# Patient Record
Sex: Female | Born: 1935 | Race: White | Hispanic: No | Smoking: Never smoker
Health system: Southern US, Community
[De-identification: ages and names within clinical notes are randomized; demographics above are authoritative.]

## PROBLEM LIST (undated history)

## (undated) DIAGNOSIS — L719 Rosacea, unspecified: Secondary | ICD-10-CM

## (undated) DIAGNOSIS — U071 COVID-19: Secondary | ICD-10-CM

## (undated) DIAGNOSIS — I2699 Other pulmonary embolism without acute cor pulmonale: Secondary | ICD-10-CM

## (undated) DIAGNOSIS — H9319 Tinnitus, unspecified ear: Secondary | ICD-10-CM

## (undated) DIAGNOSIS — N183 Chronic kidney disease, stage 3 unspecified: Secondary | ICD-10-CM

## (undated) DIAGNOSIS — F329 Major depressive disorder, single episode, unspecified: Secondary | ICD-10-CM

## (undated) DIAGNOSIS — F32A Depression, unspecified: Secondary | ICD-10-CM

## (undated) DIAGNOSIS — I10 Essential (primary) hypertension: Secondary | ICD-10-CM

## (undated) DIAGNOSIS — M8580 Other specified disorders of bone density and structure, unspecified site: Secondary | ICD-10-CM

## (undated) DIAGNOSIS — J984 Other disorders of lung: Secondary | ICD-10-CM

## (undated) DIAGNOSIS — K279 Peptic ulcer, site unspecified, unspecified as acute or chronic, without hemorrhage or perforation: Secondary | ICD-10-CM

## (undated) DIAGNOSIS — B029 Zoster without complications: Secondary | ICD-10-CM

## (undated) HISTORY — DX: Major depressive disorder, single episode, unspecified: F32.9

## (undated) HISTORY — DX: Peptic ulcer, site unspecified, unspecified as acute or chronic, without hemorrhage or perforation: K27.9

## (undated) HISTORY — DX: Tinnitus, unspecified ear: H93.19

## (undated) HISTORY — DX: Depression, unspecified: F32.A

## (undated) HISTORY — PX: OTHER SURGICAL HISTORY: SHX169

## (undated) HISTORY — DX: Chronic kidney disease, stage 3 unspecified: N18.30

## (undated) HISTORY — DX: Other specified disorders of bone density and structure, unspecified site: M85.80

## (undated) HISTORY — DX: Rosacea, unspecified: L71.9

## (undated) HISTORY — DX: Chronic kidney disease, stage 3 (moderate): N18.3

## (undated) HISTORY — DX: Other disorders of lung: J98.4

## (undated) HISTORY — DX: Zoster without complications: B02.9

---
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
## 1997-10-20 ENCOUNTER — Other Ambulatory Visit: Admission: RE | Admit: 1997-10-20 | Discharge: 1997-10-20 | Payer: Self-pay | Admitting: Internal Medicine

## 1997-10-21 ENCOUNTER — Other Ambulatory Visit: Admission: RE | Admit: 1997-10-21 | Discharge: 1997-10-21 | Payer: Self-pay | Admitting: Internal Medicine

## 1999-10-15 ENCOUNTER — Other Ambulatory Visit: Admission: RE | Admit: 1999-10-15 | Discharge: 1999-10-15 | Payer: Self-pay | Admitting: Internal Medicine

## 2001-01-27 ENCOUNTER — Ambulatory Visit (HOSPITAL_COMMUNITY): Admission: RE | Admit: 2001-01-27 | Discharge: 2001-01-27 | Payer: Self-pay | Admitting: Gastroenterology

## 2001-11-25 ENCOUNTER — Other Ambulatory Visit: Admission: RE | Admit: 2001-11-25 | Discharge: 2001-11-25 | Payer: Self-pay | Admitting: Internal Medicine

## 2003-10-17 ENCOUNTER — Emergency Department (HOSPITAL_COMMUNITY): Admission: EM | Admit: 2003-10-17 | Discharge: 2003-10-17 | Payer: Self-pay | Admitting: Emergency Medicine

## 2005-03-15 ENCOUNTER — Emergency Department (HOSPITAL_COMMUNITY): Admission: EM | Admit: 2005-03-15 | Discharge: 2005-03-16 | Payer: Self-pay | Admitting: Emergency Medicine

## 2011-07-30 DIAGNOSIS — R269 Unspecified abnormalities of gait and mobility: Secondary | ICD-10-CM | POA: Diagnosis not present

## 2011-07-30 DIAGNOSIS — I10 Essential (primary) hypertension: Secondary | ICD-10-CM | POA: Diagnosis not present

## 2011-07-30 DIAGNOSIS — M810 Age-related osteoporosis without current pathological fracture: Secondary | ICD-10-CM | POA: Diagnosis not present

## 2011-07-30 DIAGNOSIS — J84112 Idiopathic pulmonary fibrosis: Secondary | ICD-10-CM | POA: Diagnosis not present

## 2011-08-22 ENCOUNTER — Ambulatory Visit: Payer: Medicare Other | Attending: Internal Medicine | Admitting: Physical Therapy

## 2011-08-22 DIAGNOSIS — R279 Unspecified lack of coordination: Secondary | ICD-10-CM | POA: Diagnosis not present

## 2011-08-22 DIAGNOSIS — IMO0001 Reserved for inherently not codable concepts without codable children: Secondary | ICD-10-CM | POA: Diagnosis not present

## 2011-08-22 DIAGNOSIS — R262 Difficulty in walking, not elsewhere classified: Secondary | ICD-10-CM | POA: Insufficient documentation

## 2011-08-22 DIAGNOSIS — R5381 Other malaise: Secondary | ICD-10-CM | POA: Insufficient documentation

## 2011-08-26 ENCOUNTER — Ambulatory Visit: Payer: Medicare Other | Admitting: Physical Therapy

## 2011-08-28 ENCOUNTER — Ambulatory Visit: Payer: Medicare Other

## 2011-09-02 ENCOUNTER — Ambulatory Visit: Payer: Medicare Other | Attending: Internal Medicine

## 2011-09-02 DIAGNOSIS — R262 Difficulty in walking, not elsewhere classified: Secondary | ICD-10-CM | POA: Insufficient documentation

## 2011-09-02 DIAGNOSIS — IMO0001 Reserved for inherently not codable concepts without codable children: Secondary | ICD-10-CM | POA: Insufficient documentation

## 2011-09-02 DIAGNOSIS — R5381 Other malaise: Secondary | ICD-10-CM | POA: Insufficient documentation

## 2011-09-04 ENCOUNTER — Ambulatory Visit: Payer: Medicare Other

## 2011-09-04 DIAGNOSIS — IMO0001 Reserved for inherently not codable concepts without codable children: Secondary | ICD-10-CM | POA: Diagnosis not present

## 2011-09-04 DIAGNOSIS — R5381 Other malaise: Secondary | ICD-10-CM | POA: Diagnosis not present

## 2011-09-04 DIAGNOSIS — R262 Difficulty in walking, not elsewhere classified: Secondary | ICD-10-CM | POA: Diagnosis not present

## 2011-09-09 ENCOUNTER — Ambulatory Visit: Payer: Medicare Other

## 2011-09-09 DIAGNOSIS — IMO0001 Reserved for inherently not codable concepts without codable children: Secondary | ICD-10-CM | POA: Diagnosis not present

## 2011-09-09 DIAGNOSIS — R5381 Other malaise: Secondary | ICD-10-CM | POA: Diagnosis not present

## 2011-09-09 DIAGNOSIS — R262 Difficulty in walking, not elsewhere classified: Secondary | ICD-10-CM | POA: Diagnosis not present

## 2011-09-11 ENCOUNTER — Ambulatory Visit: Payer: Medicare Other

## 2011-09-11 DIAGNOSIS — R262 Difficulty in walking, not elsewhere classified: Secondary | ICD-10-CM | POA: Diagnosis not present

## 2011-09-11 DIAGNOSIS — R5381 Other malaise: Secondary | ICD-10-CM | POA: Diagnosis not present

## 2011-09-11 DIAGNOSIS — IMO0001 Reserved for inherently not codable concepts without codable children: Secondary | ICD-10-CM | POA: Diagnosis not present

## 2011-09-30 DIAGNOSIS — R21 Rash and other nonspecific skin eruption: Secondary | ICD-10-CM | POA: Diagnosis not present

## 2011-09-30 DIAGNOSIS — I10 Essential (primary) hypertension: Secondary | ICD-10-CM | POA: Diagnosis not present

## 2011-09-30 DIAGNOSIS — J84112 Idiopathic pulmonary fibrosis: Secondary | ICD-10-CM | POA: Diagnosis not present

## 2011-09-30 DIAGNOSIS — N183 Chronic kidney disease, stage 3 unspecified: Secondary | ICD-10-CM | POA: Diagnosis not present

## 2011-11-11 DIAGNOSIS — Z961 Presence of intraocular lens: Secondary | ICD-10-CM | POA: Diagnosis not present

## 2011-11-11 DIAGNOSIS — H52209 Unspecified astigmatism, unspecified eye: Secondary | ICD-10-CM | POA: Diagnosis not present

## 2011-11-11 DIAGNOSIS — H43819 Vitreous degeneration, unspecified eye: Secondary | ICD-10-CM | POA: Diagnosis not present

## 2011-11-11 DIAGNOSIS — H04129 Dry eye syndrome of unspecified lacrimal gland: Secondary | ICD-10-CM | POA: Diagnosis not present

## 2012-01-06 DIAGNOSIS — I10 Essential (primary) hypertension: Secondary | ICD-10-CM | POA: Diagnosis not present

## 2012-01-06 DIAGNOSIS — J84112 Idiopathic pulmonary fibrosis: Secondary | ICD-10-CM | POA: Diagnosis not present

## 2012-01-06 DIAGNOSIS — L719 Rosacea, unspecified: Secondary | ICD-10-CM | POA: Diagnosis not present

## 2012-03-14 ENCOUNTER — Other Ambulatory Visit: Payer: Self-pay

## 2012-03-14 ENCOUNTER — Encounter (HOSPITAL_COMMUNITY): Payer: Self-pay | Admitting: *Deleted

## 2012-03-14 ENCOUNTER — Emergency Department (HOSPITAL_COMMUNITY)
Admission: EM | Admit: 2012-03-14 | Discharge: 2012-03-14 | Disposition: A | Discharge status: Home / Self Care | Payer: Medicare Other | Attending: Emergency Medicine | Admitting: Emergency Medicine

## 2012-03-14 DIAGNOSIS — I4949 Other premature depolarization: Secondary | ICD-10-CM | POA: Insufficient documentation

## 2012-03-14 DIAGNOSIS — R42 Dizziness and giddiness: Secondary | ICD-10-CM | POA: Diagnosis not present

## 2012-03-14 DIAGNOSIS — I440 Atrioventricular block, first degree: Secondary | ICD-10-CM

## 2012-03-14 DIAGNOSIS — R5381 Other malaise: Secondary | ICD-10-CM | POA: Diagnosis not present

## 2012-03-14 DIAGNOSIS — I493 Ventricular premature depolarization: Secondary | ICD-10-CM

## 2012-03-14 DIAGNOSIS — R5383 Other fatigue: Secondary | ICD-10-CM | POA: Diagnosis not present

## 2012-03-14 HISTORY — DX: Essential (primary) hypertension: I10

## 2012-03-14 LAB — POCT I-STAT TROPONIN I: Troponin i, poc: 0 ng/mL (ref 0.00–0.08)

## 2012-03-14 LAB — CBC WITH DIFFERENTIAL/PLATELET
Basophils Absolute: 0 10*3/uL (ref 0.0–0.1)
Basophils Relative: 1 % (ref 0–1)
Eosinophils Relative: 1 % (ref 0–5)
Hemoglobin: 15.5 g/dL — ABNORMAL HIGH (ref 12.0–15.0)
Lymphs Abs: 1.4 10*3/uL (ref 0.7–4.0)
MCH: 32.2 pg (ref 26.0–34.0)
MCHC: 34.8 g/dL (ref 30.0–36.0)
MCV: 92.7 fL (ref 78.0–100.0)
Monocytes Absolute: 0.5 10*3/uL (ref 0.1–1.0)
Monocytes Relative: 8 % (ref 3–12)
RBC: 4.81 MIL/uL (ref 3.87–5.11)

## 2012-03-14 LAB — COMPREHENSIVE METABOLIC PANEL
ALT: 15 U/L (ref 0–35)
AST: 23 U/L (ref 0–37)
CO2: 27 mEq/L (ref 19–32)
Calcium: 10.3 mg/dL (ref 8.4–10.5)
Creatinine, Ser: 0.88 mg/dL (ref 0.50–1.10)
GFR calc Af Amer: 66 mL/min — ABNORMAL LOW (ref >90)
GFR calc non Af Amer: 57 mL/min — ABNORMAL LOW (ref >90)
Glucose, Bld: 114 mg/dL — ABNORMAL HIGH (ref 70–99)
Sodium: 142 mEq/L (ref 135–145)
Total Protein: 7.7 g/dL (ref 6.0–8.3)

## 2012-03-14 NOTE — ED Notes (Signed)
Patient sent to ED from [facility] for further evaluation irregular heart rate.  Patient states that she was sitting and when she got up she became dizzy and lightheaded.  Patient denies any SOB, CP, or diaphoresis.  Alert and oriented x 3.  Patient states that she just doesn't feel like herself.

## 2012-03-14 NOTE — ED Notes (Signed)
Pt denies slurred speech & weakness during dizzy episode, pt denies cardiac hx & does not take blood thinners, pt A&O x4, follows commands, speaks in complete sentences

## 2012-03-14 NOTE — ED Provider Notes (Signed)
History     CSN: [id number]  Arrival date & time 03/14/12  1724   First MD Initiated Contact with Patient 03/14/12 2017      Chief Complaint  Patient presents with  . Dizziness   Patient is 78 year old female with past medical history of hypertension who presents to emergency department with complaints of lightheadedness. Patient states around 9:30 this morning she got up from the chair, and after doing so she began having a sensation of lightheadedness. She denies any chest pain, shortness of breath, headache, blurry vision, or any other symptoms.  She reports sitting down and her lightheadedness resolved after approximately one hour. Brother came over to patient's house, and after hearing about event they decided to go to outpatient clinic. There EKG showed sinus Rhythm, with first degree AV block, occasional PVCs. She was sent to the emergency department for further evaluation.  Upon arrival in the emergency department patient continued to be without complaints.    (Consider location/radiation/quality/duration/timing/severity/associated sxs/prior treatment) Patient is a 78 y.o. female presenting with neurologic complaint. The history is provided by the patient and a relative. No language interpreter was used.  Neurologic Problem The primary symptoms include dizziness. The symptoms began 6 to 12 hours ago. The symptoms are resolved.  She describes the dizziness as lightheadedness. The dizziness began today. The dizziness has been resolved since its onset.  Medical issues also include hypertension. Medical issues do not include seizures, cancer, alcohol use or recent surgery. Workup history does not include MRI or CT scan.    Past Medical History  Diagnosis Date  . Hypertension     History reviewed. No pertinent past surgical history.  History reviewed. No pertinent family history.  History  Substance Use Topics  . Smoking status: Never Smoker   . Smokeless tobacco: Not on file    . Alcohol Use: No    OB History    Grav Para Term Preterm Abortions TAB SAB Ect Mult Living                  Review of Systems  Neurological: Positive for dizziness.  All other systems reviewed and are negative.    Allergies  Review of patient's allergies indicates no known allergies.  Home Medications   Current Outpatient Rx  Name Route Sig Dispense Refill  . AMLODIPINE BESYLATE PO Oral Take 1 tablet by mouth daily.    . ATORVASTATIN CALCIUM 40 MG PO TABS Oral Take 40 mg by mouth daily.    Marland Kitchen CALCIUM PO Oral Take 1 tablet by mouth daily.    Marland Kitchen VITAMIN D PO Oral Take 1 tablet by mouth daily.    . CENTRUM SILVER ADULT 50+ PO TABS Oral Take 1 tablet by mouth daily.      BP 170/80  Pulse 60  Temp 97.6 F (36.4 C) (Oral)  Resp 16  SpO2 97%  Physical Exam  Nursing note and vitals reviewed. Constitutional: She is oriented to person, place, and time. She appears well-developed and well-nourished. No distress.  HENT:  Head: Normocephalic and atraumatic.  Right Ear: External ear normal.  Left Ear: External ear normal.  Nose: Nose normal.  Mouth/Throat: Oropharynx is clear and moist.  Eyes: Conjunctivae normal and EOM are normal. Pupils are equal, round, and reactive to light.  Neck: Normal range of motion. Neck supple. No JVD present. No tracheal deviation present. No thyromegaly present.  Cardiovascular: Normal rate, regular rhythm, normal heart sounds and intact distal pulses.   Pulmonary/Chest: Effort normal  and breath sounds normal. No stridor. No respiratory distress. She has no wheezes. She has no rales. She exhibits no tenderness.  Abdominal: Soft.  Musculoskeletal: Normal range of motion. She exhibits no edema and no tenderness.  Lymphadenopathy:    She has no cervical adenopathy.  Neurological: She is alert and oriented to person, place, and time. She has normal reflexes.  Skin: Skin is warm and dry.  Psychiatric: She has a normal mood and affect.    ED  Course  Procedures (including critical care time)  Labs Reviewed  CBC WITH DIFFERENTIAL - Abnormal; Notable for the following:    Hemoglobin 15.5 (*)     All other components within normal limits  COMPREHENSIVE METABOLIC PANEL - Abnormal; Notable for the following:    Glucose, Bld 114 (*)     GFR calc non Af Amer 57 (*)     GFR calc Af Amer 66 (*)     All other components within normal limits  POCT I-STAT TROPONIN I   No results found.   Date: 03/14/2012  Rate: 64  Rhythm: normal sinus rhythm  QRS Axis: normal  Intervals: PR prolonged  ST/T Wave abnormalities: normal  Conduction Disutrbances:first-degree A-V block   Narrative Interpretation:   Old EKG Reviewed: On 03/15/2005 EKG shows occasional PVC but none on today's   1. Lightheadedness   2. First degree AV block   3. PVC's (premature ventricular contractions)       MDM    Patient is 78 year old female with past medical history of hypertension who presents to emergency department with one episode of lightheadedness. Of note, patient was seen in clinic and the outside EKG showed PVCs and thus she was sent to the emergency department.  Upon arrival in the emergency department patient was without symptoms.  Afebrile and vital signs remarkable only for mildly elevated blood pressure of 173/92.  On exam patient overall appeared well, well hydrated, and no focal neurological deficits.  Able to ambulate without difficulty.  Review of labs obtained in triage including troponin within normal limits. EKG here shows normal sinus rhythm with first degree AV block. No PVCs were noted. With patient being currently asymptomatic, unremarkable vital signs, and reassuring physiccal exam it was felt she was safe for discharge with PCP followup. Patient sees Dr. Sharlyn Bologna, and she will see him as soon as able for follow up.        Johnney Ou, MD 03/15/12 [phone]

## 2012-03-14 NOTE — ED Provider Notes (Signed)
Patient complains of general malaise from 9 AM to 10 AM today. Denied shortness of breath nausea sweatiness or headache she is presently asymptomatic on exam no distress alert lungs clear auscultation heart regular rate and rhythm abdomen nondistended neurologic Glasgow Coma Score 15 moves all extremities gait normal cranial nerves II through XII intact  Doug Sou, MD 03/14/12 2056

## 2012-03-15 NOTE — ED Provider Notes (Signed)
I have personally seen and examined the patient.  I have discussed the plan of care with the resident.  I have reviewed the documentation on PMH/FH/Soc. History.  I have reviewed the documentation of the resident and agree.  Jamee Keach, MD 03/15/12 1457 

## 2012-03-18 DIAGNOSIS — R42 Dizziness and giddiness: Secondary | ICD-10-CM | POA: Diagnosis not present

## 2012-04-14 DIAGNOSIS — Z23 Encounter for immunization: Secondary | ICD-10-CM | POA: Diagnosis not present

## 2012-05-05 DIAGNOSIS — R269 Unspecified abnormalities of gait and mobility: Secondary | ICD-10-CM | POA: Diagnosis not present

## 2012-05-05 DIAGNOSIS — I10 Essential (primary) hypertension: Secondary | ICD-10-CM | POA: Diagnosis not present

## 2012-05-05 DIAGNOSIS — Z1331 Encounter for screening for depression: Secondary | ICD-10-CM | POA: Diagnosis not present

## 2012-05-05 DIAGNOSIS — N183 Chronic kidney disease, stage 3 unspecified: Secondary | ICD-10-CM | POA: Diagnosis not present

## 2012-05-05 DIAGNOSIS — Z0000 Encounter for general adult medical examination without abnormal findings: Secondary | ICD-10-CM | POA: Diagnosis not present

## 2012-09-07 DIAGNOSIS — R269 Unspecified abnormalities of gait and mobility: Secondary | ICD-10-CM | POA: Diagnosis not present

## 2012-09-07 DIAGNOSIS — I10 Essential (primary) hypertension: Secondary | ICD-10-CM | POA: Diagnosis not present

## 2012-09-14 ENCOUNTER — Encounter: Payer: Federal, State, Local not specified - PPO | Admitting: Physical Therapy

## 2012-09-15 ENCOUNTER — Ambulatory Visit: Payer: Medicare Other | Admitting: Physical Therapy

## 2012-09-23 ENCOUNTER — Ambulatory Visit: Payer: Medicare Other | Attending: Internal Medicine | Admitting: Physical Therapy

## 2012-09-23 DIAGNOSIS — IMO0001 Reserved for inherently not codable concepts without codable children: Secondary | ICD-10-CM | POA: Diagnosis not present

## 2012-09-23 DIAGNOSIS — R269 Unspecified abnormalities of gait and mobility: Secondary | ICD-10-CM | POA: Diagnosis not present

## 2012-09-28 ENCOUNTER — Ambulatory Visit: Payer: Medicare Other | Admitting: Physical Therapy

## 2012-09-28 DIAGNOSIS — R269 Unspecified abnormalities of gait and mobility: Secondary | ICD-10-CM | POA: Diagnosis not present

## 2012-09-28 DIAGNOSIS — IMO0001 Reserved for inherently not codable concepts without codable children: Secondary | ICD-10-CM | POA: Diagnosis not present

## 2012-09-30 ENCOUNTER — Ambulatory Visit: Payer: Medicare Other | Attending: Internal Medicine | Admitting: Physical Therapy

## 2012-09-30 DIAGNOSIS — R269 Unspecified abnormalities of gait and mobility: Secondary | ICD-10-CM | POA: Diagnosis not present

## 2012-09-30 DIAGNOSIS — IMO0001 Reserved for inherently not codable concepts without codable children: Secondary | ICD-10-CM | POA: Insufficient documentation

## 2012-10-05 ENCOUNTER — Ambulatory Visit: Payer: Medicare Other | Admitting: Physical Therapy

## 2012-10-05 DIAGNOSIS — R269 Unspecified abnormalities of gait and mobility: Secondary | ICD-10-CM | POA: Diagnosis not present

## 2012-10-05 DIAGNOSIS — IMO0001 Reserved for inherently not codable concepts without codable children: Secondary | ICD-10-CM | POA: Diagnosis not present

## 2012-10-09 ENCOUNTER — Ambulatory Visit: Payer: Medicare Other | Admitting: Physical Therapy

## 2012-10-09 DIAGNOSIS — IMO0001 Reserved for inherently not codable concepts without codable children: Secondary | ICD-10-CM | POA: Diagnosis not present

## 2012-10-09 DIAGNOSIS — R269 Unspecified abnormalities of gait and mobility: Secondary | ICD-10-CM | POA: Diagnosis not present

## 2012-10-12 ENCOUNTER — Ambulatory Visit: Payer: Medicare Other | Admitting: Physical Therapy

## 2012-10-12 DIAGNOSIS — R269 Unspecified abnormalities of gait and mobility: Secondary | ICD-10-CM | POA: Diagnosis not present

## 2012-10-12 DIAGNOSIS — IMO0001 Reserved for inherently not codable concepts without codable children: Secondary | ICD-10-CM | POA: Diagnosis not present

## 2012-10-14 ENCOUNTER — Ambulatory Visit: Payer: Medicare Other | Admitting: Physical Therapy

## 2012-10-14 DIAGNOSIS — R269 Unspecified abnormalities of gait and mobility: Secondary | ICD-10-CM | POA: Diagnosis not present

## 2012-10-14 DIAGNOSIS — IMO0001 Reserved for inherently not codable concepts without codable children: Secondary | ICD-10-CM | POA: Diagnosis not present

## 2012-10-19 ENCOUNTER — Ambulatory Visit: Payer: Medicare Other | Admitting: Physical Therapy

## 2012-10-19 DIAGNOSIS — R269 Unspecified abnormalities of gait and mobility: Secondary | ICD-10-CM | POA: Diagnosis not present

## 2012-10-19 DIAGNOSIS — IMO0001 Reserved for inherently not codable concepts without codable children: Secondary | ICD-10-CM | POA: Diagnosis not present

## 2012-10-20 DIAGNOSIS — Z1231 Encounter for screening mammogram for malignant neoplasm of breast: Secondary | ICD-10-CM | POA: Diagnosis not present

## 2012-10-20 DIAGNOSIS — Z803 Family history of malignant neoplasm of breast: Secondary | ICD-10-CM | POA: Diagnosis not present

## 2012-10-21 ENCOUNTER — Encounter: Payer: Federal, State, Local not specified - PPO | Admitting: Physical Therapy

## 2012-11-03 DIAGNOSIS — I472 Ventricular tachycardia: Secondary | ICD-10-CM | POA: Diagnosis not present

## 2012-11-03 DIAGNOSIS — E039 Hypothyroidism, unspecified: Secondary | ICD-10-CM | POA: Diagnosis not present

## 2012-11-03 DIAGNOSIS — I471 Supraventricular tachycardia: Secondary | ICD-10-CM | POA: Diagnosis not present

## 2012-11-03 DIAGNOSIS — R002 Palpitations: Secondary | ICD-10-CM | POA: Diagnosis not present

## 2012-11-03 DIAGNOSIS — I10 Essential (primary) hypertension: Secondary | ICD-10-CM | POA: Diagnosis not present

## 2012-11-03 DIAGNOSIS — R55 Syncope and collapse: Secondary | ICD-10-CM | POA: Diagnosis not present

## 2012-11-03 DIAGNOSIS — I499 Cardiac arrhythmia, unspecified: Secondary | ICD-10-CM | POA: Diagnosis not present

## 2012-11-13 DIAGNOSIS — H52209 Unspecified astigmatism, unspecified eye: Secondary | ICD-10-CM | POA: Diagnosis not present

## 2012-11-13 DIAGNOSIS — H43819 Vitreous degeneration, unspecified eye: Secondary | ICD-10-CM | POA: Diagnosis not present

## 2012-11-13 DIAGNOSIS — Z961 Presence of intraocular lens: Secondary | ICD-10-CM | POA: Diagnosis not present

## 2012-12-07 DIAGNOSIS — I471 Supraventricular tachycardia: Secondary | ICD-10-CM | POA: Diagnosis not present

## 2012-12-07 DIAGNOSIS — N183 Chronic kidney disease, stage 3 unspecified: Secondary | ICD-10-CM | POA: Diagnosis not present

## 2012-12-07 DIAGNOSIS — I10 Essential (primary) hypertension: Secondary | ICD-10-CM | POA: Diagnosis not present

## 2012-12-07 DIAGNOSIS — J84112 Idiopathic pulmonary fibrosis: Secondary | ICD-10-CM | POA: Diagnosis not present

## 2013-03-20 DIAGNOSIS — Z23 Encounter for immunization: Secondary | ICD-10-CM | POA: Diagnosis not present

## 2013-05-20 DIAGNOSIS — E782 Mixed hyperlipidemia: Secondary | ICD-10-CM | POA: Diagnosis not present

## 2013-05-20 DIAGNOSIS — I10 Essential (primary) hypertension: Secondary | ICD-10-CM | POA: Diagnosis not present

## 2013-05-20 DIAGNOSIS — J84112 Idiopathic pulmonary fibrosis: Secondary | ICD-10-CM | POA: Diagnosis not present

## 2013-05-20 DIAGNOSIS — I471 Supraventricular tachycardia: Secondary | ICD-10-CM | POA: Diagnosis not present

## 2013-05-20 DIAGNOSIS — N183 Chronic kidney disease, stage 3 unspecified: Secondary | ICD-10-CM | POA: Diagnosis not present

## 2013-05-20 DIAGNOSIS — M810 Age-related osteoporosis without current pathological fracture: Secondary | ICD-10-CM | POA: Diagnosis not present

## 2013-05-20 DIAGNOSIS — Z0000 Encounter for general adult medical examination without abnormal findings: Secondary | ICD-10-CM | POA: Diagnosis not present

## 2013-05-20 DIAGNOSIS — Z1331 Encounter for screening for depression: Secondary | ICD-10-CM | POA: Diagnosis not present

## 2013-09-01 DIAGNOSIS — M545 Low back pain, unspecified: Secondary | ICD-10-CM | POA: Diagnosis not present

## 2013-09-10 ENCOUNTER — Other Ambulatory Visit: Payer: Self-pay | Admitting: Internal Medicine

## 2013-09-10 ENCOUNTER — Ambulatory Visit
Admission: RE | Admit: 2013-09-10 | Discharge: 2013-09-10 | Disposition: A | Discharge status: Home / Self Care | Payer: Medicare Other | Source: Ambulatory Visit | Attending: Internal Medicine | Admitting: Internal Medicine

## 2013-09-10 DIAGNOSIS — M545 Low back pain, unspecified: Secondary | ICD-10-CM

## 2013-09-10 DIAGNOSIS — M4120 Other idiopathic scoliosis, site unspecified: Secondary | ICD-10-CM | POA: Diagnosis not present

## 2013-09-10 DIAGNOSIS — M5137 Other intervertebral disc degeneration, lumbosacral region: Secondary | ICD-10-CM | POA: Diagnosis not present

## 2013-09-20 ENCOUNTER — Other Ambulatory Visit: Payer: Self-pay | Admitting: Internal Medicine

## 2013-09-20 DIAGNOSIS — J84112 Idiopathic pulmonary fibrosis: Secondary | ICD-10-CM | POA: Diagnosis not present

## 2013-09-20 DIAGNOSIS — M810 Age-related osteoporosis without current pathological fracture: Secondary | ICD-10-CM | POA: Diagnosis not present

## 2013-09-20 DIAGNOSIS — IMO0002 Reserved for concepts with insufficient information to code with codable children: Secondary | ICD-10-CM | POA: Diagnosis not present

## 2013-09-20 DIAGNOSIS — I10 Essential (primary) hypertension: Secondary | ICD-10-CM | POA: Diagnosis not present

## 2013-09-20 DIAGNOSIS — M545 Low back pain, unspecified: Secondary | ICD-10-CM

## 2013-09-20 DIAGNOSIS — N183 Chronic kidney disease, stage 3 unspecified: Secondary | ICD-10-CM | POA: Diagnosis not present

## 2013-09-20 DIAGNOSIS — S32009A Unspecified fracture of unspecified lumbar vertebra, initial encounter for closed fracture: Secondary | ICD-10-CM | POA: Diagnosis not present

## 2013-09-24 ENCOUNTER — Ambulatory Visit
Admission: RE | Admit: 2013-09-24 | Discharge: 2013-09-24 | Disposition: A | Discharge status: Home / Self Care | Payer: Medicare Other | Source: Ambulatory Visit | Attending: Internal Medicine | Admitting: Internal Medicine

## 2013-09-24 DIAGNOSIS — S322XXA Fracture of coccyx, initial encounter for closed fracture: Secondary | ICD-10-CM | POA: Diagnosis not present

## 2013-09-24 DIAGNOSIS — S3210XA Unspecified fracture of sacrum, initial encounter for closed fracture: Secondary | ICD-10-CM | POA: Diagnosis not present

## 2013-09-24 DIAGNOSIS — M545 Low back pain, unspecified: Secondary | ICD-10-CM

## 2013-11-04 DIAGNOSIS — M810 Age-related osteoporosis without current pathological fracture: Secondary | ICD-10-CM | POA: Diagnosis not present

## 2013-11-06 ENCOUNTER — Other Ambulatory Visit: Payer: Self-pay | Admitting: Interventional Cardiology

## 2013-12-06 ENCOUNTER — Encounter: Payer: Self-pay | Admitting: Cardiology

## 2013-12-06 ENCOUNTER — Ambulatory Visit (INDEPENDENT_AMBULATORY_CARE_PROVIDER_SITE_OTHER): Payer: Medicare Other | Admitting: Interventional Cardiology

## 2013-12-06 ENCOUNTER — Encounter: Payer: Self-pay | Admitting: Interventional Cardiology

## 2013-12-06 VITALS — BP 168/81 | HR 72 | Ht 60.0 in | Wt 130.8 lb

## 2013-12-06 DIAGNOSIS — I10 Essential (primary) hypertension: Secondary | ICD-10-CM | POA: Insufficient documentation

## 2013-12-06 DIAGNOSIS — R609 Edema, unspecified: Secondary | ICD-10-CM

## 2013-12-06 DIAGNOSIS — I471 Supraventricular tachycardia: Secondary | ICD-10-CM

## 2013-12-06 DIAGNOSIS — R55 Syncope and collapse: Secondary | ICD-10-CM

## 2013-12-06 MED ORDER — METOPROLOL TARTRATE 25 MG PO TABS: ORAL_TABLET | ORAL | Status: DC

## 2013-12-06 NOTE — Progress Notes (Signed)
Patient ID: Madeline Parrish, female   DOB: Nov 14, 1921, 80 y.o.   MRN: [id number]    [address] [place], [place]  [address] Phone: [phone] Fax:  [phone]  Date:  12/06/2013   ID:  Madeline Parrish, DOB [birth year], MRN [id number]  PCP:  Darnelle Bos, MD      History of Present Illness: Madeline Parrish is a 80 y.o. female who has a history of questionable SVT in the past.  In 2014, She was having palpitations and her primary care doctor sent her up for Holter monitor. When being hooked up with a Holter monitor, it was noted that her heart rate was 170. An ECG was performed which revealed a regular tachycardia with a heart rate in the 170s. The patient could feel her fast heartbeat. She denied any chest pain. She was not really short of breath. Given that she would not likely tolerate this heart rate for a prolonged period of time, we elected to have her sent to the emergency room. She got to the EMS stretcher and then while still in the office, felt markedly better. Repeat ECG showed that her SVT had broken and she was now in a sinus tachycardia. We gave her 5 mg of by systolic and had her walk around the office. Her heart rate stayed in the normal range. She had no chest pain or shortness of breath. Overall she felt well at this episode described in 2014.  Since that time, she has done well on metoprolol.  No SVT.  She has fallen due to some wewakness, but not related to the HR.  She has LE edema.  It is worse since she has had some back pain.  Left is more swollen than right.    Wt Readings from Last 3 Encounters:  12/06/13 130 lb 12.8 oz (59.33 kg)     Past Medical History  Diagnosis Date  . Hypertension   . Shingles   . Depression   . PUD (peptic ulcer disease)   . Acne rosacea   . Osteopenia   . Tinnitus     and vertigo  . CKD (chronic kidney disease), stage III   . Restrictive lung disease     Current Outpatient Prescriptions  Medication  Sig Dispense Refill  . AMLODIPINE BESYLATE PO Take 2.5 mg by mouth daily.       Marland Kitchen atorvastatin (LIPITOR) 40 MG tablet Take 40 mg by mouth daily.      Marland Kitchen CALCIUM PO Take 1 tablet by mouth daily.      . Cholecalciferol (VITAMIN D PO) Take 1,000 Units by mouth daily.       . clindamycin (CLEOCIN T) 1 % lotion Apply topically.       Marland Kitchen HYDROcodone-acetaminophen (NORCO/VICODIN) 5-325 MG per tablet Take 1 tablet by mouth every 6 (six) hours as needed.       Marland Kitchen lisinopril (PRINIVIL,ZESTRIL) 40 MG tablet Take 40 mg by mouth daily.       . metoprolol tartrate (LOPRESSOR) 25 MG tablet TAKE 1 TABLET TWICE DAILY.  60 tablet  11  . Multiple Vitamins-Minerals (CENTRUM SILVER ADULT 50+) TABS Take 1 tablet by mouth daily.      . Omega-3 Fatty Acids (FISH OIL) 1000 MG CAPS Take by mouth.       No current facility-administered medications for this visit.    Allergies:   No Known Allergies  Social History:  The patient  reports that she has never  smoked. She does not have any smokeless tobacco history on file. She reports that she does not drink alcohol.   Family History:  The patient's family history includes CVA in her mother.   ROS:  Please see the history of present illness.  No nausea, vomiting.  No fevers, chills.  No focal weakness.  No dysuria. Falls, swelling as above.   All other systems reviewed and negative.   PHYSICAL EXAM: VS:  BP 168/81  Pulse 72  Ht 5' (1.524 m)  Wt 130 lb 12.8 oz (59.33 kg)  BMI 25.54 kg/m2 Well nourished, well developed, in no acute distress HEENT: normal Neck: no JVD, no carotid bruits Cardiac:  normal S1, S2; RRR;  Lungs:  clear to auscultation bilaterally, no wheezing, rhonchi or rales Abd: soft, nontender, no hepatomegaly Ext: no edema Skin: warm and dry Neuro:   no focal abnormalities noted  EKG:  NSR with prolonged PR   ASSESSMENT AND PLAN:  SVT/PAT  Continue Metoprolol Tartrate Tablet, 25 MG, 1 tablet, Orally, Twice a day, 30 day(s), 60, Refills  11 Notes: SVT with significantly elevated heart rate. She tolerates this quite well but likely would not if it was prolonged. Started beta blocker as noted above. If she has recurrent symptoms, she will let us know and we'll likely up titrate her beta blocker.  2. Essential hypertension, benign  Notes: if we need to, will decrease lisinopril to allow for more rate slowing drugs. We'll try low-dose metoprolol and titrate as needed.  Check BP outside of doctor's office.  Target < 150/90. 3 Bilateral LE edema.  Elevate legs at night to help.  COuld consider diuretic or compression stockings in the future  Signed, Fredric MareJay S. Varanasi, MD, [facility] 12/06/2013 3:03 PM

## 2013-12-06 NOTE — Patient Instructions (Signed)
Your physician recommends that you continue on your current medications as directed. Please refer to the Current Medication list given to you today.  Your physician wants you to follow-up in: 1 year with Dr. Varanasi. You will receive a reminder letter in the mail two months in advance. If you don't receive a letter, please call our office to schedule the follow-up appointment.  

## 2013-12-23 DIAGNOSIS — M810 Age-related osteoporosis without current pathological fracture: Secondary | ICD-10-CM | POA: Diagnosis not present

## 2013-12-23 DIAGNOSIS — R609 Edema, unspecified: Secondary | ICD-10-CM | POA: Diagnosis not present

## 2013-12-23 DIAGNOSIS — J84112 Idiopathic pulmonary fibrosis: Secondary | ICD-10-CM | POA: Diagnosis not present

## 2013-12-23 DIAGNOSIS — I10 Essential (primary) hypertension: Secondary | ICD-10-CM | POA: Diagnosis not present

## 2014-04-16 DIAGNOSIS — Z23 Encounter for immunization: Secondary | ICD-10-CM | POA: Diagnosis not present

## 2014-05-23 DIAGNOSIS — Z0000 Encounter for general adult medical examination without abnormal findings: Secondary | ICD-10-CM | POA: Diagnosis not present

## 2014-05-23 DIAGNOSIS — Z136 Encounter for screening for cardiovascular disorders: Secondary | ICD-10-CM | POA: Diagnosis not present

## 2014-05-23 DIAGNOSIS — Z1389 Encounter for screening for other disorder: Secondary | ICD-10-CM | POA: Diagnosis not present

## 2014-07-04 DIAGNOSIS — H43813 Vitreous degeneration, bilateral: Secondary | ICD-10-CM | POA: Diagnosis not present

## 2014-07-04 DIAGNOSIS — Z961 Presence of intraocular lens: Secondary | ICD-10-CM | POA: Diagnosis not present

## 2014-07-04 DIAGNOSIS — H52203 Unspecified astigmatism, bilateral: Secondary | ICD-10-CM | POA: Diagnosis not present

## 2014-07-04 DIAGNOSIS — H04123 Dry eye syndrome of bilateral lacrimal glands: Secondary | ICD-10-CM | POA: Diagnosis not present

## 2014-09-20 DIAGNOSIS — J84112 Idiopathic pulmonary fibrosis: Secondary | ICD-10-CM | POA: Diagnosis not present

## 2014-09-20 DIAGNOSIS — N183 Chronic kidney disease, stage 3 (moderate): Secondary | ICD-10-CM | POA: Diagnosis not present

## 2014-09-20 DIAGNOSIS — I10 Essential (primary) hypertension: Secondary | ICD-10-CM | POA: Diagnosis not present

## 2015-01-13 ENCOUNTER — Other Ambulatory Visit: Payer: Self-pay | Admitting: Interventional Cardiology

## 2015-02-20 ENCOUNTER — Other Ambulatory Visit: Payer: Self-pay | Admitting: Interventional Cardiology

## 2015-03-09 ENCOUNTER — Ambulatory Visit: Payer: Federal, State, Local not specified - PPO | Admitting: Interventional Cardiology

## 2015-03-15 ENCOUNTER — Encounter: Payer: Self-pay | Admitting: Interventional Cardiology

## 2015-03-21 DIAGNOSIS — Z23 Encounter for immunization: Secondary | ICD-10-CM | POA: Diagnosis not present

## 2015-03-28 ENCOUNTER — Other Ambulatory Visit: Payer: Self-pay | Admitting: Interventional Cardiology

## 2015-05-04 ENCOUNTER — Other Ambulatory Visit: Payer: Self-pay | Admitting: Interventional Cardiology

## 2015-05-29 ENCOUNTER — Other Ambulatory Visit: Payer: Self-pay

## 2015-05-29 DIAGNOSIS — Z23 Encounter for immunization: Secondary | ICD-10-CM | POA: Diagnosis not present

## 2015-05-29 DIAGNOSIS — E039 Hypothyroidism, unspecified: Secondary | ICD-10-CM | POA: Diagnosis not present

## 2015-05-29 DIAGNOSIS — I471 Supraventricular tachycardia: Secondary | ICD-10-CM | POA: Diagnosis not present

## 2015-05-29 DIAGNOSIS — I10 Essential (primary) hypertension: Secondary | ICD-10-CM | POA: Diagnosis not present

## 2015-05-29 DIAGNOSIS — M810 Age-related osteoporosis without current pathological fracture: Secondary | ICD-10-CM | POA: Diagnosis not present

## 2015-05-29 DIAGNOSIS — Z1389 Encounter for screening for other disorder: Secondary | ICD-10-CM | POA: Diagnosis not present

## 2015-05-29 DIAGNOSIS — N183 Chronic kidney disease, stage 3 (moderate): Secondary | ICD-10-CM | POA: Diagnosis not present

## 2015-05-29 DIAGNOSIS — Z0000 Encounter for general adult medical examination without abnormal findings: Secondary | ICD-10-CM | POA: Diagnosis not present

## 2015-05-29 MED ORDER — METOPROLOL TARTRATE 25 MG PO TABS: 25.0000 mg | ORAL_TABLET | Freq: Two times a day (BID) | ORAL | Status: DC

## 2015-07-17 DIAGNOSIS — H52203 Unspecified astigmatism, bilateral: Secondary | ICD-10-CM | POA: Diagnosis not present

## 2015-07-17 DIAGNOSIS — H04123 Dry eye syndrome of bilateral lacrimal glands: Secondary | ICD-10-CM | POA: Diagnosis not present

## 2015-07-17 DIAGNOSIS — S0502XA Injury of conjunctiva and corneal abrasion without foreign body, left eye, initial encounter: Secondary | ICD-10-CM | POA: Diagnosis not present

## 2015-07-17 DIAGNOSIS — H43813 Vitreous degeneration, bilateral: Secondary | ICD-10-CM | POA: Diagnosis not present

## 2015-08-15 ENCOUNTER — Encounter: Payer: Self-pay | Admitting: Interventional Cardiology

## 2015-08-15 ENCOUNTER — Ambulatory Visit (INDEPENDENT_AMBULATORY_CARE_PROVIDER_SITE_OTHER): Payer: Medicare Other | Admitting: Interventional Cardiology

## 2015-08-15 VITALS — BP 100/60 | HR 83 | Ht 63.0 in | Wt 138.4 lb

## 2015-08-15 DIAGNOSIS — I10 Essential (primary) hypertension: Secondary | ICD-10-CM

## 2015-08-15 DIAGNOSIS — Z13220 Encounter for screening for lipoid disorders: Secondary | ICD-10-CM | POA: Diagnosis not present

## 2015-08-15 DIAGNOSIS — I471 Supraventricular tachycardia: Secondary | ICD-10-CM

## 2015-08-15 DIAGNOSIS — R600 Localized edema: Secondary | ICD-10-CM

## 2015-08-15 DIAGNOSIS — E785 Hyperlipidemia, unspecified: Secondary | ICD-10-CM | POA: Insufficient documentation

## 2015-08-15 LAB — COMPREHENSIVE METABOLIC PANEL
ALBUMIN: 3.4 g/dL — AB (ref 3.6–5.1)
ALT: 12 U/L (ref 6–29)
AST: 20 U/L (ref 10–35)
Alkaline Phosphatase: 47 U/L (ref 33–130)
BUN: 21 mg/dL (ref 7–25)
CHLORIDE: 106 mmol/L (ref 98–110)
CO2: 27 mmol/L (ref 20–31)
CREATININE: 1.07 mg/dL — AB (ref 0.60–0.88)
Calcium: 9.3 mg/dL (ref 8.6–10.4)
Glucose, Bld: 106 mg/dL — ABNORMAL HIGH (ref 65–99)
Potassium: 3.7 mmol/L (ref 3.5–5.3)
SODIUM: 140 mmol/L (ref 135–146)
TOTAL PROTEIN: 6.5 g/dL (ref 6.1–8.1)
Total Bilirubin: 0.5 mg/dL (ref 0.2–1.2)

## 2015-08-15 LAB — LIPID PANEL
Cholesterol: 167 mg/dL (ref 125–200)
HDL: 65 mg/dL (ref >=46)
LDL CALC: 87 mg/dL (ref <130)
Total CHOL/HDL Ratio: 2.6 Ratio (ref <=5.0)
Triglycerides: 76 mg/dL (ref <150)
VLDL: 15 mg/dL (ref <30)

## 2015-08-15 NOTE — Patient Instructions (Signed)
**Note De-identified  Obfuscation** Medication Instructions:  Same-no changes  Labwork: Lipids and CMET today  Testing/Procedures: None  Follow-Up: Your physician wants you to follow-up in: 1 year. You will receive a reminder letter in the mail two months in advance. If you don't receive a letter, please call our office to schedule the follow-up appointment.     If you need a refill on your cardiac medications before your next appointment, please call your pharmacy.   

## 2015-08-15 NOTE — Progress Notes (Signed)
Patient ID: DONDREA CLENDENIN, female   DOB: Dec 07, 1921, 82 y.o.   MRN: [id number]     Cardiology Office Note   Date:  08/15/2015   ID:  Madeline Parrish, DOB [birth year], MRN [id number]  PCP:  Darnelle Bos, MD    Chief Complaint  Patient presents with  . Follow-up    no sx     Wt Readings from Last 3 Encounters:  08/15/15 138 lb 6.4 oz (62.778 kg)  12/06/13 130 lb 12.8 oz (59.33 kg)       History of Present Illness: Madeline Parrish is a 82 y.o. female  who has a history of questionable SVT in the past. In 2014, She was having palpitations and her primary care doctor sent her up for Holter monitor. When being hooked up with a Holter monitor, it was noted that her heart rate was 170. An ECG was performed which revealed a regular tachycardia with a heart rate in the 170s. The patient could feel her fast heartbeat. She denied any chest pain. She was not really short of breath. Given that she would not likely tolerate this heart rate for a prolonged period of time, we elected to have her sent to the emergency room. She got to the EMS stretcher and then while still in the office, felt markedly better. Repeat ECG showed that her SVT had broken and she was now in a sinus tachycardia. We gave her 5 mg of by systolic and had her walk around the office. Her heart rate stayed in the normal range. She had no chest pain or shortness of breath. Overall she felt well at this episode described in 2014.  Since that time, she has done well on metoprolol.  She has occasional episodes of palpitations that resolve after a few minutes.  She sits down and they go away.  Chronic ankle swelling.  She does not do much walking.  She is "housebound."  She no longer drives.  She does not get out and walk.  Her 88 y/o brother takes her to the grocery store.     Past Medical History  Diagnosis Date  . Hypertension   . Shingles   . Depression   . PUD (peptic ulcer disease)   . Acne rosacea   .  Osteopenia   . Tinnitus     and vertigo  . CKD (chronic kidney disease), stage III   . Restrictive lung disease     History reviewed. No pertinent past surgical history.   Current Outpatient Prescriptions  Medication Sig Dispense Refill  . AMLODIPINE BESYLATE PO Take 2.5 mg by mouth daily.     Marland Kitchen atorvastatin (LIPITOR) 40 MG tablet Take 40 mg by mouth daily.    Marland Kitchen CALCIUM PO Take 1 tablet by mouth daily.    . Cholecalciferol (VITAMIN D PO) Take 1,000 Units by mouth daily.     . clindamycin (CLEOCIN T) 1 % lotion Apply 1 application topically daily.     Marland Kitchen lisinopril (PRINIVIL,ZESTRIL) 40 MG tablet Take 40 mg by mouth daily.     . metoprolol tartrate (LOPRESSOR) 25 MG tablet Take 1 tablet (25 mg total) by mouth 2 (two) times daily. 60 tablet 2  . Multiple Vitamins-Minerals (CENTRUM SILVER ADULT 50+) TABS Take 1 tablet by mouth daily.    . Omega-3 Fatty Acids (FISH OIL) 1000 MG CAPS Take 1 capsule by mouth daily.      No current facility-administered medications for this visit.    Allergies:  Review of patient's allergies indicates no known allergies.    Social History:  The patient  reports that she has never smoked. She does not have any smokeless tobacco history on file. She reports that she does not drink alcohol.   Family History:  The patient's family history includes CVA in her mother.    ROS:  Please see the history of present illness.   Otherwise, review of systems are positive for chronic ankle swelling.   All other systems are reviewed and negative.    PHYSICAL EXAM: VS:  BP 100/60 mmHg  Pulse 83  Ht  (1.6 m)  Wt 138 lb 6.4 oz (62.778 kg)  BMI 24.52 kg/m2  SpO2 91% , BMI Body mass index is 24.52 kg/(m^2). GEN: Well nourished, well developed, in no acute distress HEENT: normal Neck: no JVD, carotid bruits, or masses Cardiac: RRR; no murmurs, rubs, or gallops,no edema  Respiratory:  clear to auscultation bilaterally, normal work of breathing GI: soft,  nontender, nondistended, + BS MS: no deformity or atrophy Skin: warm and dry, no rash Neuro:  Slow gait Psych: euthymic mood, full affect   EKG:   The ekg ordered today demonstrates NSR, no ST segment changes   Recent Labs: No results found for requested labs within last 365 days.   Lipid Panel No results found for: CHOL, TRIG, HDL, CHOLHDL, VLDL, LDLCALC, LDLDIRECT   Other studies Reviewed: Additional studies/ records that were reviewed today with results demonstrating: Normal LV function in 2008..   ASSESSMENT AND PLAN:  1. SVT/PAT: Sx controlled on beta blocker.  Continue current meds.  2. HTN:  Controlled.  Watch for lightheadedness or other signs of low BP.  3. LE edema: Elevate legs at night to help. 4. Hyperlipidemia: Continue atorvastatin for lipid lowering therapy.  She has not seen her PMD in sone time.  Will check some labs as well given that she is on several chronci meds   Current medicines are reviewed at length with the patient today.  The patient concerns regarding her medicines were addressed.  The following changes have been made:  No change  Labs/ tests ordered today include:  No orders of the defined types were placed in this encounter.    Recommend 150 minutes/week of aerobic exercise Low fat, low carb, high fiber diet recommended  Disposition:   FU in 1 year   Delorise Jackson., MD  08/15/2015 1:45 PM    [facility] [address] [place], [place], [place]  [address] Phone: [phone]; Fax: [phone]

## 2015-10-11 ENCOUNTER — Other Ambulatory Visit: Payer: Self-pay | Admitting: Interventional Cardiology

## 2015-11-21 IMAGING — CR DG LUMBAR SPINE COMPLETE 4+V
6 series · 6 of 6 positions shown · non-contrast
Comparison: None.

CLINICAL DATA: Low back pain radiating to left leg

EXAM:
LUMBAR SPINE - COMPLETE 4+ VIEW

[t l-spine a.p. (1 of 2)]
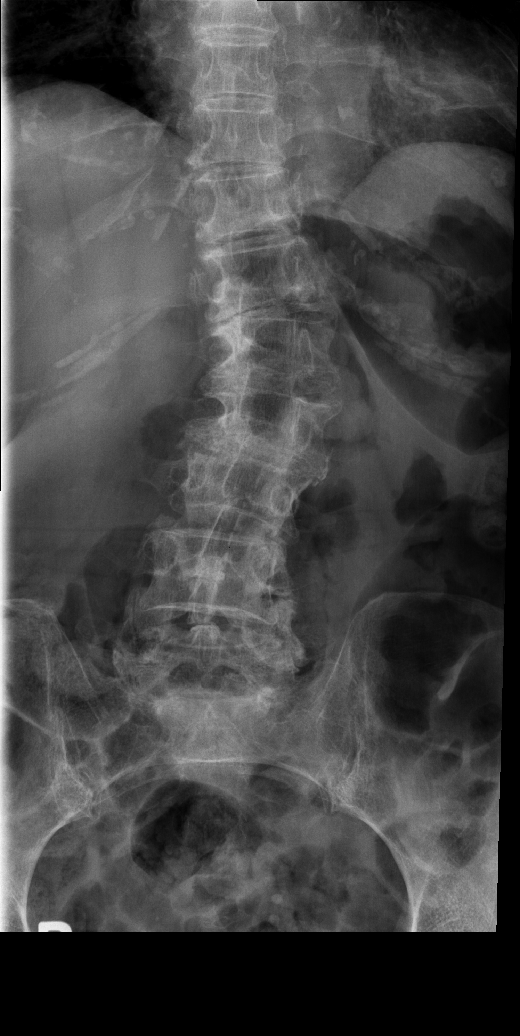

[t l-spine a.p. (2 of 2)]
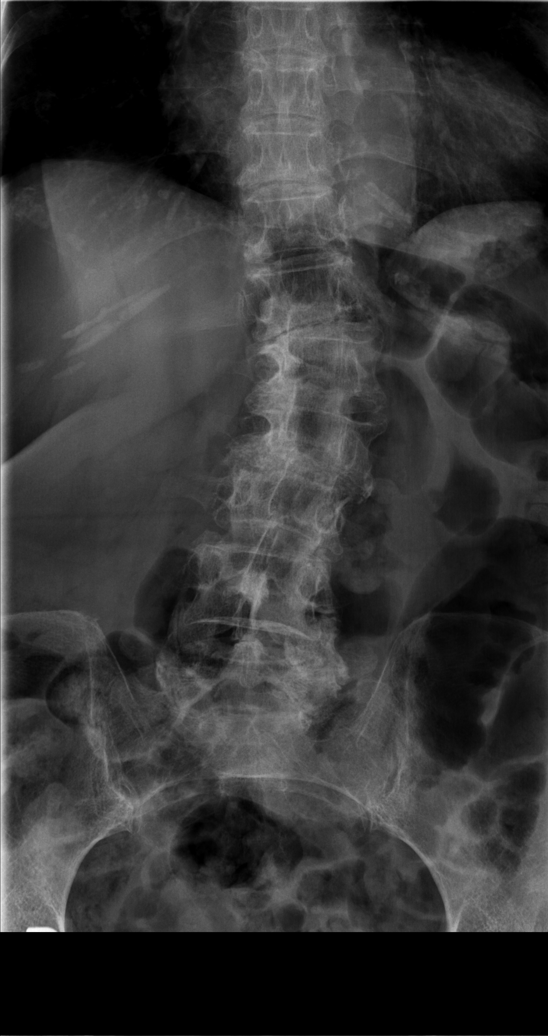

[t l-spine oblique exposure (1 of 2)]
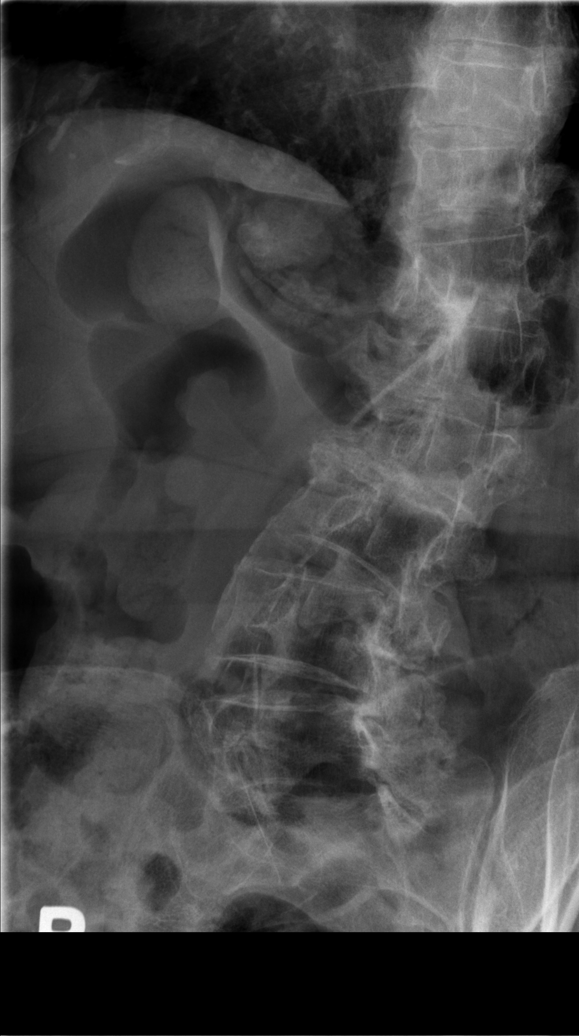

[t l-spine oblique exposure (2 of 2)]
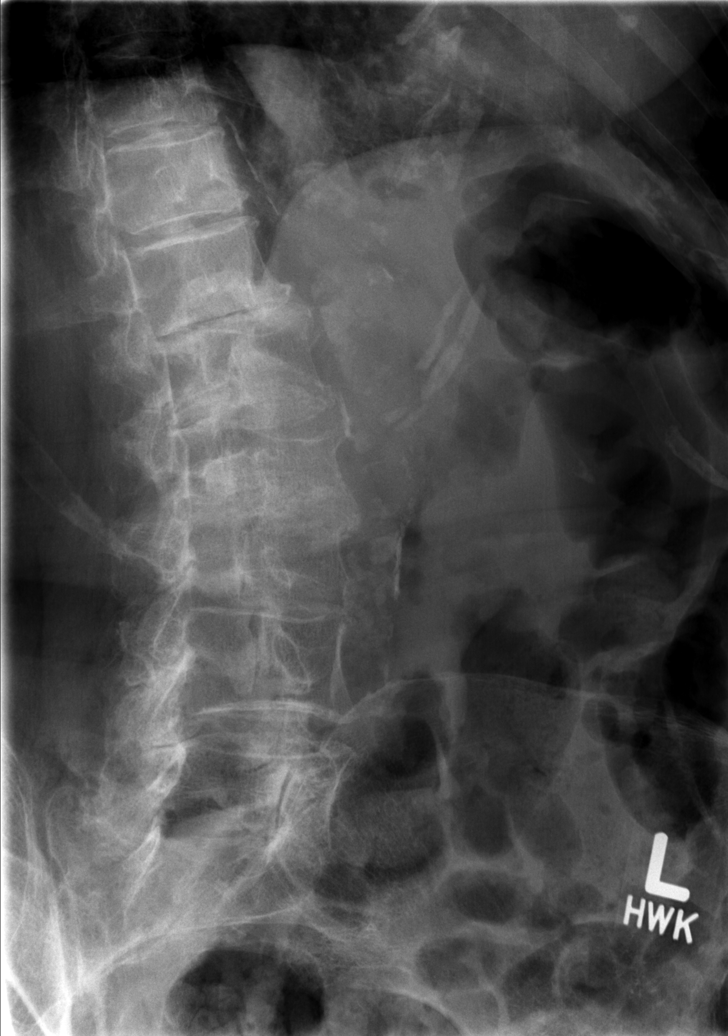

[t l-spine lat]
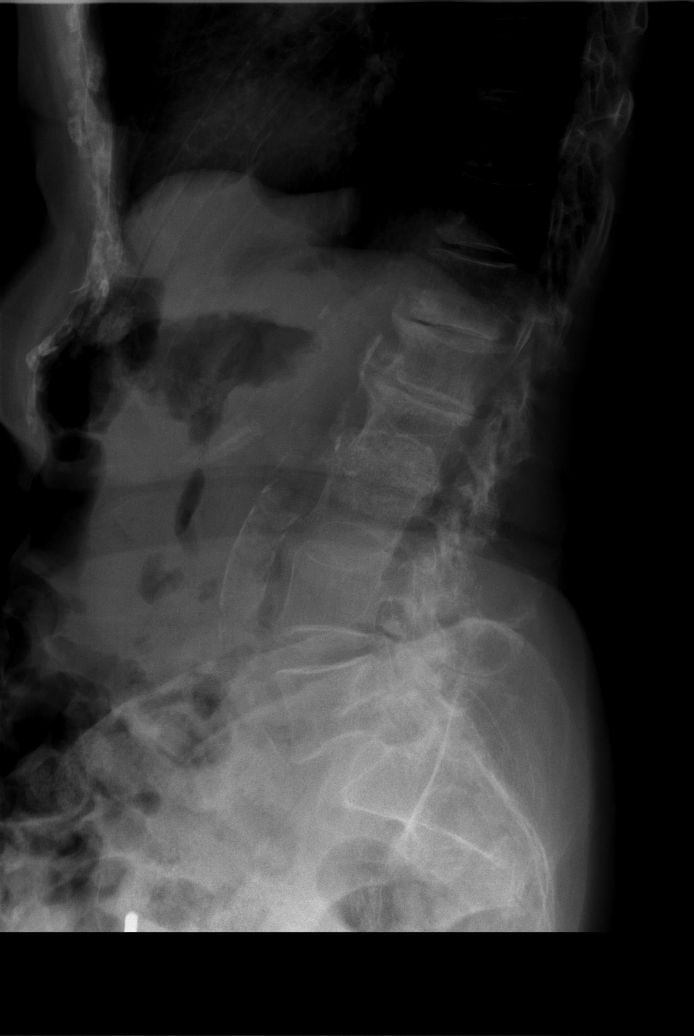

[t l-spine l5-s1 spot]
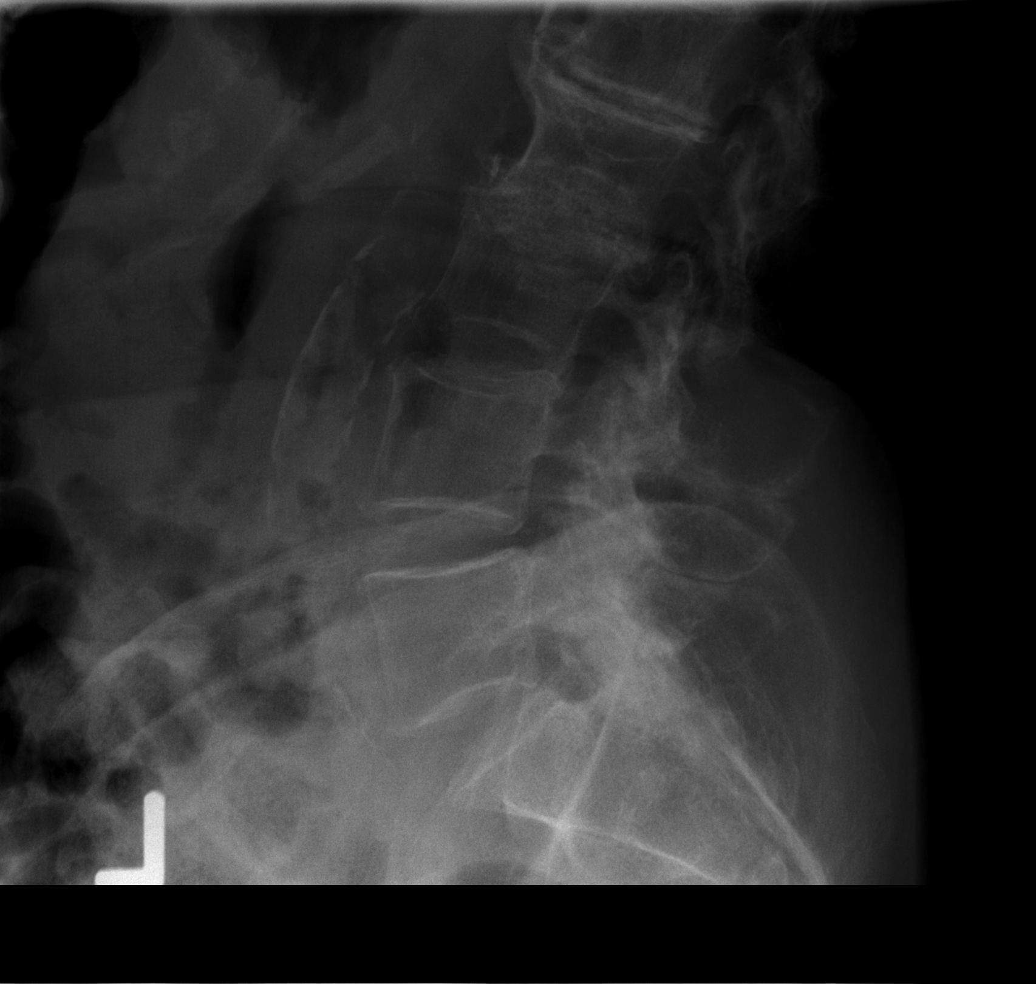

[6 of 6 positions shown; findings below may reference images not displayed]

FINDINGS: There are 5 non rib-bearing lumbar type vertebral bodies.

There is a moderate scoliotic curvature of the thoracolumbar spine
with inferior curvature convex to the left. No definite
anterolisthesis or retrolisthesis. No definite pars defects given
obliquity.

There are age-indeterminate mild (< 25%) compression deformities of
the L1 and L2 vertebral bodies. Remaining vertebral body heights
appear grossly preserved given obliquity.

There is moderate to severe multilevel DDD throughout the lumbar
spine, worse at L2-L3 and to a lesser extent, at T12-L1 and L1-L2
with disc space height loss, endplate irregularity and sclerosis.

Limited visualization of the bilateral SI joints is normal.

Nonobstructive bowel gas pattern. Atherosclerotic plaque within the
abdominal aorta. Regional soft tissues appear normal.
IMPRESSION: 1. Age-indeterminate mild (< 25%) compression deformity of the L1
and L2 vertebral bodies. Correlation for point tenderness at these
locations is recommended.
2. Moderate scoliotic curvature of the thoracolumbar spine.
3. Moderate to severe DDD throughout the lumbar spine, likely worse
at L2-L3.

## 2015-11-29 DIAGNOSIS — N183 Chronic kidney disease, stage 3 (moderate): Secondary | ICD-10-CM | POA: Diagnosis not present

## 2015-11-29 DIAGNOSIS — I10 Essential (primary) hypertension: Secondary | ICD-10-CM | POA: Diagnosis not present

## 2016-01-18 DIAGNOSIS — H1859 Other hereditary corneal dystrophies: Secondary | ICD-10-CM | POA: Diagnosis not present

## 2016-01-18 DIAGNOSIS — H1789 Other corneal scars and opacities: Secondary | ICD-10-CM | POA: Diagnosis not present

## 2016-01-18 DIAGNOSIS — H52203 Unspecified astigmatism, bilateral: Secondary | ICD-10-CM | POA: Diagnosis not present

## 2016-03-26 DIAGNOSIS — Z23 Encounter for immunization: Secondary | ICD-10-CM | POA: Diagnosis not present

## 2016-05-06 DIAGNOSIS — I471 Supraventricular tachycardia: Secondary | ICD-10-CM | POA: Diagnosis not present

## 2016-05-06 DIAGNOSIS — I10 Essential (primary) hypertension: Secondary | ICD-10-CM | POA: Diagnosis not present

## 2016-05-06 DIAGNOSIS — Z79899 Other long term (current) drug therapy: Secondary | ICD-10-CM | POA: Diagnosis not present

## 2016-05-29 DIAGNOSIS — J84112 Idiopathic pulmonary fibrosis: Secondary | ICD-10-CM | POA: Diagnosis not present

## 2016-05-29 DIAGNOSIS — I471 Supraventricular tachycardia: Secondary | ICD-10-CM | POA: Diagnosis not present

## 2016-05-29 DIAGNOSIS — M810 Age-related osteoporosis without current pathological fracture: Secondary | ICD-10-CM | POA: Diagnosis not present

## 2016-05-29 DIAGNOSIS — Z23 Encounter for immunization: Secondary | ICD-10-CM | POA: Diagnosis not present

## 2016-05-29 DIAGNOSIS — N183 Chronic kidney disease, stage 3 (moderate): Secondary | ICD-10-CM | POA: Diagnosis not present

## 2016-05-29 DIAGNOSIS — I10 Essential (primary) hypertension: Secondary | ICD-10-CM | POA: Diagnosis not present

## 2016-05-29 DIAGNOSIS — E782 Mixed hyperlipidemia: Secondary | ICD-10-CM | POA: Diagnosis not present

## 2016-05-29 DIAGNOSIS — Z0000 Encounter for general adult medical examination without abnormal findings: Secondary | ICD-10-CM | POA: Diagnosis not present

## 2016-08-23 ENCOUNTER — Encounter: Payer: Self-pay | Admitting: Interventional Cardiology

## 2016-08-29 DIAGNOSIS — H04123 Dry eye syndrome of bilateral lacrimal glands: Secondary | ICD-10-CM | POA: Diagnosis not present

## 2016-08-29 DIAGNOSIS — H1789 Other corneal scars and opacities: Secondary | ICD-10-CM | POA: Diagnosis not present

## 2016-08-29 DIAGNOSIS — H16103 Unspecified superficial keratitis, bilateral: Secondary | ICD-10-CM | POA: Diagnosis not present

## 2016-08-29 DIAGNOSIS — H52203 Unspecified astigmatism, bilateral: Secondary | ICD-10-CM | POA: Diagnosis not present

## 2016-09-01 NOTE — Progress Notes (Signed)
Patient ID: Madeline Parrish, female   DOB: [birth year], 83 y.o.   MRN: [id number]     Cardiology Office Note   Date:  09/02/2016   ID:  Madeline SchilderMargaret M Parrish, DOB [birth year], MRN [id number]  PCP:  Benjaman KindlerJim Osborne, MD    Chief Complaint  Patient presents with  . Palpitations    NO CP, SOB OR SWELLING. NO OTHER COMPLAINTS.  Marland Kitchen. Hypertension     Wt Readings from Last 3 Encounters:  09/02/16 133 lb (60.3 kg)  08/15/15 138 lb 6.4 oz (62.8 kg)  12/06/13 130 lb 12.8 oz (59.3 kg)       History of Present Illness: Madeline Parrish is a 83 y.o. female  who has a history of questionable SVT in the past. In 2014, She was having palpitations and her primary care doctor sent her up for Holter monitor. When being hooked up with a Holter monitor, it was noted that her heart rate was 170. An ECG was performed which revealed a regular tachycardia with a heart rate in the 170s. The patient could feel her fast heartbeat. She denied any chest pain. She was not really short of breath. Given that she would not likely tolerate this heart rate for a prolonged period of time, we elected to have her sent to the emergency room. She got to the EMS stretcher and then [facility], felt markedly better. Repeat ECG showed that her SVT had broken and she was now in a sinus tachycardia. We gave her 5 mg of bystolic and had her walk around the office. Her heart rate stayed in the normal range. She had no chest pain or shortness of breath. Overall she felt well at this episode described in 2014.  Since that time, she has done well on metoprolol.  No further episodes in the last year. Chronic ankle swelling.    She does not do much walking.  She is "housebound."  She no longer drives.  She does not get out and walk.  Her family takes her to the grocery store.   She needs refills on her metoprolol.      Past Medical History:  Diagnosis Date  . Acne rosacea   . CKD (chronic kidney  disease), stage III   . Depression   . Hypertension   . Osteopenia   . PUD (peptic ulcer disease)   . Restrictive lung disease   . Shingles   . Tinnitus    and vertigo    No past surgical history on file.   Current Outpatient Prescriptions  Medication Sig Dispense Refill  . AMLODIPINE BESYLATE PO Take 2.5 mg by mouth daily.     Marland Kitchen. atorvastatin (LIPITOR) 40 MG tablet Take 40 mg by mouth daily.    Marland Kitchen. CALCIUM PO Take 1 tablet by mouth daily.    . Cholecalciferol (VITAMIN D PO) Take 1,000 Units by mouth daily.     . clindamycin (CLEOCIN T) 1 % lotion Apply 1 application topically daily.     Marland Kitchen. lisinopril (PRINIVIL,ZESTRIL) 40 MG tablet Take 40 mg by mouth daily.     . metoprolol tartrate (LOPRESSOR) 25 MG tablet Take 1 tablet (25 mg total) by mouth 2 (two) times daily. 60 tablet 9  . Multiple Vitamins-Minerals (CENTRUM SILVER ADULT 50+) TABS Take 1 tablet by mouth daily.    . Omega-3 Fatty Acids (FISH OIL) 1000 MG CAPS Take 1 capsule by mouth daily.      No current  facility-administered medications for this visit.     Allergies:   Patient has no known allergies.    Social History:  The patient  reports that she has never smoked. She has never used smokeless tobacco. She reports that she does not drink alcohol or use drugs.   Family History:  The patient's family history includes CVA in her mother.    ROS:  Please see the history of present illness.   Otherwise, review of systems are positive for chronic ankle swelling.   All other systems are reviewed and negative.    PHYSICAL EXAM: VS:  BP 118/88 (BP Location: Left Arm, Patient Position: Sitting, Cuff Size: Normal)   Pulse 91   Ht 5\' 3"  (1.6 m)   Wt 133 lb (60.3 kg)   SpO2 91%   BMI 23.56 kg/m  , BMI Body mass index is 23.56 kg/m. GEN: Well nourished, well developed, in no acute distress  HEENT: normal  Neck: no JVD, carotid bruits, or masses Cardiac: RRR; no murmurs, rubs, or gallops,no edema  Respiratory:  clear to  auscultation bilaterally, normal work of breathing GI: soft, nontender, nondistended, + BS MS: no deformity or atrophy  Skin: warm and dry, no rash Neuro:  Slow gait Psych: euthymic mood, full affect   EKG:   The ekg ordered today demonstrates NSR, no ST segment changes   Recent Labs: No results found for requested labs within last 8760 hours.   Lipid Panel    Component Value Date/Time   CHOL 167 08/15/2015 1422   TRIG 76 08/15/2015 1422   HDL 65 08/15/2015 1422   CHOLHDL 2.6 08/15/2015 1422   VLDL 15 08/15/2015 1422   LDLCALC 87 08/15/2015 1422     Other studies Reviewed: Additional studies/ records that were reviewed today with results demonstrating: Normal LV function in 2008..   ASSESSMENT AND PLAN:  1. SVT/PAT: Sx controlled on beta blocker.  Continue current dose of metoprolol.  WIll refill today.  2. HTN:  Controlled.  Watch for lightheadedness or other signs of low BP.  3. LE edema: Elevate legs at night to help.  She is not bothered by the swelling and would not want a diuretic.  THis is readsonable.  4. Hyperlipidemia: Continue atorvastatin for lipid lowering therapy.     Current medicines are reviewed at length with the patient today.  The patient concerns regarding her medicines were addressed.  The following changes have been made:  No change  Labs/ tests ordered today include:  No orders of the defined types were placed in this encounter.   Recommend 150 minutes/week of aerobic exercise Low fat, low carb, high fiber diet recommended  Disposition:   FU in 1 year   Signed, Lance Muss, MD  09/02/2016 4:13 PM    [facility] [address] [place], [place], [place]  [address] Phone: [phone]; Fax: [phone]

## 2016-09-02 ENCOUNTER — Ambulatory Visit (INDEPENDENT_AMBULATORY_CARE_PROVIDER_SITE_OTHER): Payer: Medicare Other | Admitting: Interventional Cardiology

## 2016-09-02 ENCOUNTER — Encounter (INDEPENDENT_AMBULATORY_CARE_PROVIDER_SITE_OTHER): Payer: Self-pay

## 2016-09-02 ENCOUNTER — Encounter: Payer: Self-pay | Admitting: Interventional Cardiology

## 2016-09-02 VITALS — BP 118/88 | HR 91 | Ht 63.0 in | Wt 133.0 lb

## 2016-09-02 DIAGNOSIS — I10 Essential (primary) hypertension: Secondary | ICD-10-CM | POA: Diagnosis not present

## 2016-09-02 DIAGNOSIS — R600 Localized edema: Secondary | ICD-10-CM

## 2016-09-02 DIAGNOSIS — I471 Supraventricular tachycardia: Secondary | ICD-10-CM

## 2016-09-02 DIAGNOSIS — E782 Mixed hyperlipidemia: Secondary | ICD-10-CM

## 2016-09-02 MED ORDER — LISINOPRIL 40 MG PO TABS: 40.0000 mg | ORAL_TABLET | Freq: Every day | ORAL | 3 refills | Status: DC

## 2016-09-02 MED ORDER — METOPROLOL TARTRATE 25 MG PO TABS: 25.0000 mg | ORAL_TABLET | Freq: Two times a day (BID) | ORAL | 3 refills | Status: DC

## 2016-09-02 MED ORDER — AMLODIPINE BESYLATE 2.5 MG PO TABS: 2.5000 mg | ORAL_TABLET | Freq: Every day | ORAL | 3 refills | Status: DC

## 2016-09-02 NOTE — Patient Instructions (Signed)

## 2017-03-31 DIAGNOSIS — Z23 Encounter for immunization: Secondary | ICD-10-CM | POA: Diagnosis not present

## 2017-06-04 DIAGNOSIS — E782 Mixed hyperlipidemia: Secondary | ICD-10-CM | POA: Diagnosis not present

## 2017-06-04 DIAGNOSIS — M810 Age-related osteoporosis without current pathological fracture: Secondary | ICD-10-CM | POA: Diagnosis not present

## 2017-06-04 DIAGNOSIS — I10 Essential (primary) hypertension: Secondary | ICD-10-CM | POA: Diagnosis not present

## 2017-06-04 DIAGNOSIS — I471 Supraventricular tachycardia: Secondary | ICD-10-CM | POA: Diagnosis not present

## 2017-06-04 DIAGNOSIS — J84112 Idiopathic pulmonary fibrosis: Secondary | ICD-10-CM | POA: Diagnosis not present

## 2017-06-04 DIAGNOSIS — Z0000 Encounter for general adult medical examination without abnormal findings: Secondary | ICD-10-CM | POA: Diagnosis not present

## 2017-06-04 DIAGNOSIS — Z23 Encounter for immunization: Secondary | ICD-10-CM | POA: Diagnosis not present

## 2017-06-04 DIAGNOSIS — N183 Chronic kidney disease, stage 3 (moderate): Secondary | ICD-10-CM | POA: Diagnosis not present

## 2017-08-18 DIAGNOSIS — H04123 Dry eye syndrome of bilateral lacrimal glands: Secondary | ICD-10-CM | POA: Diagnosis not present

## 2017-08-18 DIAGNOSIS — H1789 Other corneal scars and opacities: Secondary | ICD-10-CM | POA: Diagnosis not present

## 2017-08-18 DIAGNOSIS — H40053 Ocular hypertension, bilateral: Secondary | ICD-10-CM | POA: Diagnosis not present

## 2017-08-18 DIAGNOSIS — H524 Presbyopia: Secondary | ICD-10-CM | POA: Diagnosis not present

## 2017-09-29 ENCOUNTER — Encounter (HOSPITAL_COMMUNITY): Payer: Self-pay | Admitting: Emergency Medicine

## 2017-09-29 ENCOUNTER — Other Ambulatory Visit: Payer: Self-pay

## 2017-09-29 ENCOUNTER — Emergency Department (HOSPITAL_COMMUNITY): Payer: Medicare Other

## 2017-09-29 ENCOUNTER — Inpatient Hospital Stay (HOSPITAL_COMMUNITY)
Admission: EM | Admit: 2017-09-29 | Discharge: 2017-10-03 | DRG: 683 | Disposition: A | Discharge status: Skilled Nursing Facility | Payer: Medicare Other | Attending: Internal Medicine | Admitting: Internal Medicine

## 2017-09-29 DIAGNOSIS — J984 Other disorders of lung: Secondary | ICD-10-CM | POA: Diagnosis present

## 2017-09-29 DIAGNOSIS — R7989 Other specified abnormal findings of blood chemistry: Secondary | ICD-10-CM | POA: Diagnosis not present

## 2017-09-29 DIAGNOSIS — E860 Dehydration: Secondary | ICD-10-CM | POA: Diagnosis not present

## 2017-09-29 DIAGNOSIS — M6282 Rhabdomyolysis: Secondary | ICD-10-CM | POA: Diagnosis present

## 2017-09-29 DIAGNOSIS — E440 Moderate protein-calorie malnutrition: Secondary | ICD-10-CM | POA: Diagnosis not present

## 2017-09-29 DIAGNOSIS — Z79899 Other long term (current) drug therapy: Secondary | ICD-10-CM

## 2017-09-29 DIAGNOSIS — N178 Other acute kidney failure: Secondary | ICD-10-CM

## 2017-09-29 DIAGNOSIS — R4182 Altered mental status, unspecified: Secondary | ICD-10-CM

## 2017-09-29 DIAGNOSIS — W19XXXA Unspecified fall, initial encounter: Secondary | ICD-10-CM | POA: Diagnosis present

## 2017-09-29 DIAGNOSIS — R748 Abnormal levels of other serum enzymes: Secondary | ICD-10-CM | POA: Diagnosis not present

## 2017-09-29 DIAGNOSIS — R6889 Other general symptoms and signs: Secondary | ICD-10-CM | POA: Diagnosis not present

## 2017-09-29 DIAGNOSIS — N183 Chronic kidney disease, stage 3 unspecified: Secondary | ICD-10-CM | POA: Diagnosis present

## 2017-09-29 DIAGNOSIS — N179 Acute kidney failure, unspecified: Secondary | ICD-10-CM | POA: Diagnosis not present

## 2017-09-29 DIAGNOSIS — Z6820 Body mass index (BMI) 20.0-20.9, adult: Secondary | ICD-10-CM

## 2017-09-29 DIAGNOSIS — I129 Hypertensive chronic kidney disease with stage 1 through stage 4 chronic kidney disease, or unspecified chronic kidney disease: Secondary | ICD-10-CM | POA: Diagnosis present

## 2017-09-29 DIAGNOSIS — T796XXA Traumatic ischemia of muscle, initial encounter: Secondary | ICD-10-CM | POA: Diagnosis not present

## 2017-09-29 DIAGNOSIS — I10 Essential (primary) hypertension: Secondary | ICD-10-CM | POA: Diagnosis present

## 2017-09-29 DIAGNOSIS — S0003XA Contusion of scalp, initial encounter: Secondary | ICD-10-CM | POA: Diagnosis present

## 2017-09-29 DIAGNOSIS — F329 Major depressive disorder, single episode, unspecified: Secondary | ICD-10-CM | POA: Diagnosis present

## 2017-09-29 DIAGNOSIS — W1830XA Fall on same level, unspecified, initial encounter: Secondary | ICD-10-CM | POA: Diagnosis present

## 2017-09-29 DIAGNOSIS — J9811 Atelectasis: Secondary | ICD-10-CM | POA: Diagnosis not present

## 2017-09-29 DIAGNOSIS — S199XXA Unspecified injury of neck, initial encounter: Secondary | ICD-10-CM | POA: Diagnosis not present

## 2017-09-29 DIAGNOSIS — H9319 Tinnitus, unspecified ear: Secondary | ICD-10-CM | POA: Diagnosis present

## 2017-09-29 DIAGNOSIS — F0390 Unspecified dementia without behavioral disturbance: Secondary | ICD-10-CM | POA: Diagnosis present

## 2017-09-29 DIAGNOSIS — M8580 Other specified disorders of bone density and structure, unspecified site: Secondary | ICD-10-CM | POA: Diagnosis present

## 2017-09-29 DIAGNOSIS — R778 Other specified abnormalities of plasma proteins: Secondary | ICD-10-CM | POA: Diagnosis present

## 2017-09-29 DIAGNOSIS — Z66 Do not resuscitate: Secondary | ICD-10-CM | POA: Diagnosis present

## 2017-09-29 DIAGNOSIS — R404 Transient alteration of awareness: Secondary | ICD-10-CM | POA: Diagnosis not present

## 2017-09-29 LAB — COMPREHENSIVE METABOLIC PANEL
ALBUMIN: 3.2 g/dL — AB (ref 3.5–5.0)
ALT: 30 U/L (ref 14–54)
AST: 91 U/L — AB (ref 15–41)
Alkaline Phosphatase: 57 U/L (ref 38–126)
Anion gap: 13 (ref 5–15)
BUN: 30 mg/dL — AB (ref 6–20)
CHLORIDE: 110 mmol/L (ref 101–111)
CO2: 22 mmol/L (ref 22–32)
CREATININE: 2.12 mg/dL — AB (ref 0.44–1.00)
Calcium: 9.1 mg/dL (ref 8.9–10.3)
GFR calc Af Amer: 22 mL/min — ABNORMAL LOW (ref >60)
GFR calc non Af Amer: 19 mL/min — ABNORMAL LOW (ref >60)
Glucose, Bld: 128 mg/dL — ABNORMAL HIGH (ref 65–99)
Potassium: 3.8 mmol/L (ref 3.5–5.1)
SODIUM: 145 mmol/L (ref 135–145)
Total Bilirubin: 1.1 mg/dL (ref 0.3–1.2)
Total Protein: 6 g/dL — ABNORMAL LOW (ref 6.5–8.1)

## 2017-09-29 LAB — URINALYSIS, ROUTINE W REFLEX MICROSCOPIC
BACTERIA UA: NONE SEEN
BILIRUBIN URINE: NEGATIVE
Glucose, UA: NEGATIVE mg/dL
Ketones, ur: 5 mg/dL — AB
Leukocytes, UA: NEGATIVE
NITRITE: NEGATIVE
Protein, ur: 30 mg/dL — AB
SPECIFIC GRAVITY, URINE: 1.015 (ref 1.005–1.030)
pH: 6 (ref 5.0–8.0)

## 2017-09-29 LAB — CBC WITH DIFFERENTIAL/PLATELET
Basophils Absolute: 0 10*3/uL (ref 0.0–0.1)
Basophils Relative: 0 %
Eosinophils Absolute: 0 10*3/uL (ref 0.0–0.7)
Eosinophils Relative: 0 %
HEMATOCRIT: 42.6 % (ref 36.0–46.0)
HEMOGLOBIN: 13.8 g/dL (ref 12.0–15.0)
LYMPHS ABS: 1.4 10*3/uL (ref 0.7–4.0)
LYMPHS PCT: 10 %
MCH: 30.8 pg (ref 26.0–34.0)
MCHC: 32.4 g/dL (ref 30.0–36.0)
MCV: 95.1 fL (ref 78.0–100.0)
MONO ABS: 0.7 10*3/uL (ref 0.1–1.0)
MONOS PCT: 5 %
NEUTROS ABS: 11.4 10*3/uL — AB (ref 1.7–7.7)
Neutrophils Relative %: 85 %
Platelets: 252 10*3/uL (ref 150–400)
RBC: 4.48 MIL/uL (ref 3.87–5.11)
RDW: 14.5 % (ref 11.5–15.5)
WBC: 13.5 10*3/uL — ABNORMAL HIGH (ref 4.0–10.5)

## 2017-09-29 LAB — BASIC METABOLIC PANEL
Anion gap: 9 (ref 5–15)
BUN: 36 mg/dL — AB (ref 6–20)
CALCIUM: 8.2 mg/dL — AB (ref 8.9–10.3)
CO2: 23 mmol/L (ref 22–32)
CREATININE: 1.99 mg/dL — AB (ref 0.44–1.00)
Chloride: 112 mmol/L — ABNORMAL HIGH (ref 101–111)
GFR calc Af Amer: 24 mL/min — ABNORMAL LOW (ref >60)
GFR, EST NON AFRICAN AMERICAN: 20 mL/min — AB (ref >60)
Glucose, Bld: 123 mg/dL — ABNORMAL HIGH (ref 65–99)
POTASSIUM: 3.5 mmol/L (ref 3.5–5.1)
SODIUM: 144 mmol/L (ref 135–145)

## 2017-09-29 LAB — HEPATIC FUNCTION PANEL
ALT: 32 U/L (ref 14–54)
AST: 113 U/L — ABNORMAL HIGH (ref 15–41)
Albumin: 2.7 g/dL — ABNORMAL LOW (ref 3.5–5.0)
Alkaline Phosphatase: 51 U/L (ref 38–126)
BILIRUBIN INDIRECT: 0.8 mg/dL (ref 0.3–0.9)
Bilirubin, Direct: 0.2 mg/dL (ref 0.1–0.5)
Total Bilirubin: 1 mg/dL (ref 0.3–1.2)
Total Protein: 5.3 g/dL — ABNORMAL LOW (ref 6.5–8.1)

## 2017-09-29 LAB — CK
CK TOTAL: 3451 U/L — AB (ref 38–234)
Total CK: 3473 U/L — ABNORMAL HIGH (ref 38–234)

## 2017-09-29 LAB — TROPONIN I: TROPONIN I: 0.92 ng/mL — AB (ref <0.03)

## 2017-09-29 LAB — I-STAT TROPONIN, ED: TROPONIN I, POC: 1.15 ng/mL — AB (ref 0.00–0.08)

## 2017-09-29 MED ORDER — ONDANSETRON HCL 4 MG PO TABS: 4.0000 mg | ORAL_TABLET | Freq: Four times a day (QID) | ORAL | Status: DC | PRN

## 2017-09-29 MED ORDER — ONDANSETRON HCL 4 MG/2ML IJ SOLN: 4.0000 mg | Freq: Four times a day (QID) | INTRAMUSCULAR | Status: DC | PRN

## 2017-09-29 MED ORDER — SODIUM CHLORIDE 0.9 % IV BOLUS: 1000.0000 mL | Freq: Once | INTRAVENOUS | Status: DC

## 2017-09-29 MED ORDER — SODIUM CHLORIDE 0.9 % IV SOLN
Freq: Once | INTRAVENOUS | Status: AC
  Administered 2017-09-29: 15:00:00 via INTRAVENOUS

## 2017-09-29 MED ORDER — METOPROLOL SUCCINATE ER 25 MG PO TB24
25.0000 mg | ORAL_TABLET | Freq: Every day | ORAL | Status: DC
  Administered 2017-09-29 – 2017-10-03 (×5): 25 mg via ORAL
  Filled 2017-09-29 (×5): qty 1

## 2017-09-29 MED ORDER — ENOXAPARIN SODIUM 30 MG/0.3ML ~~LOC~~ SOLN
30.0000 mg | SUBCUTANEOUS | Status: DC
  Administered 2017-09-29 – 2017-10-02 (×4): 30 mg via SUBCUTANEOUS
  Filled 2017-09-29 (×4): qty 0.3

## 2017-09-29 MED ORDER — DOCUSATE SODIUM 100 MG PO CAPS
100.0000 mg | ORAL_CAPSULE | Freq: Two times a day (BID) | ORAL | Status: DC
  Administered 2017-09-29 – 2017-10-03 (×6): 100 mg via ORAL
  Filled 2017-09-29 (×6): qty 1

## 2017-09-29 MED ORDER — ACETAMINOPHEN 650 MG RE SUPP: 650.0000 mg | Freq: Four times a day (QID) | RECTAL | Status: DC | PRN

## 2017-09-29 MED ORDER — SODIUM CHLORIDE 0.9% FLUSH
3.0000 mL | Freq: Two times a day (BID) | INTRAVENOUS | Status: DC
  Administered 2017-09-30 – 2017-10-02 (×3): 3 mL via INTRAVENOUS

## 2017-09-29 MED ORDER — LACTATED RINGERS IV SOLN
INTRAVENOUS | Status: DC
  Administered 2017-09-29 – 2017-10-01 (×4): via INTRAVENOUS

## 2017-09-29 MED ORDER — ACETAMINOPHEN 325 MG PO TABS
650.0000 mg | ORAL_TABLET | Freq: Four times a day (QID) | ORAL | Status: DC | PRN
  Administered 2017-10-02: 650 mg via ORAL
  Filled 2017-09-29: qty 2

## 2017-09-29 NOTE — ED Notes (Addendum)
IV placed. Lab drawn and sent. Troponin 1.15 was recieved and immediatly reported to the MD. See new orders.

## 2017-09-29 NOTE — H&P (Signed)
History and Physical    Madeline Parrish ZOX:[id number] DOB: 18-Oct-1921 DOA: 09/29/2017  PCP: Iva Boop, MD Consultants:  None Patient coming from: Home - lives alone; NOK: brother, [phone]  Chief Complaint:  Fall  HPI: Madeline Parrish is a 84 y.o. female with medical history significant of tinnitus; HTN; stage 3 CKD; restrictive lung disease; and depression presenting after a fall.   "I just fell".  Her brother is not sure when she fell since she lives alone - sometime overnight or this AM.  She doesn't remember why she fell.  She is not hurting.  She seemed to be fine yesterday - the patient and her brother went to church and ate lunch at [employer].  She was much slower at walking than usual but otherwise appeared to be normal.  She uses a cane during the day but she tries not to use one in the house.   ED Course:   Limited history - unwitnessed fall overnight.  She denies complaints.  Declining health x 6 months.  Elevated CK and troponin.  No EKG changes, chest pain.  Creatinine doubled from baseline.  Head and neck CT negative.  Urine pending.  Review of Systems: As per HPI; otherwise review of systems reviewed and negative.   Ambulatory Status:   Ambulates with a cane  Past Medical History:  Diagnosis Date  . Acne rosacea   . CKD (chronic kidney disease), stage III (HCC)   . Depression   . Hypertension   . Osteopenia   . PUD (peptic ulcer disease)   . Restrictive lung disease   . Shingles   . Tinnitus    and vertigo    History reviewed. No pertinent surgical history.  Social History   Socioeconomic History  . Marital status: Divorced    Spouse name: Not on file  . Number of children: Not on file  . Years of education: Not on file  . Highest education level: Not on file  Occupational History  . Occupation: retired  Engineer, production  . Financial resource strain: Not on file  . Food insecurity:    Worry: Not on file    Inability: Not on file  .  Transportation needs:    Medical: Not on file    Non-medical: Not on file  Tobacco Use  . Smoking status: Never Smoker  . Smokeless tobacco: Never Used  Substance and Sexual Activity  . Alcohol use: No  . Drug use: No  . Sexual activity: Not on file  Lifestyle  . Physical activity:    Days per week: Not on file    Minutes per session: Not on file  . Stress: Not on file  Relationships  . Social connections:    Talks on phone: Not on file    Gets together: Not on file    Attends religious service: Not on file    Active member of club or organization: Not on file    Attends meetings of clubs or organizations: Not on file    Relationship status: Not on file  . Intimate partner violence:    Fear of current or ex partner: Not on file    Emotionally abused: Not on file    Physically abused: Not on file    Forced sexual activity: Not on file  Other Topics Concern  . Not on file  Social History Narrative  . Not on file    No Known Allergies  Family History  Problem Relation Age of  Onset  . CVA Mother     Prior to Admission medications   Medication Sig Start Date End Date Taking? Authorizing Provider  amLODipine (NORVASC) 2.5 MG tablet Take 1 tablet (2.5 mg total) by mouth daily. 09/02/16   Corky Crafts, MD  atorvastatin (LIPITOR) 40 MG tablet Take 40 mg by mouth daily.    [provider]  CALCIUM PO Take 1 tablet by mouth daily.    [provider]  Cholecalciferol (VITAMIN D PO) Take 1,000 Units by mouth daily.     [provider]  clindamycin (CLEOCIN T) 1 % lotion Apply 1 application topically daily.  11/30/13   [provider]  lisinopril (PRINIVIL,ZESTRIL) 40 MG tablet Take 1 tablet (40 mg total) by mouth daily. 09/02/16   Corky Crafts, MD  metoprolol tartrate (LOPRESSOR) 25 MG tablet Take 1 tablet (25 mg total) by mouth 2 (two) times daily. 09/02/16   Corky Crafts, MD  Multiple Vitamins-Minerals (CENTRUM SILVER ADULT  50+) TABS Take 1 tablet by mouth daily.    [provider]  Omega-3 Fatty Acids (FISH OIL) 1000 MG CAPS Take 1 capsule by mouth daily.     [provider]    Physical Exam: Vitals:   09/29/17 1615 09/29/17 1645 09/29/17 1700 09/29/17 1715  BP: 133/68 128/72 130/63 129/66  Pulse: 74 74 75 73  Resp: (!) 22 19 (!) 23 20  Temp:      TempSrc:      SpO2: 94% 95% 94% 95%     General:  Appears calm and comfortable and is NAD Eyes:  PERRL, EOMI, normal lids, iris ENT:  grossly normal hearing, lips & tongue, mmm; appropriate dentition for age Neck:  no LAD, masses or thyromegaly Cardiovascular:  RRR, no m/r/g. No LE edema.  Respiratory:   CTA bilaterally with no wheezes/rales/rhonchi.  Normal respiratory effort. Abdomen:  soft, NT, ND, NABS Back:   normal alignment, no CVAT Skin:  no rash or induration seen on limited exam Musculoskeletal:  grossly normal tone BUE/BLE, good ROM, no bony abnormality Lower extremity:  No LE edema.  Limited foot exam with no ulcerations.  2+ distal pulses. Psychiatric:  grossly normal mood and affect, speech fluent and appropriate, AOx2 Neurologic:  CN 2-12 grossly intact, moves all extremities in coordinated fashion, sensation intact    Radiological Exams on Admission: Dg Chest 2 View  Result Date: 09/29/2017 CLINICAL DATA:  Altered mental status EXAM: CHEST - 2 VIEW COMPARISON:  None FINDINGS: Mild cardiac enlargement without heart failure. Mild bibasilar airspace disease, most likely atelectasis. Negative for effusion or mass. IMPRESSION: Mild bibasilar atelectasis. Electronically Signed   By: Marlan Palau M.D.   On: 09/29/2017 14:33   Ct Head Wo Contrast  Result Date: 09/29/2017 CLINICAL DATA:  84 year old female with recent altered mental status, progressive for the past 2 days. EXAM: CT HEAD WITHOUT CONTRAST CT CERVICAL SPINE WITHOUT CONTRAST TECHNIQUE: Multidetector CT imaging of the head and cervical spine was performed following  the standard protocol without intravenous contrast. Multiplanar CT image reconstructions of the cervical spine were also generated. COMPARISON:  None. FINDINGS: CT HEAD FINDINGS Brain: No ventriculomegaly. No midline shift, mass effect, or evidence of intracranial mass lesion. Small chronic appearing lacunar infarcts in both cerebellar hemispheres, the right thalamus, left basal ganglia. Additional bilateral cerebral white matter hypodensity. No cortical encephalomalacia. No cortically based acute infarct identified. No intracranial hemorrhage identified. Vascular: Calcified atherosclerosis at the skull base. No suspicious intracranial vascular hyperdensity. Skull:  Osteopenia.  Visualized skull base is intact. Sinuses/Orbits: Small low-density fluid levels in the left sphenoid and right maxillary sinuses. Otherwise clear. Other: Broad-based scalp hematoma traversing the vertex measuring 5 millimeters in thickness. Superimposed more focal 5-6 millimeter left posterior convexity scalp hematoma on series 5, image 66. Underlying calvarium intact. CT CERVICAL SPINE FINDINGS Alignment: Relatively preserved cervical lordosis. Subtle anterolisthesis of C7 on T1. Bilateral posterior element alignment is within normal limits. Skull base and vertebrae: No atlanto-occipital dissociation. No cervical spine fracture identified. Soft tissues and spinal canal: No prevertebral fluid or swelling. No visible canal hematoma. Negative noncontrast neck soft tissues aside from mild calcified carotid atherosclerosis. Disc levels: Widespread cervical disc space loss and endplate spurring. Intermittent facet hypertrophy. Only borderline to mild lower cervical spine degenerative spinal stenosis suspected such as at C5-C6. Upper chest: Visible upper thoracic levels appear intact. Negative lung apices aside from possible emphysema. Tortuous proximal great vessels. IMPRESSION: 1. Scalp hematoma with no underlying skull fracture identified. 2.  Chronic small vessel disease. No acute intracranial abnormality identified. 3. No acute fracture or listhesis identified in the cervical spine. 4. 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 Electronically Signed   By: Odessa Fleming M.D.   On: 09/29/2017 13:00   Ct Cervical Spine Wo Contrast  Result Date: 09/29/2017 CLINICAL DATA:  84 year old female with recent altered mental status, progressive for the past 2 days. EXAM: CT HEAD WITHOUT CONTRAST CT CERVICAL SPINE WITHOUT CONTRAST TECHNIQUE: Multidetector CT imaging of the head and cervical spine was performed following the standard protocol without intravenous contrast. Multiplanar CT image reconstructions of the cervical spine were also generated. COMPARISON:  None. FINDINGS: CT HEAD FINDINGS Brain: No ventriculomegaly. No midline shift, mass effect, or evidence of intracranial mass lesion. Small chronic appearing lacunar infarcts in both cerebellar hemispheres, the right thalamus, left basal ganglia. Additional bilateral cerebral white matter hypodensity. No cortical encephalomalacia. No cortically based acute infarct identified. No intracranial hemorrhage identified. Vascular: Calcified atherosclerosis at the skull base. No suspicious intracranial vascular hyperdensity. Skull: Osteopenia.  Visualized skull base is intact. Sinuses/Orbits: Small low-density fluid levels in the left sphenoid and right maxillary sinuses. Otherwise clear. Other: Broad-based scalp hematoma traversing the vertex measuring 5 millimeters in thickness. Superimposed more focal 5-6 millimeter left posterior convexity scalp hematoma on series 5, image 66. Underlying calvarium intact. CT CERVICAL SPINE FINDINGS Alignment: Relatively preserved cervical lordosis. Subtle anterolisthesis of C7 on T1. Bilateral posterior element alignment is within normal limits. Skull base and vertebrae: No atlanto-occipital dissociation. No cervical spine fracture identified. Soft  tissues and spinal canal: No prevertebral fluid or swelling. No visible canal hematoma. Negative noncontrast neck soft tissues aside from mild calcified carotid atherosclerosis. Disc levels: Widespread cervical disc space loss and endplate spurring. Intermittent facet hypertrophy. Only borderline to mild lower cervical spine degenerative spinal stenosis suspected such as at C5-C6. Upper chest: Visible upper thoracic levels appear intact. Negative lung apices aside from possible emphysema. Tortuous proximal great vessels. IMPRESSION: 1. Scalp hematoma with no underlying skull fracture identified. 2. Chronic small vessel disease. No acute intracranial abnormality identified. 3. No acute fracture or listhesis identified in the cervical spine. 4. 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 Electronically Signed   By: Odessa Fleming M.D.   On: 09/29/2017 13:00    EKG: Independently reviewed.  NSR with rate 82; nonspecific ST changes with no evidence of acute ischemia   Labs on Admission: I have personally reviewed the available labs and  imaging studies at the time of the admission.  Pertinent labs:   Glucose 128 BUN 30/Creatinine 2.12/GFR 19; 21/1.07 in 2/17 Albumin 3.2 CK 3473 Troponin 1.15 WBC 13.5  Assessment/Plan Principal Problem:   Fall Active Problems:   Essential hypertension, benign   Rhabdomyolysis   Acute renal failure (ARF) (HCC)   Elevated troponin   Fall -Patient with no obvious injuries from fall -Uncertain etiology from fall -Will monitor overnight on telemetry  Acute renal failure due to rhabdomyolysis -Baseline creatinine is 1.07.   -Today's creatinine is 2.12   -Likely due to prerenal failure secondary to dehydration in the setting of rhabdomyolysis -Follow up renal function by BMP -Avoid ACEI and NSAIDs -IVF at 100 cc/hour - judicious use given her age -Hepatic function panel, CK, and BMP q12h x 2 -Lovenox for DVT prophylaxis -Push PO  fluids if able -Telemetry monitoring -Strict I/Os -Vital signs q4h  Elevated troponin -Not thought to be cardiac given unremarkable EKG and lack of chest pain -More likely associated with rhabdo -Will trend -If ongoing escalation separate from CK and/or chest pain occurs, will consult cardiology  HTN -Continue Toprol XL monotherapy  DVT prophylaxis:  Lovenox  Code Status: DNR - confirmed with patient/family Family Communication: Brother present throughout evaluation Disposition Plan:  To be determined - patient may require placement or need to go live with family.  PT consult requested. Consults called: PT Admission status: It is my clinical opinion that referral for OBSERVATION is reasonable and necessary in this patient based on the above information provided. The aforementioned taken together are felt to place the patient at high risk for further clinical deterioration. However it is anticipated that the patient may be medically stable for discharge from the hospital within 24 to 48 hours.     Jonah BlueJennifer Mikaeel Petrow MD [facility]  If note is complete, please contact covering daytime or nighttime physician. [email or web address] Password [email or web address]  09/29/2017, 5:45 PM

## 2017-09-29 NOTE — ED Notes (Signed)
Pt transported back from CT.  

## 2017-09-29 NOTE — ED Notes (Addendum)
Pt informed she needs to provide a UA when possible. Pt stated she would like to be put on the Purewick.  Purewick applied at 12:15.

## 2017-09-29 NOTE — ED Notes (Signed)
Pt still on Purewick but reports she does not have to urinate at this time. Will In and Out cath
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
## 2017-09-29 NOTE — ED Notes (Signed)
Pt brother leaving for the evening. Given phone number in the event he needs to be reached.  Richard McNeely: [phone]

## 2017-09-29 NOTE — ED Provider Notes (Signed)
[facility] EMERGENCY DEPARTMENT Provider Note   CSN: [id number] Arrival date & time: 09/29/17  1056     History   Chief Complaint Chief Complaint  Patient presents with  . Fall    HPI Madeline Parrish is a 84 y.o. female.  Madeline Parrish is a 84 y.o. Female with a history of hypertension, peptic ulcer disease, CKD, and depression who presents to the ED via EMS for evaluation after an unwitnessed fall.  Patient able to provide very limited history due to altered mental status, unsure why she is here and reports she does not wish to be in the hospital, patient denies any pain or complaints.  Pt does not recall falling or what happened.  Patient's brother is present as well able to provide limited history, reports patient's health has been declining in general for the past 6 months, patient currently lives alone, but over the past 2 days he is noticed worsening mobility, reports patient walks with a cane at baseline, was having more difficulty getting around yesterday afternoon at church, he took the patient home, and at some point during the evening and night she fell, her brother went to check on her this morning and she was found on the ground partially under the bed and the house was in somewhat disarray, patient was alert and talking, but unable to get up, no obvious injuries noted when EMS arrived on the scene aside from small abrasion to the bottom of the right foot.  Patient and brother unsure if patient has been taking her medications regularly recently EMS did note some prolonged P waves, PACs and dropped beats on 3-lead during transit.  Vitals stable during transport.  Patient denies any chest pain, shortness of breath, abdominal pain, headache, dizziness, pain in her extremities or hips.  Level 5 caveat: Altered mental status.     Past Medical History:  Diagnosis Date  . Acne rosacea   . CKD (chronic kidney disease), stage III (HCC)   . Depression   .  Hypertension   . Osteopenia   . PUD (peptic ulcer disease)   . Restrictive lung disease   . Shingles   . Tinnitus    and vertigo    Patient Active Problem List   Diagnosis Date Noted  . Bilateral lower extremity edema 08/15/2015  . Hyperlipidemia 08/15/2015  . Paroxysmal supraventricular tachycardia (HCC) 12/06/2013  . Essential hypertension, benign 12/06/2013  . Syncope and collapse 12/06/2013  . Edema 12/06/2013    History reviewed. No pertinent surgical history.   OB History   None      Home Medications    Prior to Admission medications   Medication Sig Start Date End Date Taking? Authorizing Provider  amLODipine (NORVASC) 2.5 MG tablet Take 1 tablet (2.5 mg total) by mouth daily. 09/02/16   Corky Crafts, MD  atorvastatin (LIPITOR) 40 MG tablet Take 40 mg by mouth daily.    [provider]  CALCIUM PO Take 1 tablet by mouth daily.    [provider]  Cholecalciferol (VITAMIN D PO) Take 1,000 Units by mouth daily.     [provider]  clindamycin (CLEOCIN T) 1 % lotion Apply 1 application topically daily.  11/30/13   [provider]  lisinopril (PRINIVIL,ZESTRIL) 40 MG tablet Take 1 tablet (40 mg total) by mouth daily. 09/02/16   Corky Crafts, MD  metoprolol tartrate (LOPRESSOR) 25 MG tablet Take 1 tablet (25 mg total) by mouth 2 (two) times  daily. 09/02/16   Corky Crafts, MD  Multiple Vitamins-Minerals (CENTRUM SILVER ADULT 50+) TABS Take 1 tablet by mouth daily.    [provider]  Omega-3 Fatty Acids (FISH OIL) 1000 MG CAPS Take 1 capsule by mouth daily.     [provider]    Family History Family History  Problem Relation Age of Onset  . CVA Mother     Social History Social History   Tobacco Use  . Smoking status: Never Smoker  . Smokeless tobacco: Never Used  Substance Use Topics  . Alcohol use: No  . Drug use: No     Allergies   Patient has no known allergies.   Review of  Systems Review of Systems  Unable to perform ROS: Mental status change     Physical Exam Updated Vital Signs BP 115/87   Pulse 92   Temp 98.2 F (36.8 C) (Oral)   SpO2 92%   Physical Exam  Constitutional: She appears well-developed and well-nourished. No distress.  HENT:  Head: Normocephalic and atraumatic.  Mucous membranes slightly dry, oropharynx clear  Eyes: Pupils are equal, round, and reactive to light. EOM are normal. Right eye exhibits no discharge. Left eye exhibits no discharge.  Neck: Neck supple.  C-spine nontender to palpation  Cardiovascular: Normal rate, normal heart sounds and intact distal pulses.  Pulmonary/Chest: Effort normal and breath sounds normal. No stridor. No respiratory distress. She has no wheezes. She has no rales.  Respirations equal and unlabored, patient able to speak in full sentences, lungs clear to auscultation bilaterally  Abdominal: Soft. Bowel sounds are normal. She exhibits no distension and no mass. There is no tenderness. There is no guarding.  Musculoskeletal: She exhibits no edema.  Small dime sized abrasion to the ball of the left foot, no active bleeding, no palpable deformity or tenderness to the foot.  All joints supple and easily movable, no tenderness at the hips, all compartments soft  Neurological: She is alert. Coordination normal.  Patient oriented to self and place, but not oriented to time thinks it is 1950. Speech is clear, able to follow commands CN III-XII intact Normal strength in upper and lower extremities bilaterally including dorsiflexion and plantar flexion, strong and equal grip strength Sensation normal to light and sharp touch Moves extremities without ataxia, coordination intact  Skin: Skin is warm and dry. Capillary refill takes less than 2 seconds. She is not diaphoretic.  Psychiatric: She has a normal mood and affect. Her behavior is normal.  Nursing note and vitals reviewed.    ED Treatments / Results    Labs (all labs ordered are listed, but only abnormal results are displayed) Labs Reviewed  CBC WITH DIFFERENTIAL/PLATELET - Abnormal; Notable for the following components:      Result Value   WBC 13.5 (*)    Neutro Abs 11.4 (*)    All other components within normal limits  COMPREHENSIVE METABOLIC PANEL - Abnormal; Notable for the following components:   Glucose, Bld 128 (*)    BUN 30 (*)    Creatinine, Ser 2.12 (*)    Total Protein 6.0 (*)    Albumin 3.2 (*)    AST 91 (*)    GFR calc non Af Amer 19 (*)    GFR calc Af Amer 22 (*)    All other components within normal limits  CK - Abnormal; Notable for the following components:   Total CK 3,473 (*)    All other components within normal limits  I-STAT TROPONIN, ED - Abnormal; Notable for the following components:   Troponin i, poc 1.15 (*)    All other components within normal limits  URINALYSIS, ROUTINE W REFLEX MICROSCOPIC    EKG EKG Interpretation  Date/Time:  Monday September 29 2017 11:03:03 EDT Ventricular Rate:  82 PR Interval:    QRS Duration: 81 QT Interval:  387 QTC Calculation: 452 R Axis:   67 Text Interpretation:  Sinus rhythm Atrial premature complex Prolonged PR interval Abnormal ekg Confirmed by Gerhard MunchLockwood, Robert [phone]([id number]) on 09/29/2017 11:23:56 AM   Radiology Dg Chest 2 View  Result Date: 09/29/2017 CLINICAL DATA:  Altered mental status EXAM: CHEST - 2 VIEW COMPARISON:  None FINDINGS: Mild cardiac enlargement without heart failure. Mild bibasilar airspace disease, most likely atelectasis. Negative for effusion or mass. IMPRESSION: Mild bibasilar atelectasis. Electronically Signed   By: Marlan Palauharles  Clark M.D.   On: 09/29/2017 14:33   Ct Head Wo Contrast  Result Date: 09/29/2017 CLINICAL DATA:  84 year old female with recent altered mental status, progressive for the past 2 days. EXAM: CT HEAD WITHOUT CONTRAST CT CERVICAL SPINE WITHOUT CONTRAST TECHNIQUE: Multidetector CT imaging of the head and cervical spine was  performed following the standard protocol without intravenous contrast. Multiplanar CT image reconstructions of the cervical spine were also generated. COMPARISON:  None. FINDINGS: CT HEAD FINDINGS Brain: No ventriculomegaly. No midline shift, mass effect, or evidence of intracranial mass lesion. Small chronic appearing lacunar infarcts in both cerebellar hemispheres, the right thalamus, left basal ganglia. Additional bilateral cerebral white matter hypodensity. No cortical encephalomalacia. No cortically based acute infarct identified. No intracranial hemorrhage identified. Vascular: Calcified atherosclerosis at the skull base. No suspicious intracranial vascular hyperdensity. Skull: Osteopenia.  Visualized skull base is intact. Sinuses/Orbits: Small low-density fluid levels in the left sphenoid and right maxillary sinuses. Otherwise clear. Other: Broad-based scalp hematoma traversing the vertex measuring 5 millimeters in thickness. Superimposed more focal 5-6 millimeter left posterior convexity scalp hematoma on series 5, image 66. Underlying calvarium intact. CT CERVICAL SPINE FINDINGS Alignment: Relatively preserved cervical lordosis. Subtle anterolisthesis of C7 on T1. Bilateral posterior element alignment is within normal limits. Skull base and vertebrae: No atlanto-occipital dissociation. No cervical spine fracture identified. Soft tissues and spinal canal: No prevertebral fluid or swelling. No visible canal hematoma. Negative noncontrast neck soft tissues aside from mild calcified carotid atherosclerosis. Disc levels: Widespread cervical disc space loss and endplate spurring. Intermittent facet hypertrophy. Only borderline to mild lower cervical spine degenerative spinal stenosis suspected such as at C5-C6. Upper chest: Visible upper thoracic levels appear intact. Negative lung apices aside from possible emphysema. Tortuous proximal great vessels. IMPRESSION: 1. Scalp hematoma with no underlying skull  fracture identified. 2. Chronic small vessel disease. No acute intracranial abnormality identified. 3. No acute fracture or listhesis identified in the cervical spine. 4. 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 Electronically Signed   By: Odessa FlemingH  Hall M.D.   On: 09/29/2017 13:00   Ct Cervical Spine Wo Contrast  Result Date: 09/29/2017 CLINICAL DATA:  84 year old female with recent altered mental status, progressive for the past 2 days. EXAM: CT HEAD WITHOUT CONTRAST CT CERVICAL SPINE WITHOUT CONTRAST TECHNIQUE: Multidetector CT imaging of the head and cervical spine was performed following the standard protocol without intravenous contrast. Multiplanar CT image reconstructions of the cervical spine were also generated. COMPARISON:  None. FINDINGS: CT HEAD FINDINGS Brain: No ventriculomegaly. No midline shift, mass effect, or evidence of intracranial mass lesion. Small chronic appearing lacunar infarcts in  both cerebellar hemispheres, the right thalamus, left basal ganglia. Additional bilateral cerebral white matter hypodensity. No cortical encephalomalacia. No cortically based acute infarct identified. No intracranial hemorrhage identified. Vascular: Calcified atherosclerosis at the skull base. No suspicious intracranial vascular hyperdensity. Skull: Osteopenia.  Visualized skull base is intact. Sinuses/Orbits: Small low-density fluid levels in the left sphenoid and right maxillary sinuses. Otherwise clear. Other: Broad-based scalp hematoma traversing the vertex measuring 5 millimeters in thickness. Superimposed more focal 5-6 millimeter left posterior convexity scalp hematoma on series 5, image 66. Underlying calvarium intact. CT CERVICAL SPINE FINDINGS Alignment: Relatively preserved cervical lordosis. Subtle anterolisthesis of C7 on T1. Bilateral posterior element alignment is within normal limits. Skull base and vertebrae: No atlanto-occipital dissociation. No cervical spine  fracture identified. Soft tissues and spinal canal: No prevertebral fluid or swelling. No visible canal hematoma. Negative noncontrast neck soft tissues aside from mild calcified carotid atherosclerosis. Disc levels: Widespread cervical disc space loss and endplate spurring. Intermittent facet hypertrophy. Only borderline to mild lower cervical spine degenerative spinal stenosis suspected such as at C5-C6. Upper chest: Visible upper thoracic levels appear intact. Negative lung apices aside from possible emphysema. Tortuous proximal great vessels. IMPRESSION: 1. Scalp hematoma with no underlying skull fracture identified. 2. Chronic small vessel disease. No acute intracranial abnormality identified. 3. No acute fracture or listhesis identified in the cervical spine. 4. 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 Electronically Signed   By: Odessa Fleming M.D.   On: 09/29/2017 13:00    Procedures Procedures (including critical care time)  Medications Ordered in ED Medications  0.9 %  sodium chloride infusion ( Intravenous New Bag/Given 09/29/17 1517)     Initial Impression / Assessment and Plan / ED Course  I have reviewed the triage vital signs and the nursing notes.  Pertinent labs & imaging results that were available during my care of the patient were reviewed by me and considered in my medical decision making (see chart for details).  Pt presents via EMS for eval after unwitnessed fall during the night, found on floor by brother this morning. Pt altered, disroiented to time thinks it is 1950, oriented to person and place. Pt denies any pain or injury, small abrasion to left foot no underlying bony tenderness, no tenderness of the hips.  On arrival vitals are normal and patient is in no acute distress.  Aside from altered mental status no focal neurologic deficits noted, lungs clear, abdomen benign, heart slightly irregular, multiple PACs noted on EMS rhythm strip. Pt denies  CP or SOB.  We will plan for broad workup given unwitnessed fall, unknown how long patient was on the floor, labs, including troponin and CK, as well as urinalysis, EKG, chest x-ray, CT of the head and C-spine.  11:52 AM Notified by nursing of positive troponin 1.17, EKG shows prolonged PR interval, but no obvious ST changes. Pt continues to deny chest pain or SOB.   Patient's lab work evaluation has returned, she has a leukocytosis of 13.5 with a left shift, hemoglobin is normal, patient's creatinine has doubled, at 2.12 today, glucose is 128, AST is mildly elevated at 91 all other LFTs unremarkable, CK is very elevated at 3473, I feel patient's elevated troponin is likely in line with the CK and more so related to the patient being on the floor for an unknown amount of time rather than due to an acute coronary syndrome, especially in the absence of any chest pain or ischemic EKG changes.  Urinalysis  pending.  Will start patient on normal saline infusion.  CT of the head and C-spine shows small scalp hematoma with no underlying fracture, no acute intracranial abnormality, chronic small vessel ischemic disease, no acute fracture or listhesis of the cervical spine, there is mild cervical degenerative changes.  Chest x-ray shows mild bibasilar atelectasis no other acute findings.  Patient's altered mental status in the setting of AKI with elevated CK and troponin will require admission for continued rehydration and further evaluation.  Will consult [facility] hospitalist for medicine admission.  2:39 PM Spoke with Dr. Ophelia Charter with [facility] who will see and admit the patient.  Patient discussed with Dr. Jeraldine Loots who saw and evaluated the patient as well and is in agreement with plan.  Final Clinical Impressions(s) / ED Diagnoses   Final diagnoses:  Fall, initial encounter  Altered mental status, unspecified altered mental status type  Elevated troponin  AKI (acute kidney injury) (HCC)  Hematoma  of scalp, initial encounter    ED Discharge Orders    None       Legrand Rams 09/29/17 1625    Gerhard Munch, MD 09/29/17 1711

## 2017-09-29 NOTE — ED Notes (Signed)
CRITICAL TROP. RESULTS REPORTED TO MARIA, RN AND KASEY, PA

## 2017-09-29 NOTE — ED Notes (Signed)
Pt resting at this time, lights dimmed for comfort

## 2017-09-29 NOTE — ED Notes (Signed)
Pt dinner at bedside, pt is sitting up eatting

## 2017-09-29 NOTE — ED Triage Notes (Signed)
Pt arrives via EMS from home alone with reports of a fall. Pt's brother reports pt has been disoriented for 6 months but mobility has declined over the past 2 days. Pt and brother are poor historians and unable to tell hx or reason of medications. Abrasion on left heel from fall.

## 2017-09-29 NOTE — ED Notes (Signed)
Transferred to CT/Xray

## 2017-09-29 NOTE — ED Notes (Signed)
Dinner tray ordered.

## 2017-09-30 DIAGNOSIS — N179 Acute kidney failure, unspecified: Secondary | ICD-10-CM | POA: Diagnosis not present

## 2017-09-30 DIAGNOSIS — M6281 Muscle weakness (generalized): Secondary | ICD-10-CM | POA: Diagnosis not present

## 2017-09-30 DIAGNOSIS — R4182 Altered mental status, unspecified: Secondary | ICD-10-CM | POA: Diagnosis not present

## 2017-09-30 DIAGNOSIS — J984 Other disorders of lung: Secondary | ICD-10-CM | POA: Diagnosis present

## 2017-09-30 DIAGNOSIS — N183 Chronic kidney disease, stage 3 (moderate): Secondary | ICD-10-CM | POA: Diagnosis present

## 2017-09-30 DIAGNOSIS — I340 Nonrheumatic mitral (valve) insufficiency: Secondary | ICD-10-CM | POA: Diagnosis not present

## 2017-09-30 DIAGNOSIS — F0390 Unspecified dementia without behavioral disturbance: Secondary | ICD-10-CM | POA: Diagnosis present

## 2017-09-30 DIAGNOSIS — R278 Other lack of coordination: Secondary | ICD-10-CM | POA: Diagnosis not present

## 2017-09-30 DIAGNOSIS — W19XXXA Unspecified fall, initial encounter: Secondary | ICD-10-CM | POA: Diagnosis not present

## 2017-09-30 DIAGNOSIS — W1830XA Fall on same level, unspecified, initial encounter: Secondary | ICD-10-CM | POA: Diagnosis present

## 2017-09-30 DIAGNOSIS — Z6820 Body mass index (BMI) 20.0-20.9, adult: Secondary | ICD-10-CM | POA: Diagnosis not present

## 2017-09-30 DIAGNOSIS — M8580 Other specified disorders of bone density and structure, unspecified site: Secondary | ICD-10-CM | POA: Diagnosis present

## 2017-09-30 DIAGNOSIS — E860 Dehydration: Secondary | ICD-10-CM | POA: Diagnosis present

## 2017-09-30 DIAGNOSIS — R748 Abnormal levels of other serum enzymes: Secondary | ICD-10-CM | POA: Diagnosis not present

## 2017-09-30 DIAGNOSIS — Z79899 Other long term (current) drug therapy: Secondary | ICD-10-CM | POA: Diagnosis not present

## 2017-09-30 DIAGNOSIS — R488 Other symbolic dysfunctions: Secondary | ICD-10-CM | POA: Diagnosis not present

## 2017-09-30 DIAGNOSIS — I129 Hypertensive chronic kidney disease with stage 1 through stage 4 chronic kidney disease, or unspecified chronic kidney disease: Secondary | ICD-10-CM | POA: Diagnosis present

## 2017-09-30 DIAGNOSIS — I10 Essential (primary) hypertension: Secondary | ICD-10-CM | POA: Diagnosis not present

## 2017-09-30 DIAGNOSIS — E440 Moderate protein-calorie malnutrition: Secondary | ICD-10-CM | POA: Diagnosis not present

## 2017-09-30 DIAGNOSIS — H9319 Tinnitus, unspecified ear: Secondary | ICD-10-CM | POA: Diagnosis present

## 2017-09-30 DIAGNOSIS — N178 Other acute kidney failure: Secondary | ICD-10-CM | POA: Diagnosis not present

## 2017-09-30 DIAGNOSIS — F329 Major depressive disorder, single episode, unspecified: Secondary | ICD-10-CM | POA: Diagnosis present

## 2017-09-30 DIAGNOSIS — R7989 Other specified abnormal findings of blood chemistry: Secondary | ICD-10-CM | POA: Diagnosis present

## 2017-09-30 DIAGNOSIS — Z9181 History of falling: Secondary | ICD-10-CM | POA: Diagnosis not present

## 2017-09-30 DIAGNOSIS — T796XXA Traumatic ischemia of muscle, initial encounter: Secondary | ICD-10-CM | POA: Diagnosis not present

## 2017-09-30 DIAGNOSIS — M6282 Rhabdomyolysis: Secondary | ICD-10-CM | POA: Diagnosis present

## 2017-09-30 DIAGNOSIS — S0003XA Contusion of scalp, initial encounter: Secondary | ICD-10-CM | POA: Diagnosis present

## 2017-09-30 DIAGNOSIS — Z66 Do not resuscitate: Secondary | ICD-10-CM | POA: Diagnosis present

## 2017-09-30 LAB — HEPATIC FUNCTION PANEL
ALBUMIN: 2.5 g/dL — AB (ref 3.5–5.0)
ALT: 36 U/L (ref 14–54)
AST: 108 U/L — ABNORMAL HIGH (ref 15–41)
Alkaline Phosphatase: 43 U/L (ref 38–126)
BILIRUBIN INDIRECT: 0.9 mg/dL (ref 0.3–0.9)
Bilirubin, Direct: 0.2 mg/dL (ref 0.1–0.5)
TOTAL PROTEIN: 5 g/dL — AB (ref 6.5–8.1)
Total Bilirubin: 1.1 mg/dL (ref 0.3–1.2)

## 2017-09-30 LAB — BASIC METABOLIC PANEL
ANION GAP: 8 (ref 5–15)
BUN: 37 mg/dL — ABNORMAL HIGH (ref 6–20)
CALCIUM: 8.2 mg/dL — AB (ref 8.9–10.3)
CO2: 23 mmol/L (ref 22–32)
CREATININE: 1.46 mg/dL — AB (ref 0.44–1.00)
Chloride: 113 mmol/L — ABNORMAL HIGH (ref 101–111)
GFR, EST AFRICAN AMERICAN: 35 mL/min — AB (ref >60)
GFR, EST NON AFRICAN AMERICAN: 30 mL/min — AB (ref >60)
Glucose, Bld: 86 mg/dL (ref 65–99)
Potassium: 3.5 mmol/L (ref 3.5–5.1)
SODIUM: 144 mmol/L (ref 135–145)

## 2017-09-30 LAB — CBC
HCT: 37.8 % (ref 36.0–46.0)
HCT: 37.6 % (ref 36.0–46.0)
Hemoglobin: 12.4 g/dL (ref 12.0–15.0)
Hemoglobin: 12 g/dL (ref 12.0–15.0)
MCH: 31.1 pg (ref 26.0–34.0)
MCH: 30.5 pg (ref 26.0–34.0)
MCHC: 32.8 g/dL (ref 30.0–36.0)
MCHC: 31.9 g/dL (ref 30.0–36.0)
MCV: 94.7 fL (ref 78.0–100.0)
MCV: 95.4 fL (ref 78.0–100.0)
PLATELETS: 232 10*3/uL (ref 150–400)
PLATELETS: 232 10*3/uL (ref 150–400)
RBC: 3.99 MIL/uL (ref 3.87–5.11)
RBC: 3.94 MIL/uL (ref 3.87–5.11)
RDW: 15.1 % (ref 11.5–15.5)
RDW: 14.9 % (ref 11.5–15.5)
WBC: 10.9 10*3/uL — ABNORMAL HIGH (ref 4.0–10.5)
WBC: 10.1 10*3/uL (ref 4.0–10.5)

## 2017-09-30 LAB — CK: CK TOTAL: 3002 U/L — AB (ref 38–234)

## 2017-09-30 LAB — TROPONIN I
TROPONIN I: 0.88 ng/mL — AB (ref <0.03)
TROPONIN I: 0.73 ng/mL — AB (ref <0.03)

## 2017-09-30 MED ORDER — ENSURE ENLIVE PO LIQD
237.0000 mL | Freq: Two times a day (BID) | ORAL | Status: DC
  Administered 2017-09-30 – 2017-10-03 (×6): 237 mL via ORAL

## 2017-09-30 NOTE — Progress Notes (Signed)
09/29/17 23:30 Ordered low bed for patient on admission but was told that they don't have any at the moment. Mats placed on the floor on each side of bed.Bed alarm on. 09/30/17 07:49 Unit Secretary called again and requested for a low bed. Madeline Parrish, Madeline Parrish, RCharity Parrish

## 2017-09-30 NOTE — Care Management Obs Status (Signed)
MEDICARE OBSERVATION STATUS NOTIFICATION   Patient Details  Name: Madeline Parrish MRN: [id number] Date of Birth: 01-03-1922   Medicare Observation Status Notification Given:  Yes  Madeline FlemingsKimberly R Nikolina Simerson, RN 09/30/2017, 1:47 PM

## 2017-09-30 NOTE — Progress Notes (Signed)
Transferred patient into low bed.

## 2017-09-30 NOTE — Progress Notes (Signed)
[facility]                                                                              Patient Demographics  Madeline Parrish, is a 84 y.o. female, DOB - 01/11/1922, ZOX:[id number]  Admit date - 09/29/2017   Admitting Physician Jonah BlueJennifer Yates, MD  Outpatient Primary MD for the patient is Via, Caryn BeeKevin, MD  Outpatient specialists:   LOS - 0  days   Medical records reviewed and are as summarized below:    Chief Complaint  Patient presents with  . Fall       Brief summary   Madeline Parrish is a 84 y.o. female with tinnitus; HTN; stage 3 CKD; restrictive lung disease; and depression presenting after a fall. "I just fell". Her brother at the bedside was not sure when she fell, as the patient lives alone.  Patient reported that she tripped on the side of the bed. ED Course:   Limited history - unwitnessed fall overnight.  She denies complaints.  Declining health x 6 months.  Elevated CK and troponin.  No EKG changes, chest pain.  Creatinine doubled from baseline.  Head and neck CT negative.   Assessment & Plan    Principal Problem: Mechanical fall -PT OT evaluation, may need higher level of care, lives alone  Active Problems: Acute kidney injury -Baseline creatinine 1.0, presenting with a creatinine function of 2.  Due to acute rhabdomyolysis -Continue IV fluid hydration, creatinine improving, 1.4 today     Rhabdomyolysis -Likely due to fall, CK 3473 at the time of admission -Continue IV fluid hydration,    Elevated troponin -Likely due to rhabdomyolysis and acute kidney injury -Patient is not complaining of any chest pain or shortness of breath -EKG showed rate 82, no acute ST-T wave changes suggestive of ischemia  -Troponins trending down, follow 2D echo  Benign essential hypertension -Continue Toprol-XL  Dementia UA negative for UTI, appears to have some underlying dementia, unclear baseline CT head negative for acute stroke, showed chronic small  vessel disease, no acute intracranial abnormality PT OT evaluation  Code Status: DNR DVT Prophylaxis:  Lovenox  Family Communication: Discussed in detail with the patient, all imaging results, lab results explained to the patient    Disposition Plan: Possibly next 24 hours  Time Spent in minutes35 minutes  Procedures:  None  Consultants:   None  Antimicrobials:      Medications  Scheduled Meds: . docusate sodium  100 mg Oral BID  . enoxaparin (LOVENOX) injection  30 mg Subcutaneous Q24H  . metoprolol succinate  25 mg Oral Daily  . sodium chloride flush  3 mL Intravenous Q12H   Continuous Infusions: . lactated ringers 100 mL/hr at 09/30/17 0523   PRN Meds:.acetaminophen **OR** acetaminophen, ondansetron **OR** ondansetron (ZOFRAN) IV   Antibiotics   Anti-infectives (From admission, onward)   None        Subjective:   Madeline Parrish was seen and examined today.  Alert and awake, oriented to self, denies any chest pain or shortness of breath. Patient denies dizziness, abdominal pain, N/V/D/C, new weakness, numbess, tingling. No acute events overnight.  Objective:   Vitals:   09/29/17 2200 09/29/17 2239 09/29/17 2312 09/30/17 0520  BP: (!) 102/52 120/62  (!) 125/54  Pulse: 74 75  70  Resp: 18 17  18   Temp:  98.6 F (37 C)  98.3 F (36.8 C)  TempSrc:  Oral  Oral  SpO2: 92% 94%  98%  Weight:  58.2 kg (128 lb 4.9 oz)    Height:   5\' 4"  (1.626 m)     Intake/Output Summary (Last 24 hours) at 09/30/2017 0857 Last data filed at 09/30/2017 0600 Gross per 24 hour  Intake 1178.33 ml  Output 501 ml  Net 677.33 ml     Wt Readings from Last 3 Encounters:  09/29/17 58.2 kg (128 lb 4.9 oz)  09/02/16 60.3 kg (133 lb)  08/15/15 62.8 kg (138 lb 6.4 oz)     Exam  General: Alert and oriented x 1, NAD  Eyes:  HEENT:  Atraumatic, normocephalic, dry mucosal membranes  Cardiovascular: S1 S2 auscultated, Regular rate and rhythm.  Respiratory: Clear to  auscultation bilaterally, no wheezing, rales or rhonchi  Gastrointestinal: Soft, nontender, nondistended, + bowel sounds  Ext: no pedal edema bilaterally  Neuro: no new deficits  Musculoskeletal: No digital cyanosis, clubbing  Skin: No rashes  Psych: Normal affect and demeanor, alert and oriented x1   Data Reviewed:  I have personally reviewed following labs and imaging studies  Micro Results No results found for this or any previous visit (from the past 240 hour(s)).  Radiology Reports Dg Chest 2 View  Result Date: 09/29/2017 CLINICAL DATA:  Altered mental status EXAM: CHEST - 2 VIEW COMPARISON:  None FINDINGS: Mild cardiac enlargement without heart failure. Mild bibasilar airspace disease, most likely atelectasis. Negative for effusion or mass. IMPRESSION: Mild bibasilar atelectasis. Electronically Signed   By: Marlan Palau M.D.   On: 09/29/2017 14:33   Ct Head Wo Contrast  Result Date: 09/29/2017 CLINICAL DATA:  83 year old female with recent altered mental status, progressive for the past 2 days. EXAM: CT HEAD WITHOUT CONTRAST CT CERVICAL SPINE WITHOUT CONTRAST TECHNIQUE: Multidetector CT imaging of the head and cervical spine was performed following the standard protocol without intravenous contrast. Multiplanar CT image reconstructions of the cervical spine were also generated. COMPARISON:  None. FINDINGS: CT HEAD FINDINGS Brain: No ventriculomegaly. No midline shift, mass effect, or evidence of intracranial mass lesion. Small chronic appearing lacunar infarcts in both cerebellar hemispheres, the right thalamus, left basal ganglia. Additional bilateral cerebral white matter hypodensity. No cortical encephalomalacia. No cortically based acute infarct identified. No intracranial hemorrhage identified. Vascular: Calcified atherosclerosis at the skull base. No suspicious intracranial vascular hyperdensity. Skull: Osteopenia.  Visualized skull base is intact. Sinuses/Orbits: Small  low-density fluid levels in the left sphenoid and right maxillary sinuses. Otherwise clear. Other: Broad-based scalp hematoma traversing the vertex measuring 5 millimeters in thickness. Superimposed more focal 5-6 millimeter left posterior convexity scalp hematoma on series 5, image 66. Underlying calvarium intact. CT CERVICAL SPINE FINDINGS Alignment: Relatively preserved cervical lordosis. Subtle anterolisthesis of C7 on T1. Bilateral posterior element alignment is within normal limits. Skull base and vertebrae: No atlanto-occipital dissociation. No cervical spine fracture identified. Soft tissues and spinal canal: No prevertebral fluid or swelling. No visible canal hematoma. Negative noncontrast neck soft tissues aside from mild calcified carotid atherosclerosis. Disc levels: Widespread cervical disc space loss and endplate spurring. Intermittent facet hypertrophy. Only borderline to mild lower cervical spine degenerative spinal stenosis suspected such as at C5-C6. Upper chest: Visible upper thoracic levels  appear intact. Negative lung apices aside from possible emphysema. Tortuous proximal great vessels. IMPRESSION: 1. Scalp hematoma with no underlying skull fracture identified. 2. Chronic small vessel disease. No acute intracranial abnormality identified. 3. No acute fracture or listhesis identified in the cervical spine. 4. 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 Electronically Signed   By: Odessa Fleming M.D.   On: 09/29/2017 13:00   Ct Cervical Spine Wo Contrast  Result Date: 09/29/2017 CLINICAL DATA:  84 year old female with recent altered mental status, progressive for the past 2 days. EXAM: CT HEAD WITHOUT CONTRAST CT CERVICAL SPINE WITHOUT CONTRAST TECHNIQUE: Multidetector CT imaging of the head and cervical spine was performed following the standard protocol without intravenous contrast. Multiplanar CT image reconstructions of the cervical spine were also generated.  COMPARISON:  None. FINDINGS: CT HEAD FINDINGS Brain: No ventriculomegaly. No midline shift, mass effect, or evidence of intracranial mass lesion. Small chronic appearing lacunar infarcts in both cerebellar hemispheres, the right thalamus, left basal ganglia. Additional bilateral cerebral white matter hypodensity. No cortical encephalomalacia. No cortically based acute infarct identified. No intracranial hemorrhage identified. Vascular: Calcified atherosclerosis at the skull base. No suspicious intracranial vascular hyperdensity. Skull: Osteopenia.  Visualized skull base is intact. Sinuses/Orbits: Small low-density fluid levels in the left sphenoid and right maxillary sinuses. Otherwise clear. Other: Broad-based scalp hematoma traversing the vertex measuring 5 millimeters in thickness. Superimposed more focal 5-6 millimeter left posterior convexity scalp hematoma on series 5, image 66. Underlying calvarium intact. CT CERVICAL SPINE FINDINGS Alignment: Relatively preserved cervical lordosis. Subtle anterolisthesis of C7 on T1. Bilateral posterior element alignment is within normal limits. Skull base and vertebrae: No atlanto-occipital dissociation. No cervical spine fracture identified. Soft tissues and spinal canal: No prevertebral fluid or swelling. No visible canal hematoma. Negative noncontrast neck soft tissues aside from mild calcified carotid atherosclerosis. Disc levels: Widespread cervical disc space loss and endplate spurring. Intermittent facet hypertrophy. Only borderline to mild lower cervical spine degenerative spinal stenosis suspected such as at C5-C6. Upper chest: Visible upper thoracic levels appear intact. Negative lung apices aside from possible emphysema. Tortuous proximal great vessels. IMPRESSION: 1. Scalp hematoma with no underlying skull fracture identified. 2. Chronic small vessel disease. No acute intracranial abnormality identified. 3. No acute fracture or listhesis identified in the  cervical spine. 4. 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 Electronically Signed   By: Odessa Fleming M.D.   On: 09/29/2017 13:00    Lab Data:  CBC: Recent Labs  Lab 09/29/17 1127 09/30/17 0229 09/30/17 0728  WBC 13.5* 10.9* 10.1  NEUTROABS 11.4*  --   --   HGB 13.8 12.4 12.0  HCT 42.6 37.8 37.6  MCV 95.1 94.7 95.4  PLT 252 232 232   Basic Metabolic Panel: Recent Labs  Lab 09/29/17 1127 09/29/17 2124  NA 145 144  K 3.8 3.5  CL 110 112*  CO2 22 23  GLUCOSE 128* 123*  BUN 30* 36*  CREATININE 2.12* 1.99*  CALCIUM 9.1 8.2*   GFR: Estimated Creatinine Clearance: 15.3 mL/min (A) (by C-G formula based on SCr of 1.99 mg/dL (H)). Liver Function Tests: Recent Labs  Lab 09/29/17 1127 09/29/17 2124  AST 91* 113*  ALT 30 32  ALKPHOS 57 51  BILITOT 1.1 1.0  PROT 6.0* 5.3*  ALBUMIN 3.2* 2.7*   No results for input(s): LIPASE, AMYLASE in the last 168 hours. No results for input(s): AMMONIA in the last 168 hours. Coagulation Profile: No results for input(s):  INR, PROTIME in the last 168 hours. Cardiac Enzymes: Recent Labs  Lab 09/29/17 1127 09/29/17 1901 09/29/17 2124 09/30/17 0229  CKTOTAL 3,473*  --  3,451*  --   TROPONINI  --  0.92*  --  0.88*   BNP (last 3 results) No results for input(s): PROBNP in the last 8760 hours. HbA1C: No results for input(s): HGBA1C in the last 72 hours. CBG: No results for input(s): GLUCAP in the last 168 hours. Lipid Profile: No results for input(s): CHOL, HDL, LDLCALC, TRIG, CHOLHDL, LDLDIRECT in the last 72 hours. Thyroid Function Tests: No results for input(s): TSH, T4TOTAL, FREET4, T3FREE, THYROIDAB in the last 72 hours. Anemia Panel: No results for input(s): VITAMINB12, FOLATE, FERRITIN, TIBC, IRON, RETICCTPCT in the last 72 hours. Urine analysis:    Component Value Date/Time   COLORURINE YELLOW 09/29/2017 1702   APPEARANCEUR CLEAR 09/29/2017 1702   LABSPEC 1.015 09/29/2017 1702   PHURINE 6.0  09/29/2017 1702   GLUCOSEU NEGATIVE 09/29/2017 1702   HGBUR MODERATE (A) 09/29/2017 1702   BILIRUBINUR NEGATIVE 09/29/2017 1702   KETONESUR 5 (A) 09/29/2017 1702   PROTEINUR 30 (A) 09/29/2017 1702   NITRITE NEGATIVE 09/29/2017 1702   LEUKOCYTESUR NEGATIVE 09/29/2017 1702     Ripudeep Rai M.D. [facility] 09/30/2017, 8:57 AM  Pager: [phone] Between 7am to 7pm - call Pager - [phone]  After 7pm go to [email or web address] - password [email or web address]  Call night coverage person covering after 7pm

## 2017-09-30 NOTE — Progress Notes (Signed)
Initial Nutrition Assessment  DOCUMENTATION CODES:   Non-severe (moderate) malnutrition in context of chronic illness  INTERVENTION:   Ensure Enlive po BID, each supplement provides 350 kcal and 20 grams of protein   NUTRITION DIAGNOSIS:   Moderate Malnutrition(restrictive lung disease, CKD III) related to chronic illness(restrictive lung disease) as evidenced by mild fat depletion, moderate muscle depletion.  GOAL:   Patient will meet greater than or equal to 90% of their needs  MONITOR:   PO intake, Supplement acceptance, Labs, Weight trends  REASON FOR ASSESSMENT:   Malnutrition Screening Tool    ASSESSMENT:    84 yo female admitted post fall, ARF due to rhambdomyolysis. Pt with hx of HTN, CKD III, restrictive lung disease, depression, tinnitus and vertigo. Pt lives alone, brother lives 5 miles away.    Pt reports good appetite, recorded po intake 50% at breakfast this AM. Pt reports she eats well at home and eats 3 meals per day. Pt also indicates that she has never been a big breakfast eater and might eat a muffin with coffee at breakfast. Pt reports she does not take supplements such as Ensure or Boost.   After interview, pt's brother followed this RD out of room and indicated to this RD that pt has not been eating well. Brother believes pt has been living on "peanut butter and bananas."  Pt is unsure if she has lost weight. Per weight encounters, weight is trending down but not significant for time frame. Current wt of 128 pounds, weighed 133 pounds 1 year ago, 138 pounds in 2017 and 130 pounds in 2015.  Labs: Creatinine 1.46 (improving) Meds: reviewed  NUTRITION - FOCUSED PHYSICAL EXAM:    Most Recent Value  Orbital Region  Moderate depletion  Upper Arm Region  No depletion  Buccal Region  Mild depletion  Temple Region  Moderate depletion  Clavicle Bone Region  Mild depletion  Clavicle and Acromion Bone Region  Mild depletion  Scapular Bone Region  No  depletion  Dorsal Hand  Mild depletion  Patellar Region  Mild depletion  Anterior Thigh Region  Moderate depletion  Posterior Calf Region  No depletion  Edema (RD Assessment)  None       Diet Order:  Diet Heart Room service appropriate? Yes; Fluid consistency: Thin  EDUCATION NEEDS:   No education needs have been identified at this time  Skin:  Skin Assessment: Reviewed RN Assessment  Last BM:  3/2  Height:   Ht Readings from Last 1 Encounters:  09/29/17 5\' 4"  (1.626 m)    Weight:   Wt Readings from Last 1 Encounters:  09/29/17 128 lb 4.9 oz (58.2 kg)    Ideal Body Weight:     BMI:  Body mass index is 22.02 kg/m.  Estimated Nutritional Needs:   Kcal:  1350-1500 kcals  Protein:  65-75 g  Fluid:  >/= 1.4 L   Romelle Starcherate Alanis Clift MS, RD, LDN, CNSC [phone] Pager  [phone] Weekend/On-Call Pager

## 2017-09-30 NOTE — Progress Notes (Signed)
Patient had no urine output since 2200. Bladder scan performed and got 200 cc.Patient denies any urge to urinate. Will continue to monitor. Makailyn Mccormick, Drinda Buttsharito Joselita, RCharity fundraiser

## 2017-09-30 NOTE — Evaluation (Signed)
Physical Therapy Evaluation Patient Details Name: Madeline Parrish MRN: [id number] DOB: 1922-06-03 Today's Date: 09/30/2017   History of Present Illness  84 y.o. female admitted for fall. Pt was found to have rhabdomyolysis and ARF. PMH: CKD, depression, HTN, osteopenia, restrictive lung disease, shingles, peptic ulcer disease, and tinnitus.   Clinical Impression  Pt presents with overall decrease in functional mobility secondary to above including impairments listed below (see PT Problem List). Bed mobility, transfers, and ambulation with RW with noted posterior bias and instability requiring min assist to prevent LOB. Pt demonstrates impaired cognition, STM, awareness, and problem solving. Prior to admission, pt lived alone and used a [facility] for household ambulation. Pt to benefit from SNF level PT to maximize safety, functional mobility, and independence. Will continue to follow acutely and progress as tolerated.     Follow Up Recommendations SNF;Supervision/Assistance - 24 hour    Equipment Recommendations  Other (comment)(defer to next venue of care)    Recommendations for Other Services       Precautions / Restrictions Precautions Precautions: Fall Restrictions Weight Bearing Restrictions: No      Mobility  Bed Mobility Overal bed mobility: Needs Assistance Bed Mobility: Supine to Sit;Sit to Supine     Supine to sit: Min guard Sit to supine: Min assist   General bed mobility comments: min guard for safety and min assist for positioning during sit to supine  Transfers Overall transfer level: Needs assistance Equipment used: Rolling walker (2 wheeled) Transfers: Sit to/from Stand Sit to Stand: Min assist         General transfer comment: min assist for balance due to noted posterior bias. pt requires momentum and blocking LEs on bed to rise with assist to steady RW and VCs for hand placement. sit<>stand x3 on commode for pericare.  Ambulation/Gait Ambulation/Gait  assistance: Min assist Ambulation Distance (Feet): 50 Feet Assistive device: Rolling walker (2 wheeled) Gait Pattern/deviations: Shuffle;Drifts right/left;Trunk flexed;Decreased stride length;Step-to pattern Gait velocity: decreased   General Gait Details: close min guard for balance and safety. pt unsteady especially with turning requiring min assist to prevent LOB and stabilize walker  Stairs            Wheelchair Mobility    Modified Rankin (Stroke Patients Only)       Balance Overall balance assessment: Needs assistance Sitting-balance support: Feet supported Sitting balance-Leahy Scale: Fair   Postural control: Posterior lean Standing balance support: Bilateral upper extremity supported;During functional activity Standing balance-Leahy Scale: Poor Standing balance comment: requires bil UE support and external support to prevent fall. able to stand statically with RW min assist for pericare 3x30 sec                             Pertinent Vitals/Pain Pain Assessment: No/denies pain    Home Living Family/patient expects to be discharged to:: Private residence Living Arrangements: Alone Available Help at Discharge: Family(brother) Type of Home: House Home Access: Stairs to enter   Secretary/administrator of Steps: 4 Home Layout: One level Home Equipment: Cane - single point Additional Comments: brother lives ~5 miles away from pt    Prior Function Level of Independence: Needs assistance   Gait / Transfers Assistance Needed: ambulates with SPC  ADL's / Homemaking Assistance Needed: brother does grocery shopping and helps with household chores  Comments: does not drive per brother (though pt says she does)     Hand Dominance   Dominant Hand: Right  Extremity/Trunk Assessment   Upper Extremity Assessment Upper Extremity Assessment: Generalized weakness    Lower Extremity Assessment Lower Extremity Assessment: Generalized weakness     Cervical / Trunk Assessment Cervical / Trunk Assessment: Kyphotic  Communication   Communication: No difficulties  Cognition Arousal/Alertness: Awake/alert Behavior During Therapy: Flat affect Overall Cognitive Status: Impaired/Different from baseline Area of Impairment: Orientation;Memory;Following commands;Safety/judgement;Awareness;Problem solving                 Orientation Level: Disoriented to;Time;Situation   Memory: Decreased recall of precautions;Decreased short-term memory Following Commands: Follows one step commands consistently;Follows one step commands with increased time Safety/Judgement: Decreased awareness of safety;Decreased awareness of deficits Awareness: Intellectual Problem Solving: Requires verbal cues;Slow processing General Comments: pt giving incorrect home information and unable to remember home questions without brother prompting, pt unable to remember what she ate for breakfast right before PT entered room, she does not know the date or why she is in the hospital. pt had BM while walking in hallway without recognizing it      General Comments General comments (skin integrity, edema, etc.): VSS. pt's brother [name] present during session. pt had 3 BMs while ambulating without any awareness of it    Exercises     Assessment/Plan    PT Assessment Patient needs continued PT services  PT Problem List Decreased strength;Decreased activity tolerance;Decreased balance;Decreased mobility;Decreased cognition;Decreased knowledge of use of DME;Decreased safety awareness;Decreased knowledge of precautions       PT Treatment Interventions DME instruction;Gait training;Stair training;Functional mobility training;Therapeutic activities;Balance training;Therapeutic exercise;Neuromuscular re-education;Cognitive remediation;Patient/family education    PT Goals (Current goals can be found in the Care Plan section)  Acute Rehab PT Goals Patient Stated Goal: to  get better PT Goal Formulation: With patient/family Time For Goal Achievement: 10/14/17 Potential to Achieve Goals: Fair    Frequency Min 3X/week   Barriers to discharge Decreased caregiver support pt lives alone    Co-evaluation               AM-PAC PT "6 Clicks" Daily Activity  Outcome Measure Difficulty turning over in bed (including adjusting bedclothes, sheets and blankets)?: A Little Difficulty moving from lying on back to sitting on the side of the bed? : Unable Difficulty sitting down on and standing up from a chair with arms (e.g., wheelchair, bedside commode, etc,.)?: Unable Help needed moving to and from a bed to chair (including a wheelchair)?: A Little Help needed walking in hospital room?: A Little Help needed climbing 3-5 steps with a railing? : A Lot 6 Click Score: 13    End of Session Equipment Utilized During Treatment: Gait belt Activity Tolerance: Patient tolerated treatment well Patient left: in bed;with call bell/phone within reach;with bed alarm set Nurse Communication: Mobility status PT Visit Diagnosis: Unsteadiness on feet (R26.81);History of falling (Z91.81);Muscle weakness (generalized) (M62.81)    Time: [id number] PT Time Calculation (min) (ACUTE ONLY): 33 min   Charges:   PT Evaluation $PT Eval Moderate Complexity: 1 Mod PT Treatments $Therapeutic Activity: 8-22 mins   PT G Codes:        Barrie DunkerEmily Fatin Bachicha, SPT   Barrie Dunkermily Kashius Dominic 09/30/2017, 10:10 AM

## 2017-09-30 NOTE — Care Management Note (Addendum)
Case Management Note  Patient Details  Name: Madeline Parrish MRN: [id number] Date of Birth: [birth year]  Subjective/Objective:   Admitted for Fall, no obvious injuries. Acute renal failure due to rhabdomyolysis          Action/Plan: Prior to admission patient lived at home alone.  Brother Richard lives 5 miles away.  Home DME: cane.  In to speak with patient/brother regarding Physical Therapy Recommendation: SNF;Supervision/Assistance - 24 hour, but brother not present at this time.  NCM spoke with brother "[name]" at [phone], regarding Physical Therapy recommendation of SNF; versus home with home health.  Per patient's brother [name] [facility] said they didn't think she could handle being at home. NCM discussed with LCSW Erie NoeVanessa and currently patient is still "Observation" so would not meet all criteria for SNF consideration. NCM will continue to follow for discharge transition needs.   Expected Discharge Date:   10/01/2017               Expected Discharge Plan:   To Be determined  In-House Referral:  Clinical Social Work  Discharge planning Services  CM Consult  Status of Service:  In process, will continue to follow  Yancey FlemingsKimberly R Becton, RN  Nurse case manager [place] 09/30/2017, 1:49 PM

## 2017-10-01 ENCOUNTER — Inpatient Hospital Stay (HOSPITAL_COMMUNITY): Payer: Medicare Other

## 2017-10-01 DIAGNOSIS — R748 Abnormal levels of other serum enzymes: Secondary | ICD-10-CM

## 2017-10-01 DIAGNOSIS — T796XXA Traumatic ischemia of muscle, initial encounter: Secondary | ICD-10-CM

## 2017-10-01 DIAGNOSIS — I340 Nonrheumatic mitral (valve) insufficiency: Secondary | ICD-10-CM

## 2017-10-01 DIAGNOSIS — W19XXXA Unspecified fall, initial encounter: Secondary | ICD-10-CM

## 2017-10-01 DIAGNOSIS — N179 Acute kidney failure, unspecified: Principal | ICD-10-CM

## 2017-10-01 DIAGNOSIS — I10 Essential (primary) hypertension: Secondary | ICD-10-CM

## 2017-10-01 DIAGNOSIS — E440 Moderate protein-calorie malnutrition: Secondary | ICD-10-CM

## 2017-10-01 LAB — ECHOCARDIOGRAM COMPLETE
Height: 64 in
Weight: 2052.92 oz

## 2017-10-01 LAB — BASIC METABOLIC PANEL
Anion gap: 13 (ref 5–15)
BUN: 20 mg/dL (ref 6–20)
CO2: 23 mmol/L (ref 22–32)
Calcium: 8.4 mg/dL — ABNORMAL LOW (ref 8.9–10.3)
Chloride: 105 mmol/L (ref 101–111)
Creatinine, Ser: 1.04 mg/dL — ABNORMAL HIGH (ref 0.44–1.00)
GFR calc Af Amer: 52 mL/min — ABNORMAL LOW (ref >60)
GFR, EST NON AFRICAN AMERICAN: 45 mL/min — AB (ref >60)
Glucose, Bld: 118 mg/dL — ABNORMAL HIGH (ref 65–99)
Potassium: 3 mmol/L — ABNORMAL LOW (ref 3.5–5.1)
SODIUM: 141 mmol/L (ref 135–145)

## 2017-10-01 LAB — CBC
HEMATOCRIT: 44 % (ref 36.0–46.0)
Hemoglobin: 14.6 g/dL (ref 12.0–15.0)
MCH: 31.1 pg (ref 26.0–34.0)
MCHC: 33.2 g/dL (ref 30.0–36.0)
MCV: 93.6 fL (ref 78.0–100.0)
PLATELETS: 250 10*3/uL (ref 150–400)
RBC: 4.7 MIL/uL (ref 3.87–5.11)
RDW: 14.4 % (ref 11.5–15.5)
WBC: 11.4 10*3/uL — AB (ref 4.0–10.5)

## 2017-10-01 MED ORDER — POTASSIUM CHLORIDE CRYS ER 20 MEQ PO TBCR
40.0000 meq | EXTENDED_RELEASE_TABLET | Freq: Once | ORAL | Status: AC
  Administered 2017-10-01: 40 meq via ORAL
  Filled 2017-10-01: qty 2

## 2017-10-01 MED ORDER — DEXTROSE-NACL 5-0.45 % IV SOLN
INTRAVENOUS | Status: DC
  Administered 2017-10-01 – 2017-10-03 (×2): via INTRAVENOUS

## 2017-10-01 NOTE — Progress Notes (Signed)
PROGRESS NOTE  Madeline SchilderMargaret M Parrish AOZ:[id number] DOB: Jan 25, 1922 DOA: 09/29/2017 PCP: Iva BoopVia, Kevin, MD  HPI/Recap of past 24 hours: Madeline Parrish is a 84 y.o. female with tinnitus; HTN; stage 3 CKD; restrictive lung disease; and depression presenting after a fall. "I just fell". Her brother at the bedside was not sure when she fell, as the patient lives alone.  Patient reported that she tripped on the side of the bed. Declining health x 6 months. In the ED, noted elevated CK and troponin. No EKG changes, chest pain. Creatinine doubled from baseline. Head and neck CT negative.  Patient admitted for further management.  Today, patient denies any new symptoms, reported feeling okay overall.  Denies any chest pain, shortness of breath, abdominal pain, nausea/vomiting, cough.  Looks comfortable   Assessment/Plan: Principal Problem:   Fall Active Problems:   Essential hypertension, benign   Rhabdomyolysis   Acute renal failure (ARF) (HCC)   Elevated troponin   Malnutrition of moderate degree  Mechanical fall CT head and neck unremarkable PT OT on board, recommend SNF  Rhabdomyolysis Likely due to fall, patient down for an unknown duration  CK 3473 at the time of admission Continue IV fluid hydration Daily CK levels  Acute kidney injury Cr Improving 1.04 Baseline creatinine 1.0, presenting with a creatinine function of 2 Likely due to acute rhabdomyolysis Continue IV fluid hydration  Elevated troponin Chest pain-free Likely due to rhabdomyolysis and acute kidney injury Troponin with flat trend EKG with no acute ST-T wave changes suggestive of ischemia  Echo showed EF of 60-65%, with grade 1 diastolic dysfunction  Benign essential hypertension Continue Toprol-XL  Dementia UA negative for UTI, appears to have some underlying dementia, unclear baseline CT head negative for acute stroke, showed chronic small vessel disease, no acute intracranial abnormality PT OT  evaluation    Code Status: DNR  Family Communication: Spoke with brother at bedside  Disposition Plan: SNF   Consultants:  None  Procedures:  None  Antimicrobials:  None  DVT prophylaxis: Lovenox   Objective: Vitals:   09/30/17 2351 10/01/17 0433 10/01/17 0914 10/01/17 1714  BP: (!) 154/72 (!) 152/61 (!) 156/62 140/79  Pulse: 80 72 74 89  Resp: 18 19 18 18   Temp: 98.5 F (36.9 C) 98.6 F (37 C) 98.3 F (36.8 C) 97.9 F (36.6 C)  TempSrc: Oral Oral Oral Oral  SpO2: 95% 95% 96% 93%  Weight:      Height:        Intake/Output Summary (Last 24 hours) at 10/01/2017 1724 Last data filed at 10/01/2017 1700 Gross per 24 hour  Intake 3628.33 ml  Output 2150 ml  Net 1478.33 ml   Filed Weights   09/29/17 2239  Weight: 58.2 kg (128 lb 4.9 oz)    Exam:   General: NAD  Cardiovascular: S1, S2 present  Respiratory: Chest clear to auscultation bilaterally  Abdomen: Soft, nontender, nondistended, bowel sounds present  Musculoskeletal: No pedal edema bilaterally  Skin: Normal  Psychiatry: Normal mood   Data Reviewed: CBC: Recent Labs  Lab 09/29/17 1127 09/30/17 0229 09/30/17 0728 10/01/17 0725  WBC 13.5* 10.9* 10.1 11.4*  NEUTROABS 11.4*  --   --   --   HGB 13.8 12.4 12.0 14.6  HCT 42.6 37.8 37.6 44.0  MCV 95.1 94.7 95.4 93.6  PLT 252 232 232 250   Basic Metabolic Panel: Recent Labs  Lab 09/29/17 1127 09/29/17 2124 09/30/17 0728 10/01/17 0725  NA 145 144 144 141  K  3.8 3.5 3.5 3.0*  CL 110 112* 113* 105  CO2 22 23 23 23   GLUCOSE 128* 123* 86 118*  BUN 30* 36* 37* 20  CREATININE 2.12* 1.99* 1.46* 1.04*  CALCIUM 9.1 8.2* 8.2* 8.4*   GFR: Estimated Creatinine Clearance: 29.2 mL/min (A) (by C-G formula based on SCr of 1.04 mg/dL (H)). Liver Function Tests: Recent Labs  Lab 09/29/17 1127 09/29/17 2124 09/30/17 0728  AST 91* 113* 108*  ALT 30 32 36  ALKPHOS 57 51 43  BILITOT 1.1 1.0 1.1  PROT 6.0* 5.3* 5.0*  ALBUMIN 3.2* 2.7*  2.5*   No results for input(s): LIPASE, AMYLASE in the last 168 hours. No results for input(s): AMMONIA in the last 168 hours. Coagulation Profile: No results for input(s): INR, PROTIME in the last 168 hours. Cardiac Enzymes: Recent Labs  Lab 09/29/17 1127 09/29/17 1901 09/29/17 2124 09/30/17 0229 09/30/17 0728  CKTOTAL 3,473*  --  3,451*  --  3,002*  TROPONINI  --  0.92*  --  0.88* 0.73*   BNP (last 3 results) No results for input(s): PROBNP in the last 8760 hours. HbA1C: No results for input(s): HGBA1C in the last 72 hours. CBG: No results for input(s): GLUCAP in the last 168 hours. Lipid Profile: No results for input(s): CHOL, HDL, LDLCALC, TRIG, CHOLHDL, LDLDIRECT in the last 72 hours. Thyroid Function Tests: No results for input(s): TSH, T4TOTAL, FREET4, T3FREE, THYROIDAB in the last 72 hours. Anemia Panel: No results for input(s): VITAMINB12, FOLATE, FERRITIN, TIBC, IRON, RETICCTPCT in the last 72 hours. Urine analysis:    Component Value Date/Time   COLORURINE YELLOW 09/29/2017 1702   APPEARANCEUR CLEAR 09/29/2017 1702   LABSPEC 1.015 09/29/2017 1702   PHURINE 6.0 09/29/2017 1702   GLUCOSEU NEGATIVE 09/29/2017 1702   HGBUR MODERATE (A) 09/29/2017 1702   BILIRUBINUR NEGATIVE 09/29/2017 1702   KETONESUR 5 (A) 09/29/2017 1702   PROTEINUR 30 (A) 09/29/2017 1702   NITRITE NEGATIVE 09/29/2017 1702   LEUKOCYTESUR NEGATIVE 09/29/2017 1702   Sepsis Labs: @LABRCNTIP (procalcitonin:4,lacticidven:4)  )No results found for this or any previous visit (from the past 240 hour(s)).    Studies: No results found.  Scheduled Meds: . docusate sodium  100 mg Oral BID  . enoxaparin (LOVENOX) injection  30 mg Subcutaneous Q24H  . feeding supplement (ENSURE ENLIVE)  237 mL Oral BID BM  . metoprolol succinate  25 mg Oral Daily  . sodium chloride flush  3 mL Intravenous Q12H    Continuous Infusions: . dextrose 5 % and 0.45% NaCl 100 mL/hr at 10/01/17 0931     LOS: 1 day      Briant Cedar, MD [facility]   If 7PM-7AM, please contact night-coverage [email or web address] Password [facility] 10/01/2017, 5:24 PM

## 2017-10-01 NOTE — Progress Notes (Signed)
  Echocardiogram 2D Echocardiogram has been performed.  Madeline SavoyCasey N Adalynd Donahoe 10/01/2017, 10:57 AM

## 2017-10-01 NOTE — Progress Notes (Addendum)
Physical Therapy Treatment Patient Details Name: Madeline Parrish MRN: [id number] DOB: 14-Sep-1921 Today's Date: 10/01/2017    History of Present Illness 84 y.o. female admitted for fall. Pt was found to have rhabdomyolysis and ARF. PMH: CKD, depression, HTN, osteopenia, restrictive lung disease, shingles, peptic ulcer disease, and tinnitus.     PT Comments    Limited by BM and hygiene/pericare this session. Pt able to roll multiple times for pericare, bridge for repositioning in bed, and perform supine>sit min assist. Transfer sit<>stand from EOB min assist with RW and noted posterior bias. Pt pleasant, alert, and following simple commands appropriately but demonstrates poor awareness and is disoriented to place, time, and situation this session. Current plan remains appropriate. Will continue to follow acutely and progress as tolerated.    Follow Up Recommendations  SNF;Supervision/Assistance - 24 hour     Equipment Recommendations  Other (comment)(defer to next venue of care)    Recommendations for Other Services       Precautions / Restrictions Precautions Precautions: Fall Restrictions Weight Bearing Restrictions: No    Mobility  Bed Mobility Overal bed mobility: Needs Assistance Bed Mobility: Rolling;Supine to Sit;Sit to Supine Rolling: Min assist   Supine to sit: Min assist Sit to supine: Min guard   General bed mobility comments: min assist for safety. pt able to roll bil multiple times for pericare with min assist and heavy use of bed rail. supine>sit min assist for balance and scooting hips to EOB. pt able to bring LEs back onto bed and lower trunk without physical assist. min assist to reposition in bed with pt able to assist with UEs on bed rail and bridging  Transfers Overall transfer level: Needs assistance Equipment used: Rolling walker (2 wheeled) Transfers: Sit to/from Stand Sit to Stand: Min assist         General transfer comment: sit<>stand 1x from  EOB  min assit for balance with significant posterior bias. rocking 3x for momentum and blocking of LEs on bed to rise. VCs for hand placement  Ambulation/Gait             General Gait Details: unable to perform this session   Stairs            Wheelchair Mobility    Modified Rankin (Stroke Patients Only)       Balance Overall balance assessment: Needs assistance Sitting-balance support: Feet supported Sitting balance-Leahy Scale: Fair   Postural control: Posterior lean Standing balance support: Bilateral upper extremity supported;During functional activity Standing balance-Leahy Scale: Poor Standing balance comment: requires bil UE support and external support to prevent fall                            Cognition Arousal/Alertness: Awake/alert Behavior During Therapy: Flat affect Overall Cognitive Status: Impaired/Different from baseline Area of Impairment: Orientation;Memory;Following commands;Safety/judgement;Awareness;Problem solving                 Orientation Level: Disoriented to;Place;Time;Situation   Memory: Decreased recall of precautions;Decreased short-term memory Following Commands: Follows one step commands consistently;Follows one step commands with increased time Safety/Judgement: Decreased awareness of safety;Decreased awareness of deficits Awareness: Intellectual Problem Solving: Slow processing;Difficulty sequencing;Requires verbal cues General Comments: pt able to state name and birthday, she is extremely disoriented unable to state year when given multiple options and saying she thinks she is at "a social gathering." pt completely unaware she had a BM and has very little reaction when told about it, she  demonstrates slow processing but is able to follow single step commands appropriately      Exercises      General Comments General comments (skin integrity, edema, etc.): HR 82-95 throughout session      Pertinent  Vitals/Pain Pain Assessment: No/denies pain    Home Living                      Prior Function             Acute Rehab PT Goals Patient Stated Goal: to get better PT Goal Formulation: With patient/family Time For Goal Achievement: 10/14/17 Potential to Achieve Goals: Fair Progress towards PT goals: Not progressing toward goals - pt limited by BM and hygiene/pericare this session    Frequency    Min 2X/week      PT Plan Frequency needs to be updated    Co-evaluation              AM-PAC PT "6 Clicks" Daily Activity  Outcome Measure  Difficulty turning over in bed (including adjusting bedclothes, sheets and blankets)?: A Little Difficulty moving from lying on back to sitting on the side of the bed? : Unable Difficulty sitting down on and standing up from a chair with arms (e.g., wheelchair, bedside commode, etc,.)?: Unable Help needed moving to and from a bed to chair (including a wheelchair)?: A Little Help needed walking in hospital room?: A Little Help needed climbing 3-5 steps with a railing? : A Lot 6 Click Score: 13    End of Session Equipment Utilized During Treatment: Gait belt Activity Tolerance: Patient tolerated treatment well Patient left: in bed;with call bell/phone within reach;with bed alarm set Nurse Communication: Mobility status PT Visit Diagnosis: Unsteadiness on feet (R26.81);History of falling (Z91.81);Muscle weakness (generalized) (M62.81)     Time: [id number] PT Time Calculation (min) (ACUTE ONLY): 24 min  Charges:  $Therapeutic Activity: 23-37 mins                    G Codes:       Barrie DunkerEmily Deandrea Rion, SPT   Barrie Dunkermily Kataleia Quaranta 10/01/2017, 4:58 PM

## 2017-10-02 LAB — BASIC METABOLIC PANEL
Anion gap: 8 (ref 5–15)
BUN: 19 mg/dL (ref 6–20)
CO2: 25 mmol/L (ref 22–32)
CREATININE: 1.14 mg/dL — AB (ref 0.44–1.00)
Calcium: 8.1 mg/dL — ABNORMAL LOW (ref 8.9–10.3)
Chloride: 108 mmol/L (ref 101–111)
GFR calc Af Amer: 47 mL/min — ABNORMAL LOW (ref >60)
GFR, EST NON AFRICAN AMERICAN: 40 mL/min — AB (ref >60)
Glucose, Bld: 96 mg/dL (ref 65–99)
Potassium: 3.5 mmol/L (ref 3.5–5.1)
SODIUM: 141 mmol/L (ref 135–145)

## 2017-10-02 LAB — CBC
HCT: 42.8 % (ref 36.0–46.0)
Hemoglobin: 13.9 g/dL (ref 12.0–15.0)
MCH: 30.6 pg (ref 26.0–34.0)
MCHC: 32.5 g/dL (ref 30.0–36.0)
MCV: 94.3 fL (ref 78.0–100.0)
PLATELETS: 227 10*3/uL (ref 150–400)
RBC: 4.54 MIL/uL (ref 3.87–5.11)
RDW: 14.7 % (ref 11.5–15.5)
WBC: 10.3 10*3/uL (ref 4.0–10.5)

## 2017-10-02 LAB — CK: Total CK: 1109 U/L — ABNORMAL HIGH (ref 38–234)

## 2017-10-02 NOTE — Consult Note (Signed)
            [facility] Primary Care Navigator  10/02/2017  Madeline Parrish Madeline Parrish [birth year] [id number]   Went to seepatientat the bedside to identify possible discharge needs.  Patient noted to be forgetful at times but she states recalling "passing out and had a fall" that resulted to this admission.  Patient confirms thatDr. Caryn BeeKevin Via [facility].  [place] difficulty. Patient verbalized that brother Madeline Parrish(Madeline Parrish) has been helping her manage her medications at home.  Patient reports that her brother ("lives not far" from her)providestransportation to her doctors' appointments.  She verbalized that she lives alone but her brother and her daughter Madeline Parrish(Madeline Parrish) are able to assist with her needs at home.  Anticipated discharge plan is skilled nursing facility (SNF- in process) per therapy recommendation for rehabilitation. Per Inpatient social worker note, Madeline Parrish (brother) expressed concern about his sister discharging home as she lives alone and cannot care for herself.  Patient expressed understanding to callprimary care provider's officewhen shereturnsback home for a post discharge follow-up appointment within a week or sooner if needed. Patient letter (with PCP's contact number) was provided- at the bedside as a reminder.  Explained to patientabout [facility] CM services available for health management at home but she indicated having nopressing needs or concerns at this point. Patient voiced understanding to seek referral from primary care provider to [facility] if deemed necessary for services in the near future- if she returns back home.  [facility] care management information provided for future needs thatshe may have.   For additional questions please contact:  Madeline Parrish, BSN, RN-BC [facility] PRIMARY CARE  Navigator Cell: [phone]

## 2017-10-02 NOTE — NC FL2 (Signed)
[place] MEDICAID FL2 LEVEL OF CARE SCREENING TOOL     IDENTIFICATION  Patient Name: Madeline Parrish Birthdate: December 05, 1921 Sex: female Admission Date (Current Location): 09/29/2017  [facility] and [place] Number:  Producer, television/film/video and Address:  [facility] [place], [address] [place], [place] [address]      Provider Number: [id number]  Attending Physician Name and Address:  Briant Cedar, MD  Relative Name and Phone Number:  Mamie Laurel (brother) [phone]    Current Level of Care: Hospital Recommended Level of Care: Skilled Nursing Facility Prior Approval Number:    Date Approved/Denied:   PASRR Number: (Submitted for [facility] 04/04. [place] MUST #[id number])  Discharge Plan: SNF    Current Diagnoses: Patient Active Problem List   Diagnosis Date Noted  . Malnutrition of moderate degree 10/01/2017  . Fall 09/29/2017  . Rhabdomyolysis 09/29/2017  . Acute renal failure (ARF) (HCC) 09/29/2017  . Elevated troponin 09/29/2017  . Bilateral lower extremity edema 08/15/2015  . Hyperlipidemia 08/15/2015  . Paroxysmal supraventricular tachycardia (HCC) 12/06/2013  . Essential hypertension, benign 12/06/2013  . Syncope and collapse 12/06/2013  . Edema 12/06/2013    Orientation RESPIRATION BLADDER Height & Weight     Self  O2(Nasal Cannula; 2L ) Incontinent, External catheter(External Catheter placed 09/29/2017 ) Weight: 128 lb 8.5 oz (58.3 kg) Height:  5\' 4"  (162.6 cm)  BEHAVIORAL SYMPTOMS/MOOD NEUROLOGICAL BOWEL NUTRITION STATUS      Incontinent Diet(Heart healthy; fluid consistency- Thin. )  AMBULATORY STATUS COMMUNICATION OF NEEDS Skin   Limited Assist Verbally Skin abrasions(Bottom of left foot.)                       Personal Care Assistance Level of Assistance  Bathing, Feeding, Dressing Bathing Assistance: Limited assistance Feeding assistance: Limited assistance Dressing Assistance: Limited assistance     Functional  Limitations Info  Sight, Hearing, Speech Sight Info: Adequate Hearing Info: Adequate Speech Info: Adequate    SPECIAL CARE FACTORS FREQUENCY  PT (By licensed PT)     PT Frequency: Evaluated 04/03 and a minimum of 2x per week recommended during acute inpatient stay.               Contractures Contractures Info: Not present    Additional Factors Info  Code Status Code Status Info: DNR             Current Medications (10/02/2017):  This is the current hospital active medication list Current Facility-Administered Medications  Medication Dose Route Frequency Provider Last Rate Last Dose  . acetaminophen (TYLENOL) tablet 650 mg  650 mg Oral Q6H PRN Jonah Blue, MD       Or  . acetaminophen (TYLENOL) suppository 650 mg  650 mg Rectal Q6H PRN Jonah Blue, MD      . dextrose 5 %-0.45 % sodium chloride infusion   Intravenous Continuous Briant Cedar, MD 100 mL/hr at 10/01/17 0931    . docusate sodium (COLACE) capsule 100 mg  100 mg Oral BID Jonah Blue, MD   100 mg at 10/02/17 0956  . enoxaparin (LOVENOX) injection 30 mg  30 mg Subcutaneous Q24H Jonah Blue, MD   30 mg at 10/01/17 2320  . feeding supplement (ENSURE ENLIVE) (ENSURE ENLIVE) liquid 237 mL  237 mL Oral BID BM Rai, Ripudeep K, MD   237 mL at 10/02/17 0956  . metoprolol succinate (TOPROL-XL) 24 hr tablet 25 mg  25 mg Oral Daily Jonah Blue, MD  25 mg at 10/02/17 0956  . ondansetron (ZOFRAN) tablet 4 mg  4 mg Oral Q6H PRN Jonah BlueYates, Jennifer, MD       Or  . ondansetron [facility](ZOFRAN) injection 4 mg  4 mg Intravenous Q6H PRN Jonah BlueYates, Jennifer, MD      . sodium chloride flush (NS) 0.9 % injection 3 mL  3 mL Intravenous Q12H Jonah BlueYates, Jennifer, MD   3 mL at 10/01/17 2321     Discharge Medications: Please see discharge summary for a list of discharge medications.  Relevant Imaging Results:  Relevant Lab Results:   Additional Information ssn [id number]  Maxwell Marionamara T Zenola Dezarn, Student-Social  Work [phone]

## 2017-10-02 NOTE — Progress Notes (Signed)
PROGRESS NOTE  Madeline SchilderMargaret M Parrish UJW:[id number] DOB: 1921/12/27 DOA: 09/29/2017 PCP: Iva BoopVia, Kevin, MD  HPI/Recap of past 24 hours: Madeline SchilderMargaret M Parrish is a 84 y.o. female with tinnitus; HTN; stage 3 CKD; restrictive lung disease; and depression presenting after a fall. "I just fell". Her brother at the bedside was not sure when she fell, as the patient lives alone.  Patient reported that she tripped on the side of the bed. Declining health x 6 months. In the ED, noted elevated CK and troponin. No EKG changes, chest pain. Creatinine doubled from baseline. Head and neck CT negative.  Patient admitted for further management.  Today, patient denies any new symptoms, denies any difficulty breathing, shortness of breath, chest pain, abdominal pain, fever/chills.  Assessment/Plan: Principal Problem:   Fall Active Problems:   Essential hypertension, benign   Rhabdomyolysis   Acute renal failure (ARF) (HCC)   Elevated troponin   Malnutrition of moderate degree  Mechanical fall CT head and neck unremarkable PT OT on board, recommend SNF  Rhabdomyolysis CK downtrending Likely due to fall, patient down for an unknown duration  CK 3473 at the time of admission Continue IV fluid hydration, reduce rate to 75 cc Daily CK levels  Acute kidney injury Resolving Baseline creatinine 1.0, presenting with a creatinine function of 2 Likely due to acute rhabdomyolysis Continue IV fluid hydration  Elevated troponin Chest pain-free Likely due to rhabdomyolysis and acute kidney injury Troponin with flat trend EKG with no acute ST-T wave changes suggestive of ischemia  Echo showed EF of 60-65%, with grade 1 diastolic dysfunction  Benign essential hypertension Continue Toprol-XL  Dementia UA negative for UTI, appears to have some underlying dementia, unclear baseline CT head negative for acute stroke, showed chronic small vessel disease, no acute intracranial abnormality PT OT  evaluation    Code Status: DNR  Family Communication: Spoke with brother at bedside  Disposition Plan: SNF   Consultants:  None  Procedures:  None  Antimicrobials:  None  DVT prophylaxis: Lovenox   Objective: Vitals:   10/01/17 1714 10/01/17 2116 10/02/17 0637 10/02/17 1639  BP: 140/79 132/64 124/63 (!) 173/94  Pulse: 89 92 82 (!) 46  Resp: 18 19 19 18   Temp: 97.9 F (36.6 C) 98.3 F (36.8 C) 97.8 F (36.6 C) 98.3 F (36.8 C)  TempSrc: Oral Oral Oral Oral  SpO2: 93% 98% 99% 94%  Weight:  58.3 kg (128 lb 8.5 oz)    Height:        Intake/Output Summary (Last 24 hours) at 10/02/2017 1653 Last data filed at 10/02/2017 1100 Gross per 24 hour  Intake 1391.33 ml  Output 400 ml  Net 991.33 ml   Filed Weights   09/29/17 2239 10/01/17 2116  Weight: 58.2 kg (128 lb 4.9 oz) 58.3 kg (128 lb 8.5 oz)    Exam:   General: NAD  Cardiovascular: S1, S2 present  Respiratory: Chest clear to auscultation bilaterally  Abdomen: Soft, nontender, nondistended, bowel sounds present  Musculoskeletal: No pedal edema bilaterally  Skin: Normal  Psychiatry: Normal mood   Data Reviewed: CBC: Recent Labs  Lab 09/29/17 1127 09/30/17 0229 09/30/17 0728 10/01/17 0725 10/02/17 0627  WBC 13.5* 10.9* 10.1 11.4* 10.3  NEUTROABS 11.4*  --   --   --   --   HGB 13.8 12.4 12.0 14.6 13.9  HCT 42.6 37.8 37.6 44.0 42.8  MCV 95.1 94.7 95.4 93.6 94.3  PLT 252 232 232 250 227   Basic Metabolic Panel: Recent  Labs  Lab 09/29/17 1127 09/29/17 2124 09/30/17 0728 10/01/17 0725 10/02/17 0627  NA 145 144 144 141 141  K 3.8 3.5 3.5 3.0* 3.5  CL 110 112* 113* 105 108  CO2 22 23 23 23 25   GLUCOSE 128* 123* 86 118* 96  BUN 30* 36* 37* 20 19  CREATININE 2.12* 1.99* 1.46* 1.04* 1.14*  CALCIUM 9.1 8.2* 8.2* 8.4* 8.1*   GFR: Estimated Creatinine Clearance: 26.6 mL/min (A) (by C-G formula based on SCr of 1.14 mg/dL (H)). Liver Function Tests: Recent Labs  Lab 09/29/17 1127  09/29/17 2124 09/30/17 0728  AST 91* 113* 108*  ALT 30 32 36  ALKPHOS 57 51 43  BILITOT 1.1 1.0 1.1  PROT 6.0* 5.3* 5.0*  ALBUMIN 3.2* 2.7* 2.5*   No results for input(s): LIPASE, AMYLASE in the last 168 hours. No results for input(s): AMMONIA in the last 168 hours. Coagulation Profile: No results for input(s): INR, PROTIME in the last 168 hours. Cardiac Enzymes: Recent Labs  Lab 09/29/17 1127 09/29/17 1901 09/29/17 2124 09/30/17 0229 09/30/17 0728 10/02/17 0627  CKTOTAL 3,473*  --  3,451*  --  3,002* 1,109*  TROPONINI  --  0.92*  --  0.88* 0.73*  --    BNP (last 3 results) No results for input(s): PROBNP in the last 8760 hours. HbA1C: No results for input(s): HGBA1C in the last 72 hours. CBG: No results for input(s): GLUCAP in the last 168 hours. Lipid Profile: No results for input(s): CHOL, HDL, LDLCALC, TRIG, CHOLHDL, LDLDIRECT in the last 72 hours. Thyroid Function Tests: No results for input(s): TSH, T4TOTAL, FREET4, T3FREE, THYROIDAB in the last 72 hours. Anemia Panel: No results for input(s): VITAMINB12, FOLATE, FERRITIN, TIBC, IRON, RETICCTPCT in the last 72 hours. Urine analysis:    Component Value Date/Time   COLORURINE YELLOW 09/29/2017 1702   APPEARANCEUR CLEAR 09/29/2017 1702   LABSPEC 1.015 09/29/2017 1702   PHURINE 6.0 09/29/2017 1702   GLUCOSEU NEGATIVE 09/29/2017 1702   HGBUR MODERATE (A) 09/29/2017 1702   BILIRUBINUR NEGATIVE 09/29/2017 1702   KETONESUR 5 (A) 09/29/2017 1702   PROTEINUR 30 (A) 09/29/2017 1702   NITRITE NEGATIVE 09/29/2017 1702   LEUKOCYTESUR NEGATIVE 09/29/2017 1702   Sepsis Labs: @LABRCNTIP (procalcitonin:4,lacticidven:4)  )No results found for this or any previous visit (from the past 240 hour(s)).    Studies: No results found.  Scheduled Meds: . docusate sodium  100 mg Oral BID  . enoxaparin (LOVENOX) injection  30 mg Subcutaneous Q24H  . feeding supplement (ENSURE ENLIVE)  237 mL Oral BID BM  . metoprolol  succinate  25 mg Oral Daily  . sodium chloride flush  3 mL Intravenous Q12H    Continuous Infusions: . dextrose 5 % and 0.45% NaCl 100 mL/hr at 10/01/17 0931     LOS: 2 days     Briant Cedar, MD [facility]   If 7PM-7AM, please contact night-coverage [email or web address] Password [email or web address] 10/02/2017, 4:53 PM

## 2017-10-02 NOTE — Clinical Social Work Note (Signed)
Clinical Social Work Assessment  Patient Details  Name: Madeline Parrish MRN: [id number] Date of Birth: 02/27/1922  Date of referral:  09/30/17               Reason for consult:  Facility Placement, Discharge Planning                Permission sought to share information with:  Family Supports Permission granted to share information::  Yes, Verbal Permission Granted  Name::     Madeline Parrish  Agency::     Relationship::  Brother  Contact Information:  [phone]  Housing/Transportation Living arrangements for the past 2 months:  Single Family Home Source of Information:  Siblings Patient Interpreter Needed:  None Criminal Activity/Legal Involvement Pertinent to Current Situation/Hospitalization:  No - Comment as needed Significant Relationships:  Siblings Lives with:  Self Do you feel safe going back to the place where you live?  No(Patient in agreement with ST rehab) Need for family participation in patient care:  Yes (Comment)  Care giving concerns:  Mr. Madeline Parrish expressed concern about his sister discharging home as she lives alone and cannot care for herself, and per brother, he cannot care for her.  Social Worker assessment / plan: on 4/2 CSW talked with patient and her brother, Mr. Madeline Parrish a the bedside regarding discharge disposition and recommendation of ST rehab. Madeline Parrish was sitting up in bed and was alert and greeted CSW, but was alert to self only on that date. When asked, Madeline Parrish responded that she lives in a one-level home. CSW also advised that patient has a daughter Madeline Parrish, who lives in [place] and they have a brother who live in the area.   Brother reported that his sister has never been to ST rehab, and process of placement explained and SNF list provided. Brother reported that their brother has been to [facility] before and he knows nothing about any of the other SNF's in [place]. Mr. Madeline Parrish advised that he will be provided with  facility responses so that he can make a decision on the SNF he wants his sister to discharge to for ST rehab.    Employment status:  Retired Health and safety inspectornsurance information:  [employer] PT Recommendations:  Skilled Nursing Facility Information / Referral to community resources:  Skilled Nursing Facility([facility]  Patient/Family's Response to care:  No concerns expressed by patient or brother regarding care during hospitalization.  Patient/Family's Understanding of and Emotional Response to Diagnosis, Current Treatment, and Prognosis: Mr. Madeline Parrish expressed understanding of his sister's need for ST rehab.   Emotional Assessment Appearance:  Appears stated age Attitude/Demeanor/Rapport:  Other(Appropriate) Affect (typically observed):  Pleasant Orientation:  Oriented to Self Alcohol / Substance use:  Tobacco Use, Alcohol Use, Illicit Drugs(Patient reported that she has never smoked. No record of alcohol or drug use) Psych involvement (Current and /or in the community):  No (Comment)  Discharge Needs  Concerns to be addressed:  Discharge Planning Concerns Readmission within the last 30 days:  No Current discharge risk:  None Barriers to Discharge:  Continued Medical Work up   [employer], Lazaro ArmsVanessa Bradley, LCSW 10/02/2017, 1:27 PM

## 2017-10-02 NOTE — Clinical Social Work Note (Signed)
CSW talked by phone with patient's Mr. Loma SenderMcNeely, patient's brother regarding his facility choice. [facility] chosen and CSW contacted facility and left message for [facility] director, [place]. CSW will follow-up with facility director on Friday regarding discharge.  Genelle BalVanessa Jovonna Nickell, MSW, LCSW Licensed Clinical Social Worker Clinical Social Work Department [facility] [phone]

## 2017-10-03 DIAGNOSIS — R609 Edema, unspecified: Secondary | ICD-10-CM | POA: Diagnosis not present

## 2017-10-03 DIAGNOSIS — J984 Other disorders of lung: Secondary | ICD-10-CM | POA: Diagnosis not present

## 2017-10-03 DIAGNOSIS — D649 Anemia, unspecified: Secondary | ICD-10-CM | POA: Diagnosis not present

## 2017-10-03 DIAGNOSIS — R488 Other symbolic dysfunctions: Secondary | ICD-10-CM | POA: Diagnosis not present

## 2017-10-03 DIAGNOSIS — N179 Acute kidney failure, unspecified: Secondary | ICD-10-CM | POA: Diagnosis not present

## 2017-10-03 DIAGNOSIS — T796XXD Traumatic ischemia of muscle, subsequent encounter: Secondary | ICD-10-CM | POA: Diagnosis not present

## 2017-10-03 DIAGNOSIS — N183 Chronic kidney disease, stage 3 (moderate): Secondary | ICD-10-CM | POA: Diagnosis not present

## 2017-10-03 DIAGNOSIS — E870 Hyperosmolality and hypernatremia: Secondary | ICD-10-CM | POA: Diagnosis not present

## 2017-10-03 DIAGNOSIS — I10 Essential (primary) hypertension: Secondary | ICD-10-CM | POA: Diagnosis not present

## 2017-10-03 DIAGNOSIS — W19XXXD Unspecified fall, subsequent encounter: Secondary | ICD-10-CM | POA: Diagnosis not present

## 2017-10-03 DIAGNOSIS — W19XXXS Unspecified fall, sequela: Secondary | ICD-10-CM | POA: Diagnosis not present

## 2017-10-03 DIAGNOSIS — R748 Abnormal levels of other serum enzymes: Secondary | ICD-10-CM | POA: Diagnosis not present

## 2017-10-03 DIAGNOSIS — I129 Hypertensive chronic kidney disease with stage 1 through stage 4 chronic kidney disease, or unspecified chronic kidney disease: Secondary | ICD-10-CM | POA: Diagnosis not present

## 2017-10-03 DIAGNOSIS — R278 Other lack of coordination: Secondary | ICD-10-CM | POA: Diagnosis not present

## 2017-10-03 DIAGNOSIS — T796XXS Traumatic ischemia of muscle, sequela: Secondary | ICD-10-CM | POA: Diagnosis not present

## 2017-10-03 DIAGNOSIS — E440 Moderate protein-calorie malnutrition: Secondary | ICD-10-CM | POA: Diagnosis not present

## 2017-10-03 DIAGNOSIS — I471 Supraventricular tachycardia: Secondary | ICD-10-CM | POA: Diagnosis not present

## 2017-10-03 DIAGNOSIS — M6282 Rhabdomyolysis: Secondary | ICD-10-CM | POA: Diagnosis not present

## 2017-10-03 DIAGNOSIS — R4182 Altered mental status, unspecified: Secondary | ICD-10-CM | POA: Diagnosis not present

## 2017-10-03 DIAGNOSIS — T796XXA Traumatic ischemia of muscle, initial encounter: Secondary | ICD-10-CM | POA: Diagnosis not present

## 2017-10-03 DIAGNOSIS — Z9181 History of falling: Secondary | ICD-10-CM | POA: Diagnosis not present

## 2017-10-03 DIAGNOSIS — R600 Localized edema: Secondary | ICD-10-CM | POA: Diagnosis not present

## 2017-10-03 DIAGNOSIS — M6281 Muscle weakness (generalized): Secondary | ICD-10-CM | POA: Diagnosis not present

## 2017-10-03 LAB — BASIC METABOLIC PANEL
Anion gap: 8 (ref 5–15)
BUN: 19 mg/dL (ref 6–20)
CO2: 23 mmol/L (ref 22–32)
Calcium: 8.3 mg/dL — ABNORMAL LOW (ref 8.9–10.3)
Chloride: 109 mmol/L (ref 101–111)
Creatinine, Ser: 1.34 mg/dL — ABNORMAL HIGH (ref 0.44–1.00)
GFR, EST AFRICAN AMERICAN: 38 mL/min — AB (ref >60)
GFR, EST NON AFRICAN AMERICAN: 33 mL/min — AB (ref >60)
Glucose, Bld: 99 mg/dL (ref 65–99)
POTASSIUM: 3.7 mmol/L (ref 3.5–5.1)
SODIUM: 140 mmol/L (ref 135–145)

## 2017-10-03 LAB — CBC
HEMATOCRIT: 44.5 % (ref 36.0–46.0)
Hemoglobin: 14.3 g/dL (ref 12.0–15.0)
MCH: 31 pg (ref 26.0–34.0)
MCHC: 32.1 g/dL (ref 30.0–36.0)
MCV: 96.5 fL (ref 78.0–100.0)
Platelets: 225 10*3/uL (ref 150–400)
RBC: 4.61 MIL/uL (ref 3.87–5.11)
RDW: 15 % (ref 11.5–15.5)
WBC: 10 10*3/uL (ref 4.0–10.5)

## 2017-10-03 LAB — CK: Total CK: 544 U/L — ABNORMAL HIGH (ref 38–234)

## 2017-10-03 MED ORDER — HYDRALAZINE HCL 20 MG/ML IJ SOLN
10.0000 mg | Freq: Once | INTRAMUSCULAR | Status: AC
  Administered 2017-10-03: 10 mg via INTRAVENOUS
  Filled 2017-10-03: qty 1

## 2017-10-03 NOTE — Progress Notes (Signed)
Attempted to call [facility] to [facility] but was informed they were in report. This RN left her contact number and was told they would call back after report.

## 2017-10-03 NOTE — Discharge Summary (Signed)
Discharge Summary  Madeline Parrish:[id number] DOB: 12/06/1921  PCP: Iva Boop, MD  Admit date: 09/29/2017 Discharge date: 10/03/2017  Time spent: 35 mins  Recommendations for Outpatient Follow-up:  1. PCP to follow up repeat labs  Discharge Diagnoses:  Active Hospital Problems   Diagnosis Date Noted  . Fall 09/29/2017  . Malnutrition of moderate degree 10/01/2017  . Rhabdomyolysis 09/29/2017  . Acute renal failure (ARF) (HCC) 09/29/2017  . Elevated troponin 09/29/2017  . Essential hypertension, benign 12/06/2013    Resolved Hospital Problems  No resolved problems to display.    Discharge Condition: Stable  Diet recommendation: Heart healthy  Vitals:   10/03/17 0733 10/03/17 0940  BP: (!) 183/105 (!) 143/71  Pulse: 80 79  Resp: 16   Temp: 97.6 F (36.4 C)   SpO2: 92%     History of present illness:  Madeline Eppes Schmidtis a 84 y.o.femalewithtinnitus; HTN; stage 3 CKD; restrictive lung disease; and depression presenting after a fall. "I just fell". Her brother at the bedside was not sure when she fell,as the patient lives alone. Patient reported that she tripped on the side of the bed. Declining health x 6 months. In the ED, noted elevated CK and troponin. No EKG changes, chest pain. Creatinine doubled from baseline. Head and neck CT negative.  Patient admitted for further management.  Today, patient denies any new symptoms, denies any difficulty breathing, shortness of breath, chest pain. Pt is stable to be discharged to SNF for further rehab  Hospital Course:  Principal Problem:   Fall Active Problems:   Essential hypertension, benign   Rhabdomyolysis   Acute renal failure (ARF) (HCC)   Elevated troponin   Malnutrition of moderate degree  Mechanical fall CT head and neck unremarkable PT OT on board, recommend SNF  Rhabdomyolysis CK downtrending from 3473--->544 Likely due to fall, patient down for an unknown duration  S/P IV fluid  hydration  Acute kidney injury Resolving Baseline creatinine 1.0, presenting with a creatinine function of 2 Likely due to acute rhabdomyolysis Encourage adequate oral intake to prevent dehydration and malnutrition  Elevated troponin Chest pain-free Likely due to rhabdomyolysis and acute kidney injury Troponin with flat trend EKG with no acute ST-T wave changes suggestive of ischemia Echo showed EF of 60-65%, with grade 1 diastolic dysfunction  Benign essential hypertension Continue Toprol-XL  Dementia UA negative for UTI, appears to have some underlying dementia, unclear baseline CT head negative for acute stroke, showed chronic small vessel disease, no acute intracranial abnormality   Procedures:  None  Consultations:  None  Discharge Exam: BP (!) 143/71   Pulse 79   Temp 97.6 F (36.4 C) (Oral)   Resp 16   Ht 5\' 4"  (1.626 m)   Wt 54.4 kg (120 lb)   SpO2 92%   BMI 20.60 kg/m   General: Alert, NAD Cardiovascular: S1, S2 present Respiratory: Chest clear to auscultation  Discharge Instructions You were cared for by a hospitalist during your hospital stay. If you have any questions about your discharge medications or the care you received while you were in the hospital after you are discharged, you can call the unit and asked to speak with the hospitalist on call if the hospitalist that took care of you is not available. Once you are discharged, your primary care physician will handle any further medical issues. Please note that NO REFILLS for any discharge medications will be authorized once you are discharged, as it is imperative that you return to your  primary care physician (or establish a relationship with a primary care physician if you do not have one) for your aftercare needs so that they can reassess your need for medications and monitor your lab values.   Allergies as of 10/03/2017   No Known Allergies     Medication List    TAKE these medications    metoprolol succinate 25 MG 24 hr tablet Commonly known as:  TOPROL-XL Take 25 mg by mouth daily.   ONE-A-DAY WOMENS 50+ ADVANTAGE Tabs Take 1 tablet by mouth daily.      No Known Allergies Follow-up Information    Via, Caryn Bee, MD. Schedule an appointment as soon as possible for a visit in 1 week(s).   Specialty:  Family Medicine Contact information: [address] [place] [address] [phone]            The results of significant diagnostics from this hospitalization (including imaging, microbiology, ancillary and laboratory) are listed below for reference.    Significant Diagnostic Studies: Dg Chest 2 View  Result Date: 09/29/2017 CLINICAL DATA:  Altered mental status EXAM: CHEST - 2 VIEW COMPARISON:  None FINDINGS: Mild cardiac enlargement without heart failure. Mild bibasilar airspace disease, most likely atelectasis. Negative for effusion or mass. IMPRESSION: Mild bibasilar atelectasis. Electronically Signed   By: Marlan Palau M.D.   On: 09/29/2017 14:33   Ct Head Wo Contrast  Result Date: 09/29/2017 CLINICAL DATA:  84 year old female with recent altered mental status, progressive for the past 2 days. EXAM: CT HEAD WITHOUT CONTRAST CT CERVICAL SPINE WITHOUT CONTRAST TECHNIQUE: Multidetector CT imaging of the head and cervical spine was performed following the standard protocol without intravenous contrast. Multiplanar CT image reconstructions of the cervical spine were also generated. COMPARISON:  None. FINDINGS: CT HEAD FINDINGS Brain: No ventriculomegaly. No midline shift, mass effect, or evidence of intracranial mass lesion. Small chronic appearing lacunar infarcts in both cerebellar hemispheres, the right thalamus, left basal ganglia. Additional bilateral cerebral white matter hypodensity. No cortical encephalomalacia. No cortically based acute infarct identified. No intracranial hemorrhage identified. Vascular: Calcified atherosclerosis at the skull base. No  suspicious intracranial vascular hyperdensity. Skull: Osteopenia.  Visualized skull base is intact. Sinuses/Orbits: Small low-density fluid levels in the left sphenoid and right maxillary sinuses. Otherwise clear. Other: Broad-based scalp hematoma traversing the vertex measuring 5 millimeters in thickness. Superimposed more focal 5-6 millimeter left posterior convexity scalp hematoma on series 5, image 66. Underlying calvarium intact. CT CERVICAL SPINE FINDINGS Alignment: Relatively preserved cervical lordosis. Subtle anterolisthesis of C7 on T1. Bilateral posterior element alignment is within normal limits. Skull base and vertebrae: No atlanto-occipital dissociation. No cervical spine fracture identified. Soft tissues and spinal canal: No prevertebral fluid or swelling. No visible canal hematoma. Negative noncontrast neck soft tissues aside from mild calcified carotid atherosclerosis. Disc levels: Widespread cervical disc space loss and endplate spurring. Intermittent facet hypertrophy. Only borderline to mild lower cervical spine degenerative spinal stenosis suspected such as at C5-C6. Upper chest: Visible upper thoracic levels appear intact. Negative lung apices aside from possible emphysema. Tortuous proximal great vessels. IMPRESSION: 1. Scalp hematoma with no underlying skull fracture identified. 2. Chronic small vessel disease. No acute intracranial abnormality identified. 3. No acute fracture or listhesis identified in the cervical spine. 4. 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 Electronically Signed   By: Odessa Fleming M.D.   On: 09/29/2017 13:00   Ct Cervical Spine Wo Contrast  Result Date: 09/29/2017 CLINICAL DATA:  84 year old female with  recent altered mental status, progressive for the past 2 days. EXAM: CT HEAD WITHOUT CONTRAST CT CERVICAL SPINE WITHOUT CONTRAST TECHNIQUE: Multidetector CT imaging of the head and cervical spine was performed following the standard  protocol without intravenous contrast. Multiplanar CT image reconstructions of the cervical spine were also generated. COMPARISON:  None. FINDINGS: CT HEAD FINDINGS Brain: No ventriculomegaly. No midline shift, mass effect, or evidence of intracranial mass lesion. Small chronic appearing lacunar infarcts in both cerebellar hemispheres, the right thalamus, left basal ganglia. Additional bilateral cerebral white matter hypodensity. No cortical encephalomalacia. No cortically based acute infarct identified. No intracranial hemorrhage identified. Vascular: Calcified atherosclerosis at the skull base. No suspicious intracranial vascular hyperdensity. Skull: Osteopenia.  Visualized skull base is intact. Sinuses/Orbits: Small low-density fluid levels in the left sphenoid and right maxillary sinuses. Otherwise clear. Other: Broad-based scalp hematoma traversing the vertex measuring 5 millimeters in thickness. Superimposed more focal 5-6 millimeter left posterior convexity scalp hematoma on series 5, image 66. Underlying calvarium intact. CT CERVICAL SPINE FINDINGS Alignment: Relatively preserved cervical lordosis. Subtle anterolisthesis of C7 on T1. Bilateral posterior element alignment is within normal limits. Skull base and vertebrae: No atlanto-occipital dissociation. No cervical spine fracture identified. Soft tissues and spinal canal: No prevertebral fluid or swelling. No visible canal hematoma. Negative noncontrast neck soft tissues aside from mild calcified carotid atherosclerosis. Disc levels: Widespread cervical disc space loss and endplate spurring. Intermittent facet hypertrophy. Only borderline to mild lower cervical spine degenerative spinal stenosis suspected such as at C5-C6. Upper chest: Visible upper thoracic levels appear intact. Negative lung apices aside from possible emphysema. Tortuous proximal great vessels. IMPRESSION: 1. Scalp hematoma with no underlying skull fracture identified. 2. Chronic small  vessel disease. No acute intracranial abnormality identified. 3. No acute fracture or listhesis identified in the cervical spine. 4. 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 Electronically Signed   By: Odessa FlemingH  Hall M.D.   On: 09/29/2017 13:00    Microbiology: No results found for this or any previous visit (from the past 240 hour(s)).   Labs: Basic Metabolic Panel: Recent Labs  Lab 09/29/17 2124 09/30/17 0728 10/01/17 0725 10/02/17 0627 10/03/17 0739  NA 144 144 141 141 140  K 3.5 3.5 3.0* 3.5 3.7  CL 112* 113* 105 108 109  CO2 23 23 23 25 23   GLUCOSE 123* 86 118* 96 99  BUN 36* 37* 20 19 19   CREATININE 1.99* 1.46* 1.04* 1.14* 1.34*  CALCIUM 8.2* 8.2* 8.4* 8.1* 8.3*   Liver Function Tests: Recent Labs  Lab 09/29/17 1127 09/29/17 2124 09/30/17 0728  AST 91* 113* 108*  ALT 30 32 36  ALKPHOS 57 51 43  BILITOT 1.1 1.0 1.1  PROT 6.0* 5.3* 5.0*  ALBUMIN 3.2* 2.7* 2.5*   No results for input(s): LIPASE, AMYLASE in the last 168 hours. No results for input(s): AMMONIA in the last 168 hours. CBC: Recent Labs  Lab 09/29/17 1127 09/30/17 0229 09/30/17 0728 10/01/17 0725 10/02/17 0627 10/03/17 0739  WBC 13.5* 10.9* 10.1 11.4* 10.3 10.0  NEUTROABS 11.4*  --   --   --   --   --   HGB 13.8 12.4 12.0 14.6 13.9 14.3  HCT 42.6 37.8 37.6 44.0 42.8 44.5  MCV 95.1 94.7 95.4 93.6 94.3 96.5  PLT 252 232 232 250 227 225   Cardiac Enzymes: Recent Labs  Lab 09/29/17 1127 09/29/17 1901 09/29/17 2124 09/30/17 0229 09/30/17 0728 10/02/17 0627 10/03/17 0739  CKTOTAL 3,473*  --  3,451*  --  3,002* 1,109* 544*  TROPONINI  --  0.92*  --  0.88* 0.73*  --   --    BNP: BNP (last 3 results) No results for input(s): BNP in the last 8760 hours.  ProBNP (last 3 results) No results for input(s): PROBNP in the last 8760 hours.  CBG: No results for input(s): GLUCAP in the last 168 hours.     Signed:  Briant Cedar, MD [facility] 10/03/2017, 12:52 PM

## 2017-10-03 NOTE — Care Management Important Message (Signed)
Important Message  Patient Details  Name: Madeline Parrish MRN: [id number] Date of Birth: April 07, 1922   Medicare Important Message Given:  Yes    [name] 10/03/2017, 1:24 PM

## 2017-10-03 NOTE — Clinical Social Work Placement (Signed)
   CLINICAL SOCIAL WORK PLACEMENT  NOTE **DISCHARGED TO [facility]**  Date:  10/03/2017  Patient Details  Name: Adele SchilderMargaret M Lannen MRN: [id number] Date of Birth: [birth year]  Clinical Social Work is seeking post-discharge placement for this patient at the Skilled  Nursing Facility level of care (*CSW will initial, date and re-position this form in  chart as items are completed):  Yes   Patient/family provided with [place] Clinical Social Work Department's list of facilities offering this level of care within the geographic area requested by the patient (or if unable, by the patient's family).  Yes   Patient/family informed of their freedom to choose among providers that offer the needed level of care, that participate in Medicare, Medicaid or managed care program needed by the patient, have an available bed and are willing to accept the patient.  Yes   Patient/family informed of [facility] ownership interest in [facility] and [facility], as well as of the fact that they are under no obligation to receive care at these facilities.  PASRR submitted to EDS on 10/02/17     PASRR number received on 10/03/17([phone])     Existing PASRR number confirmed on       FL2 transmitted to all facilities in geographic area requested by pt/family on 10/02/17     FL2 transmitted to all facilities within larger geographic area on       Patient informed that his/her managed care company has contracts with or will negotiate with certain facilities, including the following:        Yes   Patient/family informed of bed offers received.  [facility]     Physician recommends and patient chooses bed at      Patient to be [facility] on 10/03/17.  Patient to be transferred to facility by Ambulance     Patient family notified on 10/03/17 of transfer.  Name of family member notified:   Brother Mamie Laurelichard McNeely by phone and at the bedside     PHYSICIAN       Additional Comment:    _______________________________________________ Cristobal Goldmannrawford, Cyntia Staley Bradley, LCSW 10/03/2017, 3:37 PM

## 2017-10-03 NOTE — Progress Notes (Signed)
Patient left unit with PTAR. No personal belonging  Found at bedside to be sent with patient. Patient made aware.

## 2017-10-03 NOTE — Progress Notes (Signed)
[facility] back so this RN called report on pt.   Still awaiting EMS transport

## 2017-10-06 ENCOUNTER — Non-Acute Institutional Stay (SKILLED_NURSING_FACILITY): Payer: Medicare Other | Admitting: Adult Health

## 2017-10-06 ENCOUNTER — Encounter: Payer: Self-pay | Admitting: Adult Health

## 2017-10-06 DIAGNOSIS — J984 Other disorders of lung: Secondary | ICD-10-CM | POA: Diagnosis not present

## 2017-10-06 DIAGNOSIS — W19XXXD Unspecified fall, subsequent encounter: Secondary | ICD-10-CM | POA: Diagnosis not present

## 2017-10-06 DIAGNOSIS — E440 Moderate protein-calorie malnutrition: Secondary | ICD-10-CM | POA: Diagnosis not present

## 2017-10-06 DIAGNOSIS — I129 Hypertensive chronic kidney disease with stage 1 through stage 4 chronic kidney disease, or unspecified chronic kidney disease: Secondary | ICD-10-CM

## 2017-10-06 DIAGNOSIS — I471 Supraventricular tachycardia: Secondary | ICD-10-CM

## 2017-10-06 DIAGNOSIS — N183 Chronic kidney disease, stage 3 unspecified: Secondary | ICD-10-CM | POA: Insufficient documentation

## 2017-10-06 DIAGNOSIS — T796XXD Traumatic ischemia of muscle, subsequent encounter: Secondary | ICD-10-CM

## 2017-10-06 DIAGNOSIS — R600 Localized edema: Secondary | ICD-10-CM | POA: Diagnosis not present

## 2017-10-06 NOTE — Progress Notes (Signed)
[facility] Room Number: [id number] Place of Service:  SNF (31)   CODE STATUS: DNR  No Known Allergies  Chief Complaint  Patient presents with  . Hospitalization Follow-up    Hospital follow up    HPI:  She is a 84 year old who has been hospitalized from 09-29-17 through 10-03-17. She had suffered a fall at home; unsure how long she was on the floor. Her brother had reported that she has had declining health over the past 6 months.  She was treated for rhabdomyolysis; acute renal failure; and elevated troponin likely due to her rhabdomyolysis. She is here for short term rehab. More than likely this does represent a long term placement for her. She is unable to fully participate in the hpi or ros. She is wandering around the facility in a wheelchair.  There are no reports of further falls; no changes in appetite; no reports of behavioral issues. There are no nursing concerns at this time. She will continue to be followed for her chronic illnesses including: bilateral lower extremity edema; hypertension; and malnutrition.   Past Medical History:  Diagnosis Date  . Acne rosacea   . CKD (chronic kidney disease), stage III (HCC)   . Depression   . Hypertension   . Osteopenia   . PUD (peptic ulcer disease)   . Restrictive lung disease   . Shingles   . Tinnitus    and vertigo    History reviewed. No pertinent surgical history.  Social History   Socioeconomic History  . Marital status: Divorced    Spouse name: Not on file  . Number of children: Not on file  . Years of education: Not on file  . Highest education level: Not on file  Occupational History  . Occupation: retired  Engineer, productionocial Needs  . Financial resource strain: Not on file  . Food insecurity:    Worry: Not on file    Inability: Not on file  . Transportation needs:    Medical: Not on file    Non-medical: Not on file  Tobacco Use  . Smoking status: Never Smoker  . Smokeless  tobacco: Never Used  Substance and Sexual Activity  . Alcohol use: No  . Drug use: No  . Sexual activity: Not on file  Lifestyle  . Physical activity:    Days per week: Not on file    Minutes per session: Not on file  . Stress: Not on file  Relationships  . Social connections:    Talks on phone: Not on file    Gets together: Not on file    Attends religious service: Not on file    Active member of club or organization: Not on file    Attends meetings of clubs or organizations: Not on file    Relationship status: Not on file  . Intimate partner violence:    Fear of current or ex partner: Not on file    Emotionally abused: Not on file    Physically abused: Not on file    Forced sexual activity: Not on file  Other Topics Concern  . Not on file  Social History Narrative  . Not on file   Family History  Problem Relation Age of Onset  . CVA Mother       VITAL SIGNS BP (!) 147/80   Pulse 82   Temp 97.7 F (36.5 C)   Resp 18   Ht 5\' 4"  (1.626 m)  Wt 120 lb (54.4 kg)   BMI 20.60 kg/m   Outpatient Encounter Medications as of 10/06/2017  Medication Sig  . metoprolol succinate (TOPROL-XL) 25 MG 24 hr tablet Take 25 mg by mouth daily.   . Multiple Vitamins-Minerals (WOMENS 50+ ADVANCED PO) Take 1 tablet by mouth daily.  . [DISCONTINUED] Multiple Vitamins-Minerals (ONE-A-DAY WOMENS 50+ ADVANTAGE) TABS Take 1 tablet by mouth daily.   No facility-administered encounter medications on file as of 10/06/2017.      SIGNIFICANT DIAGNOSTIC EXAMS  TODAY:   09-29-17: chest x-ray: Mild bibasilar atelectasis.   09-29-17: ct of head and cervical spine: 1. Scalp hematoma with no underlying skull fracture identified. 2. Chronic small vessel disease. No acute intracranial abnormality identified. 3. No acute fracture or listhesis identified in the cervical spine. 4. 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   10-01-17: 2-d echo: - Left ventricle: The cavity  size was normal. Wall thickness was increased in a pattern of mild LVH. Systolic function was normal. The estimated ejection fraction was in the range of 60% to 65%.  Wall motion was normal; there were no regional wall motion abnormalities. Doppler parameters are consistent with abnormal left ventricular relaxation (grade 1 diastolic dysfunction). - Aortic valve: Calcified non coronary cusp. - Mitral valve: There was mild regurgitation.   LABS REVIEWED: TODAY:   09-29-17:wbc 13.5; hgb 13.8; hct 42.6; mcv 95.1; plt 252; glucose 128; bun 30; creat 2.12; k+ 3.8; na++ 145; ca 9.1; ast 113; albumin 2.7; CK 3473 10-03-17: wbc 10.0; hgb 14.3; hct 44.5; mcv 96.5; plt 225; glucose 99; bun 18; creat 1.34; k+ 3.7; na++ 140; ca 8.3; ck 544     Review of Systems  Unable to perform ROS: Other (confusion )    Physical Exam  Constitutional: She appears well-developed and well-nourished. No distress.  Thin   Neck: No thyromegaly present.  Cardiovascular: Normal rate, regular rhythm and intact distal pulses.  Murmur heard. 1/6  Pulmonary/Chest: Effort normal and breath sounds normal. No stridor. No respiratory distress.  Abdominal: Soft. Bowel sounds are normal. She exhibits no distension. There is no tenderness.  Musculoskeletal: She exhibits edema.  3+ lower extremity edema  Is able to move all extremities   Lymphadenopathy:    She has no cervical adenopathy.  Neurological: She is alert.  Skin: Skin is warm and dry. She is not diaphoretic.  Psychiatric: She has a normal mood and affect.    ASSESSMENT/ PLAN:  TODAY:   1. Benign hypertension with chronic kidney disease stageIII: stable b/p 147/80: will continue toprol xl 25 mg daily   2. Paroxysmal supraventricular tachycardia: heart rate stable will continue toprol xl 25 mg daily for rate control  3. Chronic kidney disease stage III: stable bun 19 creat 134.  4. Restrictive airway disease: is stable will continue to monitor her status.   5.  Bilateral lower extremity edema: worse: will begin lasix 20 mg daily with k+ 10 meq daily; will monitor her status.   6. Rhabdomyolysis: is improving her last CK 544; will monitor her status  7. Falls at home: will continue therapy as directed to improve upon her gait balance strength and adl training.  8. Malnutrition of moderate degree: albumin 2.7; will continue supplements per facility protocol    Will check cbc; bmp total ck on 10-13-17.     MD is aware of resident's narcotic use and is in agreement with current plan of care. We will attempt to wean resident as apropriate  Ok Edwards NP [facility]  Contact [phone] Monday through Friday 8am- 5pm  After hours call [phone]

## 2017-10-07 ENCOUNTER — Encounter: Payer: Self-pay | Admitting: Internal Medicine

## 2017-10-07 ENCOUNTER — Non-Acute Institutional Stay (SKILLED_NURSING_FACILITY): Payer: Medicare Other | Admitting: Internal Medicine

## 2017-10-07 DIAGNOSIS — N183 Chronic kidney disease, stage 3 unspecified: Secondary | ICD-10-CM

## 2017-10-07 DIAGNOSIS — T796XXS Traumatic ischemia of muscle, sequela: Secondary | ICD-10-CM

## 2017-10-07 DIAGNOSIS — W19XXXS Unspecified fall, sequela: Secondary | ICD-10-CM | POA: Diagnosis not present

## 2017-10-07 DIAGNOSIS — I471 Supraventricular tachycardia: Secondary | ICD-10-CM

## 2017-10-07 DIAGNOSIS — I10 Essential (primary) hypertension: Secondary | ICD-10-CM

## 2017-10-07 DIAGNOSIS — R600 Localized edema: Secondary | ICD-10-CM | POA: Diagnosis not present

## 2017-10-07 NOTE — Progress Notes (Signed)
Provider: Einar Crow  Location:   [facility] Room Number: [id number] Place of Service:  SNF (31)  PCP: ViaCaryn Bee, MD Patient Care Team: Via, Caryn Bee, MD as PCP - General (Family Medicine)  Extended Emergency Contact Information Primary Emergency Contact: McNeely,Richard Address: [address]          [place], [place] [address] [place] Home Phone: [phone] Relation: Brother  Code Status: DNR Goals of Care: Advanced Directive information Advanced Directives 10/07/2017  Does Patient Have a Medical Advance Directive? Yes  Type of Advance Directive Out of facility DNR (pink MOST or yellow form)  Does patient want to make changes to medical advance directive? No - Patient declined  Pre-existing out of facility DNR order (yellow form or pink MOST form) -      Chief Complaint  Patient presents with  . New Admit To SNF    New Admission Visit    HPI: Patient is a 84 y.o. female seen today for admission to SNF for therapy. Patient has h/o SVT , Hypertension, tinnitus, depression,CKD stage 3, and restrictive lung disease. Patient lives alone But has a brother who checks on her every day.  She states she is not sure but at some time at night she fell and her brother found her next morning.  Patient does not remember being in the hospital.  In the emergency room she had elevated CK and troponins and her creatinine was elevated.  CT scan of the head and neck were negative for any acute changes she was slowly hydrated.  Her echo showed EF of 60-65% with grade 1 diastolic dysfunction. She responded to hydration and was discharged to facility for therapy. She states she uses a cane to walk at home.  She is independent in her ADLs.  Stopped driving for the past few years.  Patient did not have any complains today. No Chest pain , SOB or Dizziness.  Past Medical History:  Diagnosis Date  . Acne rosacea   . CKD (chronic kidney disease), stage III  (HCC)   . Depression   . Hypertension   . Osteopenia   . PUD (peptic ulcer disease)   . Restrictive lung disease   . Shingles   . Tinnitus    and vertigo   History reviewed. No pertinent surgical history.  reports that she has never smoked. She has never used smokeless tobacco. She reports that she does not drink alcohol or use drugs. Social History   Socioeconomic History  . Marital status: Divorced    Spouse name: Not on file  . Number of children: Not on file  . Years of education: Not on file  . Highest education level: Not on file  Occupational History  . Occupation: retired  Engineer, production  . Financial resource strain: Not on file  . Food insecurity:    Worry: Not on file    Inability: Not on file  . Transportation needs:    Medical: Not on file    Non-medical: Not on file  Tobacco Use  . Smoking status: Never Smoker  . Smokeless tobacco: Never Used  Substance and Sexual Activity  . Alcohol use: No  . Drug use: No  . Sexual activity: Not on file  Lifestyle  . Physical activity:    Days per week: Not on file    Minutes per session: Not on file  . Stress: Not on file  Relationships  . Social connections:  Talks on phone: Not on file    Gets together: Not on file    Attends religious service: Not on file    Active member of club or organization: Not on file    Attends meetings of clubs or organizations: Not on file    Relationship status: Not on file  . Intimate partner violence:    Fear of current or ex partner: Not on file    Emotionally abused: Not on file    Physically abused: Not on file    Forced sexual activity: Not on file  Other Topics Concern  . Not on file  Social History Narrative  . Not on file    Functional Status Survey:    Family History  Problem Relation Age of Onset  . CVA Mother     Health Maintenance  Topic Date Due  . DEXA SCAN  11/10/2017 (Originally 12/28/1988)  . INFLUENZA VACCINE  01/29/2018  . TETANUS/TDAP   Discontinued  . PNA vac Low Risk Adult  Discontinued    No Known Allergies  Allergies as of 10/07/2017   No Known Allergies     Medication List        Accurate as of 10/07/17 12:08 PM. Always use your most recent med list.          furosemide 20 MG tablet Commonly known as:  LASIX Take 20 mg by mouth.   metoprolol succinate 25 MG 24 hr tablet Commonly known as:  TOPROL-XL Take 25 mg by mouth daily.   potassium chloride 10 MEQ tablet Commonly known as:  K-DUR,KLOR-CON Take 10 mEq by mouth daily.   WOMENS 50+ ADVANCED PO Take 1 tablet by mouth daily.   multivitamin-iron-minerals-folic acid chewable tablet Chew 1 tablet by mouth daily.       Review of Systems  Review of Systems  Constitutional: Negative for activity change, appetite change, chills, diaphoresis, fatigue and fever.  HENT: Negative for mouth sores, postnasal drip, rhinorrhea, sinus pain and sore throat.   Respiratory: Negative for apnea, cough, chest tightness, shortness of breath and wheezing.   Cardiovascular: Negative for chest pain, palpitations and leg swelling.  Gastrointestinal: Negative for abdominal distention, abdominal pain, constipation, diarrhea, nausea and vomiting.  Genitourinary: Negative for dysuria and frequency.  Musculoskeletal: Negative for arthralgias, joint swelling and myalgias.  Skin: Negative for rash.  Neurological: Negative for dizziness, syncope, weakness, light-headedness and numbness.  Psychiatric/Behavioral: Negative for behavioral problems, confusion and sleep disturbance.     Vitals:   10/07/17 1154  BP: 122/69  Pulse: 92  Resp: (!) 21  Temp: 98.1 F (36.7 C)  TempSrc: Oral  SpO2: 93%   There is no height or weight on file to calculate BMI. Physical Exam  Constitutional: She appears well-developed and well-nourished.  HENT:  Head: Normocephalic.  Mouth/Throat: Oropharynx is clear and moist.  Eyes: Pupils are equal, round, and reactive to light.  Neck: Neck  supple.  Cardiovascular: Normal rate and regular rhythm.  No murmur heard. Pulmonary/Chest: Effort normal and breath sounds normal. No stridor. No respiratory distress. She has no wheezes.  Musculoskeletal:  Moderate Edema Bilateral  Neurological: She is alert.  Patient knew the name of the city but did not know the year or month. Will follow all commands. Does not have any Focal deficits.  Skin: Skin is warm and dry.  Psychiatric: She has a normal mood and affect. Her behavior is normal. Thought content normal.    Labs reviewed: Basic Metabolic Panel: Recent Labs  10/01/17 0725 10/02/17 0627 10/03/17 0739  NA 141 141 140  K 3.0* 3.5 3.7  CL 105 108 109  CO2 23 25 23   GLUCOSE 118* 96 99  BUN 20 19 19   CREATININE 1.04* 1.14* 1.34*  CALCIUM 8.4* 8.1* 8.3*   Liver Function Tests: Recent Labs    09/29/17 1127 09/29/17 2124 09/30/17 0728  AST 91* 113* 108*  ALT 30 32 36  ALKPHOS 57 51 43  BILITOT 1.1 1.0 1.1  PROT 6.0* 5.3* 5.0*  ALBUMIN 3.2* 2.7* 2.5*   No results for input(s): LIPASE, AMYLASE in the last 8760 hours. No results for input(s): AMMONIA in the last 8760 hours. CBC: Recent Labs    09/29/17 1127  10/01/17 0725 10/02/17 0627 10/03/17 0739  WBC 13.5*   < > 11.4* 10.3 10.0  NEUTROABS 11.4*  --   --   --   --   HGB 13.8   < > 14.6 13.9 14.3  HCT 42.6   < > 44.0 42.8 44.5  MCV 95.1   < > 93.6 94.3 96.5  PLT 252   < > 250 227 225   < > = values in this interval not displayed.   Cardiac Enzymes: Recent Labs    09/29/17 1901  09/30/17 0229 09/30/17 0728 10/02/17 0627 10/03/17 0739  CKTOTAL  --    < >  --  3,002* 1,109* 544*  TROPONINI 0.92*  --  0.88* 0.73*  --   --    < > = values in this interval not displayed.   BNP: Invalid input(s): POCBNP No results found for: HGBA1C No results found for: TSH No results found for: VITAMINB12 No results found for: FOLATE No results found for: IRON, TIBC, FERRITIN  Imaging and Procedures obtained  prior to SNF admission: Dg Chest 2 View  Result Date: 09/29/2017 CLINICAL DATA:  Altered mental status EXAM: CHEST - 2 VIEW COMPARISON:  None FINDINGS: Mild cardiac enlargement without heart failure. Mild bibasilar airspace disease, most likely atelectasis. Negative for effusion or mass. IMPRESSION: Mild bibasilar atelectasis. Electronically Signed   By: Marlan Palau M.D.   On: 09/29/2017 14:33   Ct Head Wo Contrast  Result Date: 09/29/2017 CLINICAL DATA:  84 year old female with recent altered mental status, progressive for the past 2 days. EXAM: CT HEAD WITHOUT CONTRAST CT CERVICAL SPINE WITHOUT CONTRAST TECHNIQUE: Multidetector CT imaging of the head and cervical spine was performed following the standard protocol without intravenous contrast. Multiplanar CT image reconstructions of the cervical spine were also generated. COMPARISON:  None. FINDINGS: CT HEAD FINDINGS Brain: No ventriculomegaly. No midline shift, mass effect, or evidence of intracranial mass lesion. Small chronic appearing lacunar infarcts in both cerebellar hemispheres, the right thalamus, left basal ganglia. Additional bilateral cerebral white matter hypodensity. No cortical encephalomalacia. No cortically based acute infarct identified. No intracranial hemorrhage identified. Vascular: Calcified atherosclerosis at the skull base. No suspicious intracranial vascular hyperdensity. Skull: Osteopenia.  Visualized skull base is intact. Sinuses/Orbits: Small low-density fluid levels in the left sphenoid and right maxillary sinuses. Otherwise clear. Other: Broad-based scalp hematoma traversing the vertex measuring 5 millimeters in thickness. Superimposed more focal 5-6 millimeter left posterior convexity scalp hematoma on series 5, image 66. Underlying calvarium intact. CT CERVICAL SPINE FINDINGS Alignment: Relatively preserved cervical lordosis. Subtle anterolisthesis of C7 on T1. Bilateral posterior element alignment is within normal limits.  Skull base and vertebrae: No atlanto-occipital dissociation. No cervical spine fracture identified. Soft tissues and spinal canal: No prevertebral fluid or swelling.  No visible canal hematoma. Negative noncontrast neck soft tissues aside from mild calcified carotid atherosclerosis. Disc levels: Widespread cervical disc space loss and endplate spurring. Intermittent facet hypertrophy. Only borderline to mild lower cervical spine degenerative spinal stenosis suspected such as at C5-C6. Upper chest: Visible upper thoracic levels appear intact. Negative lung apices aside from possible emphysema. Tortuous proximal great vessels. IMPRESSION: 1. Scalp hematoma with no underlying skull fracture identified. 2. Chronic small vessel disease. No acute intracranial abnormality identified. 3. No acute fracture or listhesis identified in the cervical spine. 4. 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 Electronically Signed   By: Odessa Fleming M.D.   On: 09/29/2017 13:00   Ct Cervical Spine Wo Contrast  Result Date: 09/29/2017 CLINICAL DATA:  84 year old female with recent altered mental status, progressive for the past 2 days. EXAM: CT HEAD WITHOUT CONTRAST CT CERVICAL SPINE WITHOUT CONTRAST TECHNIQUE: Multidetector CT imaging of the head and cervical spine was performed following the standard protocol without intravenous contrast. Multiplanar CT image reconstructions of the cervical spine were also generated. COMPARISON:  None. FINDINGS: CT HEAD FINDINGS Brain: No ventriculomegaly. No midline shift, mass effect, or evidence of intracranial mass lesion. Small chronic appearing lacunar infarcts in both cerebellar hemispheres, the right thalamus, left basal ganglia. Additional bilateral cerebral white matter hypodensity. No cortical encephalomalacia. No cortically based acute infarct identified. No intracranial hemorrhage identified. Vascular: Calcified atherosclerosis at the skull base. No suspicious  intracranial vascular hyperdensity. Skull: Osteopenia.  Visualized skull base is intact. Sinuses/Orbits: Small low-density fluid levels in the left sphenoid and right maxillary sinuses. Otherwise clear. Other: Broad-based scalp hematoma traversing the vertex measuring 5 millimeters in thickness. Superimposed more focal 5-6 millimeter left posterior convexity scalp hematoma on series 5, image 66. Underlying calvarium intact. CT CERVICAL SPINE FINDINGS Alignment: Relatively preserved cervical lordosis. Subtle anterolisthesis of C7 on T1. Bilateral posterior element alignment is within normal limits. Skull base and vertebrae: No atlanto-occipital dissociation. No cervical spine fracture identified. Soft tissues and spinal canal: No prevertebral fluid or swelling. No visible canal hematoma. Negative noncontrast neck soft tissues aside from mild calcified carotid atherosclerosis. Disc levels: Widespread cervical disc space loss and endplate spurring. Intermittent facet hypertrophy. Only borderline to mild lower cervical spine degenerative spinal stenosis suspected such as at C5-C6. Upper chest: Visible upper thoracic levels appear intact. Negative lung apices aside from possible emphysema. Tortuous proximal great vessels. IMPRESSION: 1. Scalp hematoma with no underlying skull fracture identified. 2. Chronic small vessel disease. No acute intracranial abnormality identified. 3. No acute fracture or listhesis identified in the cervical spine. 4. 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 Electronically Signed   By: Odessa Fleming M.D.   On: 09/29/2017 13:00    Assessment/Plan Essential hypertension Controlled on Toprol XL. Also was started on Lasix .  Paroxysmal SVT Has had work up in the past. Continue on Toprol  Traumatic rhabdomyolysis, CK was trending down Repeat CK pending  Chronic kidney disease, stage III (moderate) Creat back to baseline Repeat Bmp is pending  Fall,    Patient will start therapy  Seems it was mechanical Fall  Bilateral lower extremity edema Was started on Lasix. Patient does have h/o Venous stasis  Dementia Patient has moderate dementia with recent worsening She would need assistance at home on discharge.   Repeat Labs Pending Family/ staff Communication:   Labs/tests ordered:  Total time spent in this patient care encounter was 45_ minutes; greater than 50% of the visit  spent counseling patient, reviewing records , Labs and coordinating care for problems addressed at this encounter.

## 2017-10-13 LAB — BASIC METABOLIC PANEL
BUN: 28 — AB (ref 4–21)
Creatinine: 1.2 — AB (ref 0.5–1.1)
GLUCOSE: 93
Potassium: 3.6 (ref 3.4–5.3)
Sodium: 148 — AB (ref 137–147)

## 2017-10-13 LAB — CBC AND DIFFERENTIAL
HEMATOCRIT: 38 (ref 36–46)
Hemoglobin: 12.7 (ref 12.0–16.0)
PLATELETS: 282 (ref 150–399)
WBC: 9.1

## 2017-10-14 ENCOUNTER — Encounter: Payer: Self-pay | Admitting: Internal Medicine

## 2017-10-14 ENCOUNTER — Non-Acute Institutional Stay (SKILLED_NURSING_FACILITY): Payer: Medicare Other | Admitting: Internal Medicine

## 2017-10-14 DIAGNOSIS — D649 Anemia, unspecified: Secondary | ICD-10-CM | POA: Diagnosis not present

## 2017-10-14 DIAGNOSIS — R609 Edema, unspecified: Secondary | ICD-10-CM | POA: Diagnosis not present

## 2017-10-14 DIAGNOSIS — N183 Chronic kidney disease, stage 3 unspecified: Secondary | ICD-10-CM

## 2017-10-14 DIAGNOSIS — E870 Hyperosmolality and hypernatremia: Secondary | ICD-10-CM | POA: Diagnosis not present

## 2017-10-14 NOTE — Progress Notes (Signed)
Location:   [facility] Room Number: 421/D Place of Service:  SNF [phone]) Provider:  Edmon Crape  Via, Caryn Bee, MD  Patient Care Team: ViaCaryn Bee, MD as PCP - General (Family Medicine)  Extended Emergency Contact Information Primary Emergency Contact: McNeely,Richard Address: [address]          [place], [place] [address] [place] Home Phone: [phone] Relation: Brother  Code Status:  DNR Goals of care: Advanced Directive information Advanced Directives 10/14/2017  Does Patient Have a Medical Advance Directive? Yes  Type of Advance Directive Out of facility DNR (pink MOST or yellow form)  Does patient want to make changes to medical advance directive? No - Patient declined  Pre-existing out of facility DNR order (yellow form or pink MOST form) -     Complaint this acute visit secondary to hypernatremia  HPI:  Pt is a 84 y.o. female seen today for an acute visit for follow-up of hypernatremia  Patient was admitted here recently after hospitalization after sustaining a fall at home.  Patient had been living at home with a history of SVT-hypertension-depression chronic kidney disease and restrictive lung disease as well as apparently some progressive mild dementia.  Apparently she fell out overnight at her house and her brother discovered her the next morning.  She had an elevated CK level and troponins and creatinine was elevated- CT of the head and neck were unremarkable she was slowly rehydrated- echo did show an ejection fraction of 60-65% with grade 1 diastolic dysfunction.  She responded to hydration and was discharged to skilled nursing for therapy.  Routine labs were done for follow-up which showed her potassium rising somewhat up to 148-her BUN was 28 creatinine was 1.17.  Of note she is on low-dose Lasix with a history of lower extremity edema and grade 1 diastolic CHF.  Vital signs appear to be stable she does not  really have any complaints     Past Medical History:  Diagnosis Date  . Acne rosacea   . CKD (chronic kidney disease), stage III (HCC)   . Depression   . Hypertension   . Osteopenia   . PUD (peptic ulcer disease)   . Restrictive lung disease   . Shingles   . Tinnitus    and vertigo   History reviewed. No pertinent surgical history.  No Known Allergies  Outpatient Encounter Medications as of 10/14/2017  Medication Sig  . furosemide (LASIX) 20 MG tablet Take 20 mg by mouth.  . metoprolol succinate (TOPROL-XL) 25 MG 24 hr tablet Take 25 mg by mouth daily.   . multivitamin-iron-minerals-folic acid (CENTRUM) chewable tablet Chew 1 tablet by mouth daily.  . NON FORMULARY Give 120 ml by mouth twice daily as suplement  . nystatin cream (MYCOSTATIN) Apply 1 application topically daily. For 7 days stop on 10/15/2017  . potassium chloride (K-DUR,KLOR-CON) 10 MEQ tablet Take 10 mEq by mouth daily.  . [DISCONTINUED] Multiple Vitamins-Minerals (WOMENS 50+ ADVANCED PO) Take 1 tablet by mouth daily.   No facility-administered encounter medications on file as of 10/14/2017.     Review of Systems   General she is not complaining of any fever chills.  Skin does not complain of rashes or itching.  Head ears eyes nose mouth and throat is not complaining of visual changes or sore throat.  Respiratory does not complain of shortness of breath or cough today-.  Cardiac is not complaining of chest pain as some continued right  lower extremity edema.  GI is not complaining of abdominal discomfort nausea vomiting diarrhea constipation-.  GU does not complain of dysuria.  Musculoskeletal does say she is feeling still weak but does not really complain of joint pain.  Neurologic is not complain of dizziness headache or syncope does complain of some weakness.  Psych does not complain of being anxious or depressed does have what appears to be some element of dementia and confusion at times-she is  largely conversant  Immunization History  Administered Date(s) Administered  . Influenza, High Dose Seasonal PF 03/31/2017   Pertinent  Health Maintenance Due  Topic Date Due  . DEXA SCAN  11/10/2017 (Originally 12/28/1988)  . INFLUENZA VACCINE  01/29/2018  . PNA vac Low Risk Adult  Discontinued   No flowsheet data found. Functional Status Survey:    Vitals:   10/14/17 1508  BP: 129/64  Pulse: 72  Resp: 20  Temp: (!) 97 F (36.1 C)  TempSrc: Oral  SpO2: 94%    Physical Exam  In general this is a pleasant elderly female in no distress sitting comfortably in her wheelchair.  Her skin is warm and dry.  Oropharynx clear mucous membranes moist.  Eyes visual acuity appears to be grossl intact  Oropharynx is clear mucous membranes are moist.  Chest is clear to auscultation there is no labored breathing.  Heart is regular rate and rhythm with some irregular beats-she has I would say moderate lower extremity edema bilaterally apparently this is relatively baseline.  Her abdomen is soft nontender with positive bowel sounds.  Musculoskeletal ambulatory in wheelchair strength appears to be relatively intact all 4 extremities.  Neurologic she is alert could not really appreciate lateralizing findings cranial nerves appear to be intact her speech is clear.  Psych she is oriented to self she is pleasant can carry on a short conversation and at times appears to give an accurate history however she said her brother comes to visit her when asked his age she said he was about 84 years old     Labs reviewed:   October 13, 2017.  Sodium 140 potassium 3.6 BUN 28.1 creatinine 1.17.  WBC 9.1 hemoglobin 12.7 platelets 282.   Recent Labs    10/01/17 0725 10/02/17 0627 10/03/17 0739  NA 141 141 140  K 3.0* 3.5 3.7  CL 105 108 109  CO2 23 25 23   GLUCOSE 118* 96 99  BUN 20 19 19   CREATININE 1.04* 1.14* 1.34*  CALCIUM 8.4* 8.1* 8.3*   Recent Labs    09/29/17 1127  09/29/17 2124 09/30/17 0728  AST 91* 113* 108*  ALT 30 32 36  ALKPHOS 57 51 43  BILITOT 1.1 1.0 1.1  PROT 6.0* 5.3* 5.0*  ALBUMIN 3.2* 2.7* 2.5*   Recent Labs    09/29/17 1127  10/01/17 0725 10/02/17 0627 10/03/17 0739  WBC 13.5*   < > 11.4* 10.3 10.0  NEUTROABS 11.4*  --   --   --   --   HGB 13.8   < > 14.6 13.9 14.3  HCT 42.6   < > 44.0 42.8 44.5  MCV 95.1   < > 93.6 94.3 96.5  PLT 252   < > 250 227 225   < > = values in this interval not displayed.   No results found for: TSH No results found for: HGBA1C Lab Results  Component Value Date   CHOL 167 08/15/2015   HDL 65 08/15/2015   LDLCALC 87 08/15/2015  TRIG 76 08/15/2015   CHOLHDL 2.6 08/15/2015    Significant Diagnostic Results in last 30 days:  Dg Chest 2 View  Result Date: 09/29/2017 CLINICAL DATA:  Altered mental status EXAM: CHEST - 2 VIEW COMPARISON:  None FINDINGS: Mild cardiac enlargement without heart failure. Mild bibasilar airspace disease, most likely atelectasis. Negative for effusion or mass. IMPRESSION: Mild bibasilar atelectasis. Electronically Signed   By: Marlan Palau M.D.   On: 09/29/2017 14:33   Ct Head Wo Contrast  Result Date: 09/29/2017 CLINICAL DATA:  84 year old female with recent altered mental status, progressive for the past 2 days. EXAM: CT HEAD WITHOUT CONTRAST CT CERVICAL SPINE WITHOUT CONTRAST TECHNIQUE: Multidetector CT imaging of the head and cervical spine was performed following the standard protocol without intravenous contrast. Multiplanar CT image reconstructions of the cervical spine were also generated. COMPARISON:  None. FINDINGS: CT HEAD FINDINGS Brain: No ventriculomegaly. No midline shift, mass effect, or evidence of intracranial mass lesion. Small chronic appearing lacunar infarcts in both cerebellar hemispheres, the right thalamus, left basal ganglia. Additional bilateral cerebral white matter hypodensity. No cortical encephalomalacia. No cortically based acute infarct  identified. No intracranial hemorrhage identified. Vascular: Calcified atherosclerosis at the skull base. No suspicious intracranial vascular hyperdensity. Skull: Osteopenia.  Visualized skull base is intact. Sinuses/Orbits: Small low-density fluid levels in the left sphenoid and right maxillary sinuses. Otherwise clear. Other: Broad-based scalp hematoma traversing the vertex measuring 5 millimeters in thickness. Superimposed more focal 5-6 millimeter left posterior convexity scalp hematoma on series 5, image 66. Underlying calvarium intact. CT CERVICAL SPINE FINDINGS Alignment: Relatively preserved cervical lordosis. Subtle anterolisthesis of C7 on T1. Bilateral posterior element alignment is within normal limits. Skull base and vertebrae: No atlanto-occipital dissociation. No cervical spine fracture identified. Soft tissues and spinal canal: No prevertebral fluid or swelling. No visible canal hematoma. Negative noncontrast neck soft tissues aside from mild calcified carotid atherosclerosis. Disc levels: Widespread cervical disc space loss and endplate spurring. Intermittent facet hypertrophy. Only borderline to mild lower cervical spine degenerative spinal stenosis suspected such as at C5-C6. Upper chest: Visible upper thoracic levels appear intact. Negative lung apices aside from possible emphysema. Tortuous proximal great vessels. IMPRESSION: 1. Scalp hematoma with no underlying skull fracture identified. 2. Chronic small vessel disease. No acute intracranial abnormality identified. 3. No acute fracture or listhesis identified in the cervical spine. 4. 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 Electronically Signed   By: Odessa Fleming M.D.   On: 09/29/2017 13:00   Ct Cervical Spine Wo Contrast  Result Date: 09/29/2017 CLINICAL DATA:  84 year old female with recent altered mental status, progressive for the past 2 days. EXAM: CT HEAD WITHOUT CONTRAST CT CERVICAL SPINE WITHOUT CONTRAST  TECHNIQUE: Multidetector CT imaging of the head and cervical spine was performed following the standard protocol without intravenous contrast. Multiplanar CT image reconstructions of the cervical spine were also generated. COMPARISON:  None. FINDINGS: CT HEAD FINDINGS Brain: No ventriculomegaly. No midline shift, mass effect, or evidence of intracranial mass lesion. Small chronic appearing lacunar infarcts in both cerebellar hemispheres, the right thalamus, left basal ganglia. Additional bilateral cerebral white matter hypodensity. No cortical encephalomalacia. No cortically based acute infarct identified. No intracranial hemorrhage identified. Vascular: Calcified atherosclerosis at the skull base. No suspicious intracranial vascular hyperdensity. Skull: Osteopenia.  Visualized skull base is intact. Sinuses/Orbits: Small low-density fluid levels in the left sphenoid and right maxillary sinuses. Otherwise clear. Other: Broad-based scalp hematoma traversing the vertex measuring 5 millimeters in thickness. Superimposed more  focal 5-6 millimeter left posterior convexity scalp hematoma on series 5, image 66. Underlying calvarium intact. CT CERVICAL SPINE FINDINGS Alignment: Relatively preserved cervical lordosis. Subtle anterolisthesis of C7 on T1. Bilateral posterior element alignment is within normal limits. Skull base and vertebrae: No atlanto-occipital dissociation. No cervical spine fracture identified. Soft tissues and spinal canal: No prevertebral fluid or swelling. No visible canal hematoma. Negative noncontrast neck soft tissues aside from mild calcified carotid atherosclerosis. Disc levels: Widespread cervical disc space loss and endplate spurring. Intermittent facet hypertrophy. Only borderline to mild lower cervical spine degenerative spinal stenosis suspected such as at C5-C6. Upper chest: Visible upper thoracic levels appear intact. Negative lung apices aside from possible emphysema. Tortuous proximal great  vessels. IMPRESSION: 1. Scalp hematoma with no underlying skull fracture identified. 2. Chronic small vessel disease. No acute intracranial abnormality identified. 3. No acute fracture or listhesis identified in the cervical spine. 4. 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 Electronically Signed   By: Odessa FlemingH  Hall M.D.   On: 09/29/2017 13:00    Assessment/Plan  #1-  #1 history of mild hyponatremia she may start getting a bit dry here-this was discussed with Dr. Chales AbrahamsGupta and will change her Lasix to every other day she still does have some significant edema.  This is somewhat of a balancing situation- with her history of edema with diastolic CHF grade 1.  We also will order weights daily notify provider if gain greater than 3 pounds.  Also obtain an updated BMP stat on Friday, April 19 and notify provider of results for follow-up.  2.-  Anemia it appears her hemoglobin has dropped slightly from her recent baseline at 12.7 baseline appears recent a 13-14 area will update this as well for follow-up on Friday.  3.  History of hypertension at this point appears to be controlled on Toprol-XL continue to monitor.  History of SVT this appears to be controlled as well on the Toprol currently.  5.  History of chronic kidney disease-creatinine appears to be relatively baseline at 1.17 will repeat this again the sodium is rising somewhat.  [id number]

## 2017-10-17 LAB — CBC AND DIFFERENTIAL
HCT: 40 (ref 36–46)
Hemoglobin: 13.3 (ref 12.0–16.0)
PLATELETS: 286 (ref 150–399)
WBC: 7.8

## 2017-10-17 LAB — BASIC METABOLIC PANEL
BUN: 22 — AB (ref 4–21)
CREATININE: 0.9 (ref 0.5–1.1)
GLUCOSE: 99
POTASSIUM: 3.8 (ref 3.4–5.3)
SODIUM: 147 (ref 137–147)

## 2017-11-03 ENCOUNTER — Non-Acute Institutional Stay (SKILLED_NURSING_FACILITY): Payer: Medicare Other | Admitting: Internal Medicine

## 2017-11-03 ENCOUNTER — Encounter: Payer: Self-pay | Admitting: Internal Medicine

## 2017-11-03 DIAGNOSIS — E782 Mixed hyperlipidemia: Secondary | ICD-10-CM | POA: Diagnosis not present

## 2017-11-03 DIAGNOSIS — J984 Other disorders of lung: Secondary | ICD-10-CM

## 2017-11-03 DIAGNOSIS — N183 Chronic kidney disease, stage 3 unspecified: Secondary | ICD-10-CM

## 2017-11-03 DIAGNOSIS — I129 Hypertensive chronic kidney disease with stage 1 through stage 4 chronic kidney disease, or unspecified chronic kidney disease: Secondary | ICD-10-CM | POA: Diagnosis not present

## 2017-11-03 NOTE — Progress Notes (Signed)
Location:  [facility] Room Number: 421D Place of Service:  SNF (31)  [name]. [name], MD  Via, Caryn Bee, MD  Patient Care Team: Via, Caryn Bee, MD as PCP - General [facility])  Extended Emergency Contact Information Primary Emergency Contact: McNeely,Richard Address: [address]          [place], [place] [address] [place] Home Phone: [phone] Relation: Brother    Allergies: Patient has no known allergies.  Chief Complaint  Patient presents with  . Medical Management of Chronic Issues    Routine Visit    HPI: Patient is 84 y.o. female who is being seen for routine issues of hypertension, restrictive airways disease, and hyperlipidemia.  Past Medical History:  Diagnosis Date  . Acne rosacea   . CKD (chronic kidney disease), stage III (HCC)   . Depression   . Hypertension   . Osteopenia   . PUD (peptic ulcer disease)   . Restrictive lung disease   . Shingles   . Tinnitus    and vertigo    History reviewed. No pertinent surgical history.  Allergies as of 11/03/2017   No Known Allergies     Medication List        Accurate as of 11/03/17 11:59 PM. Always use your most recent med list.          CENTRUM SILVER 50+WOMEN Tabs Take 1 tablet by mouth daily.   furosemide 20 MG tablet Commonly known as:  LASIX Take 20 mg by mouth every other day.   losartan 25 MG tablet Commonly known as:  COZAAR Take 25 mg by mouth daily.   metoprolol succinate 25 MG 24 hr tablet Commonly known as:  TOPROL-XL Take 25 mg by mouth daily.   NON FORMULARY Medpass : Give 120 ml by mouth twice daily as suplement   potassium chloride 10 MEQ tablet Commonly known as:  K-DUR,KLOR-CON Take 10 mEq by mouth daily.       No orders of the defined types were placed in this encounter.   Immunization History  Administered Date(s) Administered  . Influenza, High Dose Seasonal PF 03/31/2017    Social History   Tobacco Use    . Smoking status: Never Smoker  . Smokeless tobacco: Never Used  Substance Use Topics  . Alcohol use: No    Review of Systems  DATA OBTAINED: from patient, nurse GENERAL:  no fevers, fatigue, appetite changes SKIN: No itching, rash HEENT: No complaint RESPIRATORY: No cough, wheezing, SOB CARDIAC: No chest pain, palpitations, lower extremity edema  GI: No abdominal pain, No N/V/D or constipation, No heartburn or reflux  GU: No dysuria, frequency or urgency, or incontinence  MUSCULOSKELETAL: No unrelieved bone/joint pain NEUROLOGIC: No headache, dizziness  PSYCHIATRIC: No overt anxiety or sadness  Vitals:   11/03/17 1028  BP: 140/65  Pulse: 69  Resp: 16  Temp: 98 F (36.7 C)  SpO2: 96%   Body mass index is 21.66 kg/m. Physical Exam  GENERAL APPEARANCE: Alert, conversant, No acute distress  SKIN: No diaphoresis rash HEENT: Unremarkable RESPIRATORY: Breathing is even, unlabored. Lung sounds are clear   CARDIOVASCULAR: Heart RRR no murmurs, rubs or gallops. No peripheral edema  GASTROINTESTINAL: Abdomen is soft, non-tender, not distended w/ normal bowel sounds.  GENITOURINARY: Bladder non tender, not distended  MUSCULOSKELETAL: No abnormal joints or musculature NEUROLOGIC: Cranial nerves 2-12 grossly intact. Moves all extremities PSYCHIATRIC: Mood and affect some dementia, intermittent confusion, no behavioral issues  Patient  Active Problem List   Diagnosis Date Noted  . Benign hypertension with chronic kidney disease, stage III (HCC) 10/06/2017  . Chronic kidney disease, stage III (moderate) (HCC) 10/06/2017  . Restrictive airway disease 10/06/2017  . Malnutrition of moderate degree 10/01/2017  . Fall 09/29/2017  . Rhabdomyolysis 09/29/2017  . Acute renal failure (ARF) (HCC) 09/29/2017  . Elevated troponin 09/29/2017  . Bilateral lower extremity edema 08/15/2015  . Paroxysmal supraventricular tachycardia (HCC) 12/06/2013  . Essential hypertension, benign  12/06/2013  . Syncope and collapse 12/06/2013    CMP     Component Value Date/Time   NA 147 10/17/2017   K 3.8 10/17/2017   CL 109 10/03/2017 0739   CO2 23 10/03/2017 0739   GLUCOSE 99 10/03/2017 0739   BUN 22 (A) 10/17/2017   CREATININE 0.9 10/17/2017   CREATININE 1.34 (H) 10/03/2017 0739   CREATININE 1.07 (H) 08/15/2015 1422   CALCIUM 8.3 (L) 10/03/2017 0739   PROT 5.0 (L) 09/30/2017 0728   ALBUMIN 2.5 (L) 09/30/2017 0728   AST 108 (H) 09/30/2017 0728   ALT 36 09/30/2017 0728   ALKPHOS 43 09/30/2017 0728   BILITOT 1.1 09/30/2017 0728   GFRNONAA 33 (L) 10/03/2017 0739   GFRAA 38 (L) 10/03/2017 0739   Recent Labs    10/01/17 0725 10/02/17 0627 10/03/17 0739 10/13/17 10/17/17  NA 141 141 140 148* 147  K 3.0* 3.5 3.7 3.6 3.8  CL 105 108 109  --   --   CO2 --   --   GLUCOSE 118* 96 99  --   --   BUN 28* 22*  CREATININE 1.04* 1.14* 1.34* 1.2* 0.9  CALCIUM 8.4* 8.1* 8.3*  --   --    Recent Labs    09/29/17 1127 09/29/17 2124 09/30/17 0728  AST 91* 113* 108*  ALT 30 32 36  ALKPHOS 57 51 43  BILITOT 1.1 1.0 1.1  PROT 6.0* 5.3* 5.0*  ALBUMIN 3.2* 2.7* 2.5*   Recent Labs    09/29/17 1127  10/01/17 0725 10/02/17 0627 10/03/17 0739 10/13/17 10/17/17  WBC 13.5*   < > 11.4* 10.3 10.0 9.1 7.8  NEUTROABS 11.4*  --   --   --   --   --   --   HGB 13.8   < > 14.6 13.9 14.3 12.7 13.3  HCT 42.6   < > 44.0 42.8 44.5 38 40  MCV 95.1   < > 93.6 94.3 96.5  --   --   PLT 252   < > 250 227 225 282 286   < > = values in this interval not displayed.   No results for input(s): CHOL, LDLCALC, TRIG in the last 8760 hours.  Invalid input(s): HCL No results found for: MICROALBUR No results found for: TSH No results found for: HGBA1C Lab Results  Component Value Date   CHOL 167 08/15/2015   HDL 65 08/15/2015   LDLCALC 87 08/15/2015   TRIG 76 08/15/2015   CHOLHDL 2.6 08/15/2015    Significant Diagnostic Results in last 30 days:  No results  found.  Assessment and Plan  Benign hypertension with chronic kidney disease, stage III (HCC) Controlled; continue Cozaar 25 mg daily, Toprol-XL 25 mg daily and Lasix 20 mg every other day  Restrictive airway disease Stable at this time; patient is on no medications but can order PRN albuterol as needed  Hyperlipidemia Patient no longer on lipid-lowering medication secondary to age  [name]. Sheppard Coil, MD

## 2017-11-06 DIAGNOSIS — R488 Other symbolic dysfunctions: Secondary | ICD-10-CM | POA: Diagnosis not present

## 2017-11-23 ENCOUNTER — Encounter: Payer: Self-pay | Admitting: Internal Medicine

## 2017-11-23 NOTE — Assessment & Plan Note (Signed)
Controlled; continue Cozaar 25 mg daily, Toprol-XL 25 mg daily and Lasix 20 mg every other day

## 2017-11-23 NOTE — Assessment & Plan Note (Signed)
Patient no longer on lipid-lowering medication secondary to age

## 2017-11-23 NOTE — Assessment & Plan Note (Signed)
Stable at this time; patient is on no medications but can order PRN albuterol as needed

## 2017-12-03 ENCOUNTER — Non-Acute Institutional Stay (SKILLED_NURSING_FACILITY): Payer: Medicare Other | Admitting: Internal Medicine

## 2017-12-03 ENCOUNTER — Encounter: Payer: Self-pay | Admitting: Internal Medicine

## 2017-12-03 DIAGNOSIS — N183 Chronic kidney disease, stage 3 unspecified: Secondary | ICD-10-CM

## 2017-12-03 DIAGNOSIS — I10 Essential (primary) hypertension: Secondary | ICD-10-CM | POA: Diagnosis not present

## 2017-12-03 DIAGNOSIS — R600 Localized edema: Secondary | ICD-10-CM | POA: Diagnosis not present

## 2017-12-03 NOTE — Progress Notes (Signed)
Location:  [facility] Room Number: 205-D Place of Service:  SNF (31)  Margit Hanks, MD  Patient Care Team: Via, Caryn Bee, MD as PCP - General [facility] Medicine)  Extended Emergency Contact Information Primary Emergency Contact: McNeely,Richard Address: [address]          [place], [place] [address] [place] Home Phone: [phone] Relation: Brother    Allergies: Patient has no known allergies.  Chief Complaint  Patient presents with  . Medical Management of Chronic Issues    Routine Visit    HPI: Patient is 84 y.o. female who is being seen for routine issues of hypertension, chronic kidney disease stage III, and bilateral lower extremity edema.  Past Medical History:  Diagnosis Date  . Acne rosacea   . CKD (chronic kidney disease), stage III (HCC)   . Depression   . Hypertension   . Osteopenia   . PUD (peptic ulcer disease)   . Restrictive lung disease   . Shingles   . Tinnitus    and vertigo    History reviewed. No pertinent surgical history.  Allergies as of 12/03/2017   No Known Allergies     Medication List        Accurate as of 12/03/17 11:59 PM. Always use your most recent med list.          CENTRUM SILVER 50+WOMEN Tabs Take 1 tablet by mouth daily.   furosemide 20 MG tablet Commonly known as:  LASIX Take 20 mg by mouth every other day.   losartan 25 MG tablet Commonly known as:  COZAAR Take 25 mg by mouth daily.   metoprolol succinate 25 MG 24 hr tablet Commonly known as:  TOPROL-XL Take 25 mg by mouth daily.   NON FORMULARY Medpass : Give 120 ml by mouth twice daily as suplement   potassium chloride 10 MEQ tablet Commonly known as:  K-DUR,KLOR-CON Take 10 mEq by mouth daily.       No orders of the defined types were placed in this encounter.   Immunization History  Administered Date(s) Administered  . Influenza, High Dose Seasonal PF 03/31/2017    Social History    Tobacco Use  . Smoking status: Never Smoker  . Smokeless tobacco: Never Used  Substance Use Topics  . Alcohol use: No    Review of Systems  DATA OBTAINED: from patient, nurse GENERAL:  no fevers, fatigue, appetite changes SKIN: No itching, rash HEENT: No complaint RESPIRATORY: No cough, wheezing, SOB CARDIAC: No chest pain, palpitations, lower extremity edema  GI: No abdominal pain, No N/V/D or constipation, No heartburn or reflux  GU: No dysuria, frequency or urgency, or incontinence  MUSCULOSKELETAL: No unrelieved bone/joint pain NEUROLOGIC: No headache, dizziness  PSYCHIATRIC: No overt anxiety or sadness  Vitals:   12/03/17 1107  BP: 110/67  Pulse: 78  Resp: 18  Temp: 97.9 F (36.6 C)   Body mass index is 20.25 kg/m. Physical Exam  GENERAL APPEARANCE: Alert, conversant, No acute distress  SKIN: No diaphoresis rash HEENT: Unremarkable RESPIRATORY: Breathing is even, unlabored. Lung sounds are clear   CARDIOVASCULAR: Heart RRR no murmurs, rubs or gallops. No peripheral edema  GASTROINTESTINAL: Abdomen is soft, non-tender, not distended w/ normal bowel sounds.  GENITOURINARY: Bladder non tender, not distended  MUSCULOSKELETAL: No abnormal joints or musculature NEUROLOGIC: Cranial nerves 2-12 grossly intact. Moves all extremities PSYCHIATRIC: Mood and affect with some dementia, no behavioral issues  Patient Active Problem  List   Diagnosis Date Noted  . Benign hypertension with chronic kidney disease, stage III (HCC) 10/06/2017  . Chronic kidney disease, stage III (moderate) (HCC) 10/06/2017  . Restrictive airway disease 10/06/2017  . Malnutrition of moderate degree 10/01/2017  . Fall 09/29/2017  . Rhabdomyolysis 09/29/2017  . Acute renal failure (ARF) (HCC) 09/29/2017  . Elevated troponin 09/29/2017  . Bilateral lower extremity edema 08/15/2015  . Paroxysmal supraventricular tachycardia (HCC) 12/06/2013  . Essential hypertension, benign 12/06/2013  .  Syncope and collapse 12/06/2013    CMP     Component Value Date/Time   NA 147 10/17/2017   K 3.8 10/17/2017   CL 109 10/03/2017 0739   CO2 23 10/03/2017 0739   GLUCOSE 99 10/03/2017 0739   BUN 22 (A) 10/17/2017   CREATININE 0.9 10/17/2017   CREATININE 1.34 (H) 10/03/2017 0739   CREATININE 1.07 (H) 08/15/2015 1422   CALCIUM 8.3 (L) 10/03/2017 0739   PROT 5.0 (L) 09/30/2017 0728   ALBUMIN 2.5 (L) 09/30/2017 0728   AST 108 (H) 09/30/2017 0728   ALT 36 09/30/2017 0728   ALKPHOS 43 09/30/2017 0728   BILITOT 1.1 09/30/2017 0728   GFRNONAA 33 (L) 10/03/2017 0739   GFRAA 38 (L) 10/03/2017 0739   Recent Labs    10/01/17 0725 10/02/17 0627 10/03/17 0739 10/13/17 10/17/17  NA 141 141 140 148* 147  K 3.0* 3.5 3.7 3.6 3.8  CL 105 108 109  --   --   CO2 23 25 23   --   --   GLUCOSE 118* 96 99  --   --   BUN 20 19 19  28* 22*  CREATININE 1.04* 1.14* 1.34* 1.2* 0.9  CALCIUM 8.4* 8.1* 8.3*  --   --    Recent Labs    09/29/17 1127 09/29/17 2124 09/30/17 0728  AST 91* 113* 108*  ALT 30 32 36  ALKPHOS 57 51 43  BILITOT 1.1 1.0 1.1  PROT 6.0* 5.3* 5.0*  ALBUMIN 3.2* 2.7* 2.5*   Recent Labs    09/29/17 1127  10/01/17 0725 10/02/17 0627 10/03/17 0739 10/13/17 10/17/17  WBC 13.5*   < > 11.4* 10.3 10.0 9.1 7.8  NEUTROABS 11.4*  --   --   --   --   --   --   HGB 13.8   < > 14.6 13.9 14.3 12.7 13.3  HCT 42.6   < > 44.0 42.8 44.5 38 40  MCV 95.1   < > 93.6 94.3 96.5  --   --   PLT 252   < > 250 227 225 282 286   < > = values in this interval not displayed.   No results for input(s): CHOL, LDLCALC, TRIG in the last 8760 hours.  Invalid input(s): HCL No results found for: MICROALBUR No results found for: TSH No results found for: HGBA1C Lab Results  Component Value Date   CHOL 167 08/15/2015   HDL 65 08/15/2015   LDLCALC 87 08/15/2015   TRIG 76 08/15/2015   CHOLHDL 2.6 08/15/2015    Significant Diagnostic Results in last 30 days:  No results found.  Assessment and  Plan  Essential hypertension, benign Controlled; continue Cozaar 25 mg daily, Toprol-XL 25 mg daily, and Lasix 20 mg every other day  Chronic kidney disease, stage III (moderate) (HCC) Creatinine 0.9 which is improved from prior; will monitor intervals  Bilateral lower extremity edema Stable; continue Lasix 20 mg every other day     Margit HanksAnne D Paeton Studer, MD

## 2017-12-28 ENCOUNTER — Encounter: Payer: Self-pay | Admitting: Internal Medicine

## 2017-12-28 NOTE — Assessment & Plan Note (Signed)
Creatinine 0.9 which is improved from prior; will monitor intervals

## 2017-12-28 NOTE — Assessment & Plan Note (Signed)
Controlled; continue Cozaar 25 mg daily, Toprol-XL 25 mg daily, and Lasix 20 mg every other day

## 2017-12-28 NOTE — Assessment & Plan Note (Signed)
Stable; continue Lasix 20 mg every other day

## 2017-12-30 ENCOUNTER — Encounter: Payer: Self-pay | Admitting: Internal Medicine

## 2017-12-30 ENCOUNTER — Non-Acute Institutional Stay (SKILLED_NURSING_FACILITY): Payer: Medicare Other | Admitting: Internal Medicine

## 2017-12-30 DIAGNOSIS — N183 Chronic kidney disease, stage 3 unspecified: Secondary | ICD-10-CM

## 2017-12-30 DIAGNOSIS — I471 Supraventricular tachycardia: Secondary | ICD-10-CM

## 2017-12-30 DIAGNOSIS — J984 Other disorders of lung: Secondary | ICD-10-CM

## 2017-12-30 DIAGNOSIS — I129 Hypertensive chronic kidney disease with stage 1 through stage 4 chronic kidney disease, or unspecified chronic kidney disease: Secondary | ICD-10-CM | POA: Diagnosis not present

## 2017-12-30 NOTE — Progress Notes (Signed)
Location:  [facility] Room Number: 205-D Place of Service:  SNF (31)  Madeline Parrish. [name], MD  Patient Care Team: Via, Caryn Bee, MD as PCP - General [facility] Medicine)  Extended Emergency Contact Information Primary Emergency Contact: McNeely,Richard Address: [address]          [place], [place] [address] [place] Home Phone: [phone] Relation: Brother    Allergies: Patient has no known allergies.  Chief Complaint  Patient presents with  . Medical Management of Chronic Issues    Routine Visit    HPI: Patient is 84 y.o. female who is being seen for routine issues of restrictive lung disease, hypertension, and paroxysmal SVT.  Past Medical History:  Diagnosis Date  . Acne rosacea   . CKD (chronic kidney disease), stage III (HCC)   . Depression   . Hypertension   . Osteopenia   . PUD (peptic ulcer disease)   . Restrictive lung disease   . Shingles   . Tinnitus    and vertigo    History reviewed. No pertinent surgical history.  Allergies as of 12/30/2017   No Known Allergies     Medication List        Accurate as of 12/30/17 11:59 PM. Always use your most recent med list.          CENTRUM SILVER 50+WOMEN Tabs Take 1 tablet by mouth daily.   furosemide 20 MG tablet Commonly known as:  LASIX Take 20 mg by mouth every other day.   losartan 25 MG tablet Commonly known as:  COZAAR Take 25 mg by mouth daily.   metoprolol succinate 25 MG 24 hr tablet Commonly known as:  TOPROL-XL Take 25 mg by mouth daily.   NON FORMULARY Medpass : Give 120 ml by mouth twice daily as suplement   potassium chloride 10 MEQ tablet Commonly known as:  K-DUR,KLOR-CON Take 10 mEq by mouth daily.       No orders of the defined types were placed in this encounter.   Immunization History  Administered Date(s) Administered  . Influenza, High Dose Seasonal PF 03/31/2017    Social History   Tobacco Use  . Smoking  status: Never Smoker  . Smokeless tobacco: Never Used  Substance Use Topics  . Alcohol use: No    Review of Systems  DATA OBTAINED: from patient, nurse GENERAL:  no fevers, fatigue, appetite changes SKIN: No itching, rash HEENT: No complaint RESPIRATORY: No cough, wheezing, SOB CARDIAC: No chest pain, palpitations, lower extremity edema  GI: No abdominal pain, No N/V/D or constipation, No heartburn or reflux  GU: No dysuria, frequency or urgency, or incontinence  MUSCULOSKELETAL: No unrelieved bone/joint pain NEUROLOGIC: No headache, dizziness  PSYCHIATRIC: No overt anxiety or sadness  Vitals:   12/30/17 1136  BP: (!) 136/53  Pulse: 99  Resp: 16  Temp: (!) 97.2 F (36.2 C)   Body mass index is 20.15 kg/m. Physical Exam  GENERAL APPEARANCE: Alert, conversant, No acute distress  SKIN: No diaphoresis rash HEENT: Unremarkable RESPIRATORY: Breathing is even, unlabored. Lung sounds are clear   CARDIOVASCULAR: Heart RRR no murmurs, rubs or gallops. No peripheral edema  GASTROINTESTINAL: Abdomen is soft, non-tender, not distended w/ normal bowel sounds.  GENITOURINARY: Bladder non tender, not distended  MUSCULOSKELETAL: No abnormal joints or musculature NEUROLOGIC: Cranial nerves 2-12 grossly intact. Moves all extremities PSYCHIATRIC: Mood and affect some dementia, no behavioral issues  Patient Active Problem List   Diagnosis  Date Noted  . Benign hypertension with chronic kidney disease, stage III (HCC) 10/06/2017  . Chronic kidney disease, stage III (moderate) (HCC) 10/06/2017  . Restrictive airway disease 10/06/2017  . Malnutrition of moderate degree 10/01/2017  . Fall 09/29/2017  . Rhabdomyolysis 09/29/2017  . Acute renal failure (ARF) (HCC) 09/29/2017  . Elevated troponin 09/29/2017  . Bilateral lower extremity edema 08/15/2015  . Paroxysmal supraventricular tachycardia (HCC) 12/06/2013  . Essential hypertension, benign 12/06/2013  . Syncope and collapse 12/06/2013     CMP     Component Value Date/Time   NA 147 10/17/2017   K 3.8 10/17/2017   CL 109 10/03/2017 0739   CO2 23 10/03/2017 0739   GLUCOSE 99 10/03/2017 0739   BUN 22 (A) 10/17/2017   CREATININE 0.9 10/17/2017   CREATININE 1.34 (H) 10/03/2017 0739   CREATININE 1.07 (H) 08/15/2015 1422   CALCIUM 8.3 (L) 10/03/2017 0739   PROT 5.0 (L) 09/30/2017 0728   ALBUMIN 2.5 (L) 09/30/2017 0728   AST 108 (H) 09/30/2017 0728   ALT 36 09/30/2017 0728   ALKPHOS 43 09/30/2017 0728   BILITOT 1.1 09/30/2017 0728   GFRNONAA 33 (L) 10/03/2017 0739   GFRAA 38 (L) 10/03/2017 0739   Recent Labs    10/01/17 0725 10/02/17 0627 10/03/17 0739 10/13/17 10/17/17  NA 141 141 140 148* 147  K 3.0* 3.5 3.7 3.6 3.8  CL 105 108 109  --   --   CO2 23 25 23   --   --   GLUCOSE 118* 96 99  --   --   BUN 20 19 19  28* 22*  CREATININE 1.04* 1.14* 1.34* 1.2* 0.9  CALCIUM 8.4* 8.1* 8.3*  --   --    Recent Labs    09/29/17 1127 09/29/17 2124 09/30/17 0728  AST 91* 113* 108*  ALT 30 32 36  ALKPHOS 57 51 43  BILITOT 1.1 1.0 1.1  PROT 6.0* 5.3* 5.0*  ALBUMIN 3.2* 2.7* 2.5*   Recent Labs    09/29/17 1127  10/01/17 0725 10/02/17 0627 10/03/17 0739 10/13/17 10/17/17  WBC 13.5*   < > 11.4* 10.3 10.0 9.1 7.8  NEUTROABS 11.4*  --   --   --   --   --   --   HGB 13.8   < > 14.6 13.9 14.3 12.7 13.3  HCT 42.6   < > 44.0 42.8 44.5 38 40  MCV 95.1   < > 93.6 94.3 96.5  --   --   PLT 252   < > 250 227 225 282 286   < > = values in this interval not displayed.   No results for input(s): CHOL, LDLCALC, TRIG in the last 8760 hours.  Invalid input(s): HCL No results found for: MICROALBUR No results found for: TSH No results found for: HGBA1C Lab Results  Component Value Date   CHOL 167 08/15/2015   HDL 65 08/15/2015   LDLCALC 87 08/15/2015   TRIG 76 08/15/2015   CHOLHDL 2.6 08/15/2015    Significant Diagnostic Results in last 30 days:  No results found.  Assessment and Plan  Restrictive airway  disease Stable with no reported use of PRN albuterol; continue to monitor  Benign hypertension with chronic kidney disease, stage III (HCC) Controlled; continue Cozaar 25 mg daily Toprol-XL 25 mg daily and Lasix every other day  Paroxysmal supraventricular tachycardia No known episodes; continue Toprol-XL 25 mg p.o. daily     Madeline Vanvranken D. Lyn HollingsheadAlexander, MD

## 2018-01-13 ENCOUNTER — Encounter: Payer: Self-pay | Admitting: Internal Medicine

## 2018-01-13 NOTE — Assessment & Plan Note (Signed)
Controlled; continue Cozaar 25 mg daily Toprol-XL 25 mg daily and Lasix every other day

## 2018-01-13 NOTE — Assessment & Plan Note (Signed)
Stable with no reported use of PRN albuterol; continue to monitor

## 2018-01-13 NOTE — Assessment & Plan Note (Signed)
No known episodes; continue Toprol-XL 25 mg p.o. daily

## 2018-01-29 ENCOUNTER — Other Ambulatory Visit: Payer: Self-pay

## 2018-01-29 ENCOUNTER — Emergency Department (HOSPITAL_COMMUNITY): Payer: Medicare Other

## 2018-01-29 ENCOUNTER — Emergency Department (HOSPITAL_COMMUNITY)
Admission: EM | Admit: 2018-01-29 | Discharge: 2018-01-29 | Disposition: A | Discharge status: Home / Self Care | Payer: Medicare Other | Source: Home / Self Care | Attending: Emergency Medicine | Admitting: Emergency Medicine

## 2018-01-29 DIAGNOSIS — Z7401 Bed confinement status: Secondary | ICD-10-CM | POA: Diagnosis not present

## 2018-01-29 DIAGNOSIS — I5033 Acute on chronic diastolic (congestive) heart failure: Secondary | ICD-10-CM | POA: Diagnosis not present

## 2018-01-29 DIAGNOSIS — N3000 Acute cystitis without hematuria: Secondary | ICD-10-CM | POA: Insufficient documentation

## 2018-01-29 DIAGNOSIS — A419 Sepsis, unspecified organism: Secondary | ICD-10-CM | POA: Diagnosis not present

## 2018-01-29 DIAGNOSIS — I129 Hypertensive chronic kidney disease with stage 1 through stage 4 chronic kidney disease, or unspecified chronic kidney disease: Secondary | ICD-10-CM

## 2018-01-29 DIAGNOSIS — I471 Supraventricular tachycardia: Secondary | ICD-10-CM | POA: Diagnosis not present

## 2018-01-29 DIAGNOSIS — Z79899 Other long term (current) drug therapy: Secondary | ICD-10-CM

## 2018-01-29 DIAGNOSIS — R6521 Severe sepsis with septic shock: Secondary | ICD-10-CM | POA: Diagnosis not present

## 2018-01-29 DIAGNOSIS — Y999 Unspecified external cause status: Secondary | ICD-10-CM | POA: Insufficient documentation

## 2018-01-29 DIAGNOSIS — S8012XA Contusion of left lower leg, initial encounter: Secondary | ICD-10-CM

## 2018-01-29 DIAGNOSIS — S4991XA Unspecified injury of right shoulder and upper arm, initial encounter: Secondary | ICD-10-CM | POA: Diagnosis not present

## 2018-01-29 DIAGNOSIS — I10 Essential (primary) hypertension: Secondary | ICD-10-CM | POA: Diagnosis not present

## 2018-01-29 DIAGNOSIS — J189 Pneumonia, unspecified organism: Secondary | ICD-10-CM | POA: Diagnosis not present

## 2018-01-29 DIAGNOSIS — R0902 Hypoxemia: Secondary | ICD-10-CM | POA: Diagnosis not present

## 2018-01-29 DIAGNOSIS — M2550 Pain in unspecified joint: Secondary | ICD-10-CM | POA: Diagnosis not present

## 2018-01-29 DIAGNOSIS — I130 Hypertensive heart and chronic kidney disease with heart failure and stage 1 through stage 4 chronic kidney disease, or unspecified chronic kidney disease: Secondary | ICD-10-CM | POA: Diagnosis not present

## 2018-01-29 DIAGNOSIS — N183 Chronic kidney disease, stage 3 (moderate): Secondary | ICD-10-CM

## 2018-01-29 DIAGNOSIS — T148XXA Other injury of unspecified body region, initial encounter: Secondary | ICD-10-CM

## 2018-01-29 DIAGNOSIS — Y939 Activity, unspecified: Secondary | ICD-10-CM | POA: Insufficient documentation

## 2018-01-29 DIAGNOSIS — F0390 Unspecified dementia without behavioral disturbance: Secondary | ICD-10-CM | POA: Insufficient documentation

## 2018-01-29 DIAGNOSIS — W19XXXA Unspecified fall, initial encounter: Secondary | ICD-10-CM | POA: Insufficient documentation

## 2018-01-29 DIAGNOSIS — M791 Myalgia, unspecified site: Secondary | ICD-10-CM | POA: Diagnosis not present

## 2018-01-29 DIAGNOSIS — S81812A Laceration without foreign body, left lower leg, initial encounter: Secondary | ICD-10-CM | POA: Diagnosis not present

## 2018-01-29 DIAGNOSIS — I499 Cardiac arrhythmia, unspecified: Secondary | ICD-10-CM | POA: Diagnosis not present

## 2018-01-29 DIAGNOSIS — Y92129 Unspecified place in nursing home as the place of occurrence of the external cause: Secondary | ICD-10-CM | POA: Insufficient documentation

## 2018-01-29 DIAGNOSIS — S299XXA Unspecified injury of thorax, initial encounter: Secondary | ICD-10-CM | POA: Diagnosis not present

## 2018-01-29 DIAGNOSIS — I959 Hypotension, unspecified: Secondary | ICD-10-CM | POA: Diagnosis not present

## 2018-01-29 DIAGNOSIS — J810 Acute pulmonary edema: Secondary | ICD-10-CM | POA: Diagnosis not present

## 2018-01-29 DIAGNOSIS — N170 Acute kidney failure with tubular necrosis: Secondary | ICD-10-CM | POA: Diagnosis not present

## 2018-01-29 LAB — CBC WITH DIFFERENTIAL/PLATELET
ABS IMMATURE GRANULOCYTES: 0 10*3/uL (ref 0.0–0.1)
BASOS ABS: 0.1 10*3/uL (ref 0.0–0.1)
BASOS PCT: 1 %
Eosinophils Absolute: 0.1 10*3/uL (ref 0.0–0.7)
Eosinophils Relative: 1 %
HCT: 41.3 % (ref 36.0–46.0)
HEMOGLOBIN: 13.5 g/dL (ref 12.0–15.0)
Immature Granulocytes: 0 %
LYMPHS PCT: 9 %
Lymphs Abs: 0.9 10*3/uL (ref 0.7–4.0)
MCH: 31.3 pg (ref 26.0–34.0)
MCHC: 32.7 g/dL (ref 30.0–36.0)
MCV: 95.6 fL (ref 78.0–100.0)
Monocytes Absolute: 0.6 10*3/uL (ref 0.1–1.0)
Monocytes Relative: 6 %
NEUTROS ABS: 8.6 10*3/uL — AB (ref 1.7–7.7)
NEUTROS PCT: 83 %
PLATELETS: 242 10*3/uL (ref 150–400)
RBC: 4.32 MIL/uL (ref 3.87–5.11)
RDW: 14 % (ref 11.5–15.5)
WBC: 10.3 10*3/uL (ref 4.0–10.5)

## 2018-01-29 LAB — COMPREHENSIVE METABOLIC PANEL
ALBUMIN: 3 g/dL — AB (ref 3.5–5.0)
ALK PHOS: 51 U/L (ref 38–126)
ALT: 12 U/L (ref 0–44)
AST: 17 U/L (ref 15–41)
Anion gap: 8 (ref 5–15)
BUN: 17 mg/dL (ref 8–23)
CALCIUM: 8.7 mg/dL — AB (ref 8.9–10.3)
CHLORIDE: 108 mmol/L (ref 98–111)
CO2: 27 mmol/L (ref 22–32)
Creatinine, Ser: 1.14 mg/dL — ABNORMAL HIGH (ref 0.44–1.00)
GFR calc Af Amer: 46 mL/min — ABNORMAL LOW (ref >60)
GFR calc non Af Amer: 40 mL/min — ABNORMAL LOW (ref >60)
GLUCOSE: 107 mg/dL — AB (ref 70–99)
Potassium: 3.5 mmol/L (ref 3.5–5.1)
SODIUM: 143 mmol/L (ref 135–145)
Total Bilirubin: 0.8 mg/dL (ref 0.3–1.2)
Total Protein: 5.6 g/dL — ABNORMAL LOW (ref 6.5–8.1)

## 2018-01-29 LAB — URINALYSIS, ROUTINE W REFLEX MICROSCOPIC
Bilirubin Urine: NEGATIVE
GLUCOSE, UA: NEGATIVE mg/dL
Ketones, ur: NEGATIVE mg/dL
Leukocytes, UA: NEGATIVE
NITRITE: POSITIVE — AB
PH: 7 (ref 5.0–8.0)
Protein, ur: NEGATIVE mg/dL
SPECIFIC GRAVITY, URINE: 1.01 (ref 1.005–1.030)

## 2018-01-29 LAB — I-STAT CG4 LACTIC ACID, ED: LACTIC ACID, VENOUS: 1.43 mmol/L (ref 0.5–1.9)

## 2018-01-29 LAB — TROPONIN I: Troponin I: <0.03 ng/mL (ref <0.03)

## 2018-01-29 MED ORDER — CEPHALEXIN 250 MG PO CAPS: 250.0000 mg | ORAL_CAPSULE | Freq: Two times a day (BID) | ORAL | 0 refills | Status: DC

## 2018-01-29 MED ORDER — LORAZEPAM 2 MG/ML IJ SOLN
0.5000 mg | Freq: Once | INTRAMUSCULAR | Status: AC
  Administered 2018-01-29: 0.5 mg via INTRAMUSCULAR
  Filled 2018-01-29: qty 1

## 2018-01-29 NOTE — ED Notes (Signed)
X Ray in room at this time.

## 2018-01-29 NOTE — ED Triage Notes (Signed)
[facility] had an unwitnessed fall. When found patient in "puddle of blood" with lower left calf "spurting" blood. Approx. 100ml blood loss per EMS.

## 2018-01-29 NOTE — Discharge Instructions (Signed)
Thank you for allowing me to care for you today in the Emergency Department.   Apply ice to the wound on the left lower leg.  Keep a dressing on the wound until it heals to avoid infection.  You do not have any broken bones.  Tylenol can be given for pain control.  Your urine was concerning for infection.  Take 1 tablet of Keflex 2 times daily for the next 5 days.  Return to the emergency department for new or worsening symptoms including if the area around the wound gets red, hot to the touch, swollen or if you develop fever or chills.

## 2018-01-29 NOTE — ED Notes (Signed)
Patient refuses to allow lead placement for EKG, blood draw and requests to stay in bed. Patient is AAOx1 at this time and this is reported to be her baseline. Sitter at bedside for safety reasons.

## 2018-01-29 NOTE — ED Notes (Signed)
Got patient on the monitor did patient vitals patient is resting

## 2018-01-29 NOTE — ED Provider Notes (Signed)
[facility] EMERGENCY DEPARTMENT Provider Note   CSN: [id number] Arrival date & time: 01/29/18  4098     History   Chief Complaint Chief Complaint  Patient presents with  . Fall  . Laceration    HPI Madeline Parrish is a 84 y.o. female with a history of hypertensive kidney disease, rhabdomyolysis, and dementia who presents to the emergency department from [facility] living by EMS with a chief complaint of unwitnessed fall.  EMS reports the patient was found in a puddle of blood near her left calf and blood was noted to be spurting.  EMS notes approximately 100 mL's blood loss.  BP 210/116.  Heart rate in the 80s to 90s.  CBG 100.  Spoke with Monet (sp), charge nurse at [facility] who reports that staff found the patient sitting at the foot of her bed in the floor after she was heard screaming in pain and holding her left leg.  There was a small pool of blood below the left leg.  Staff reports that she was not laying in the floor.  She did not appear to have hit her head.  Her mental status was at her baseline.  Staff reports that at her baseline that she is able to answer simple questions about her health and what is hurting her, but is unable to answer more complex questions.  Level 5 caveat secondary to dementia.  The history is provided by the patient. No language interpreter was used.    Past Medical History:  Diagnosis Date  . Acne rosacea   . CKD (chronic kidney disease), stage III (HCC)   . Depression   . Hypertension   . Osteopenia   . PUD (peptic ulcer disease)   . Restrictive lung disease   . Shingles   . Tinnitus    and vertigo    Patient Active Problem List   Diagnosis Date Noted  . Benign hypertension with chronic kidney disease, stage III (HCC) 10/06/2017  . Chronic kidney disease, stage III (moderate) (HCC) 10/06/2017  . Restrictive airway disease 10/06/2017  . Malnutrition of moderate degree 10/01/2017  . Fall 09/29/2017  .  Rhabdomyolysis 09/29/2017  . Acute renal failure (ARF) (HCC) 09/29/2017  . Elevated troponin 09/29/2017  . Bilateral lower extremity edema 08/15/2015  . Paroxysmal supraventricular tachycardia (HCC) 12/06/2013  . Essential hypertension, benign 12/06/2013  . Syncope and collapse 12/06/2013    No past surgical history on file.   OB History   None      Home Medications    Prior to Admission medications   Medication Sig Start Date End Date Taking? Authorizing Provider  furosemide (LASIX) 20 MG tablet Take 20 mg by mouth every other day.    Yes [provider]  losartan (COZAAR) 25 MG tablet Take 25 mg by mouth daily.   Yes [provider]  metoprolol succinate (TOPROL-XL) 25 MG 24 hr tablet Take 25 mg by mouth daily.  09/26/17  Yes [provider]  Multiple Vitamins-Minerals (CENTRUM SILVER 50+WOMEN) TABS Take 1 tablet by mouth daily.   Yes [provider]  NON FORMULARY Medpass : Give 120 ml by mouth twice daily as suplement   Yes [provider]  potassium chloride (K-DUR,KLOR-CON) 10 MEQ tablet Take 10 mEq by mouth every other day.    Yes [provider]  cephALEXin (KEFLEX) 250 MG capsule Take 1 capsule (250 mg total) by mouth 2 (two) times daily for 5 days. 01/29/18 02/03/18  ,  A, PA-C    Family History Family History  Problem Relation Age of Onset  . CVA Mother     Social History Social History   Tobacco Use  . Smoking status: Never Smoker  . Smokeless tobacco: Never Used  Substance Use Topics  . Alcohol use: No  . Drug use: No     Allergies   Patient has no known allergies.   Review of Systems Review of Systems  Unable to perform ROS: Dementia  Musculoskeletal: Positive for arthralgias and myalgias. Negative for joint swelling.  Skin: Positive for color change and wound.    Physical Exam Updated Vital Signs BP 118/68   Pulse 84   Temp 98.6 F (37 C) (Oral)   Resp 20   Ht 5' (1.524 m)    Wt 49.9 kg (110 lb)   SpO2 97%   BMI 21.48 kg/m   Physical Exam  Constitutional: No distress.  HENT:  Head: Normocephalic. Head is without raccoon's eyes, without Battle's sign, without abrasion, without contusion, without laceration, without right periorbital erythema and without left periorbital erythema.  Right Ear: No hemotympanum.  Left Ear: No hemotympanum.  Nose: No nasal septal hematoma. Right sinus exhibits no maxillary sinus tenderness and no frontal sinus tenderness. Left sinus exhibits no maxillary sinus tenderness and no frontal sinus tenderness.  Mouth/Throat: Uvula is midline.  Eyes: Pupils are equal, round, and reactive to light. Conjunctivae and EOM are normal.  Neck: Neck supple.  Cardiovascular: Normal rate and regular rhythm. Exam reveals no gallop and no friction rub.  No murmur heard. Pulmonary/Chest: Effort normal and breath sounds normal. No stridor. No respiratory distress. She has no wheezes. She has no rales. She exhibits no tenderness.  Bilateral ribs and chest are nontender.  Abdominal: Soft. She exhibits no distension and no mass. There is no tenderness. There is no rebound and no guarding. No hernia.  Abdomen is soft, nontender, nondistended.  Musculoskeletal: She exhibits tenderness. She exhibits no edema or deformity.  No tenderness to the cervical, thoracic, or lumbar spinous processes.  Full range of motion of the neck.  She is minimally tender to palpation over the right shoulder.  No obvious ecchymosis, erythema, edema, or warmth.  The remainder of the bilateral upper extremities are nontender.  Radial pulses are 2+ and symmetric.  Good strength against resistance of bilateral upper extremities.  Sensation is intact.  Bilateral hips, knees, and ankles are nontender to palpation.  She is tender to palpation over the left anterior lower leg.  There is a large hematoma noted to the left anterior lower leg with a small laceration that is hemostatic and  appears clean.  DP and PT pulses are 2+ and symmetric.  Full active and passive range of motion of the bilateral lower extremities.  Good strength against resistance with dorsiflexion plantarflexion.  Sensation is intact and symmetric.  Neurological: She is alert.  Oriented to self.  Skin: Skin is warm. No rash noted.  Psychiatric: Her behavior is normal.  Nursing note and vitals reviewed.        ED Treatments / Results  Labs (all labs ordered are listed, but only abnormal results are displayed) Labs Reviewed  CBC WITH DIFFERENTIAL/PLATELET - Abnormal; Notable for the following components:      Result Value   Neutro Abs 8.6 (*)    All other components within normal limits  COMPREHENSIVE METABOLIC PANEL - Abnormal; Notable for the following components:   Glucose, Bld 107 (*)  Creatinine, Ser 1.14 (*)    Calcium 8.7 (*)    Total Protein 5.6 (*)    Albumin 3.0 (*)    GFR calc non Af Amer 40 (*)    GFR calc Af Amer 46 (*)    All other components within normal limits  URINALYSIS, ROUTINE W REFLEX MICROSCOPIC - Abnormal; Notable for the following components:   APPearance HAZY (*)    Hgb urine dipstick SMALL (*)    Nitrite POSITIVE (*)    Bacteria, UA RARE (*)    All other components within normal limits  TROPONIN I  I-STAT CG4 LACTIC ACID, ED    EKG EKG Interpretation  Date/Time:  Thursday January 29 2018 11:05:31 EDT Ventricular Rate:  69 PR Interval:    QRS Duration: 93 QT Interval:  386 QTC Calculation: 414 R Axis:   85 Text Interpretation:  Sinus rhythm Supraventricular bigeminy Prolonged PR interval Borderline right axis deviation Nonspecific T abnormalities, lateral leads Confirmed by Loren Racer ([id number]) on 01/29/2018 12:35:57 PM   Radiology Dg Shoulder Right  Result Date: 01/29/2018 CLINICAL DATA:  84 year old female status post fall. Altered mental status. EXAM: RIGHT SHOULDER - 2+ VIEW COMPARISON:  Chest radiographs 09/29/2017. FINDINGS: 2 portable views  of the right shoulder. No glenohumeral joint dislocation. Proximal right humerus appears intact. Bone mineralization is stable and normal for age. The right clavicle, scapula, and visible right ribs appear intact. IMPRESSION: No acute fracture or dislocation identified about the right shoulder. Electronically Signed   By: Odessa Fleming M.D.   On: 01/29/2018 10:29   Dg Tibia/fibula Left  Result Date: 01/29/2018 CLINICAL DATA:  84 year old female status post fall with left lower tib fib laceration. EXAM: LEFT TIBIA AND FIBULA - 2 VIEW COMPARISON:  None. FINDINGS: Bone mineralization is within normal limits for age. Alignment is maintained at the left knee and ankle. Moderate to severe lateral compartment knee joint space loss. Multicompartmental degenerative spurring at the knee. The left tibia and fibula appear intact. No acute osseous abnormality identified. Elongated 8-10 centimeter areas of soft tissue lobulation and increased density along the medial tib fib suggesting hematoma (image 2). No soft tissue gas. IMPRESSION: Medial left lower extremity soft tissue hematoma suspected. No acute osseous abnormality identified. Electronically Signed   By: Odessa Fleming M.D.   On: 01/29/2018 10:33   Dg Chest Portable 1 View  Result Date: 01/29/2018 CLINICAL DATA:  84 year old female with altered mental status after fall. EXAM: PORTABLE CHEST 1 VIEW COMPARISON:  Chest radiographs 09/29/2017. FINDINGS: Portable AP semi upright view at 1004 hours. Stable lung volumes. Stable cardiomegaly and mediastinal contours. Visualized tracheal air column is within normal limits. Allowing for portable technique the lungs are clear. No acute osseous abnormality identified. Negative visible bowel gas pattern. Calcified aortic atherosclerosis. IMPRESSION: No acute cardiopulmonary abnormality or acute traumatic injury identified. Electronically Signed   By: Odessa Fleming M.D.   On: 01/29/2018 10:31    Procedures Procedures (including critical care  time)  Medications Ordered in ED Medications  LORazepam (ATIVAN) injection 0.5 mg (0.5 mg Intramuscular Given 01/29/18 0850)     Initial Impression / Assessment and Plan / ED Course  I have reviewed the triage vital signs and the nursing notes.  Pertinent labs & imaging results that were available during my care of the patient were reviewed by me and considered in my medical decision making (see chart for details).     84 year old female arriving from [facility] by EMS with a history of  hypertensive kidney disease, rhabdomyolysis, and dementia presenting with a left lower extremity wound.  EMS reported that the patient fell, but after speaking with staff at [facility] it appears that the patient was found sitting in the floor at the foot of her bed holding her left lower leg and crying out in pain.  In the ED, she appears to be at her baseline mental status.  She is answering simple questions appropriately.  She endorses right shoulder and left lower leg pain.  On exam, she has no other focal tenderness.  No signs of head trauma.  Given unknown mechanism of injury, will order basic labs.  EKG unchanged from previous.  No leukocytosis, but she does have a mildly elevated neutrophil count.  Urine is concerning for infection.  Chest x-ray is negative for fracture or infection.  Right shoulder and left tibia-fibula x-ray is negative.  Labs are otherwise unremarkable.  The patient was discussed with Dr. Ranae PalmsYelverton, attending physician.  When the patient initially arrived, she was attempting to get out of bed likely secondary to dementia.  She was given 0.5 mg of Ativan and was much less agitated on reevaluation.  The pinpoint wound on her left lower leg is hemostatic.  The risk of attempting to suture the wound may outweigh the benefit.  Clean dressing applied to the left lower leg.  Recommend monitoring for signs or symptoms of infection.  Since urine appears infectious, will discharge with reduced dose  of Keflex for UTI.  Strict return precautions given.  The patient is hemodynamically stable and otherwise in no acute distress.  She is safe for discharge to [facility] at this time.  Final Clinical Impressions(s) / ED Diagnoses   Final diagnoses:  Traumatic hematoma  Acute cystitis without hematuria    ED Discharge Orders        Ordered    cephALEXin (KEFLEX) 250 MG capsule  2 times daily     01/29/18 1229       , Coral ElseMia A, PA-C 01/29/18 1244    Loren RacerYelverton, David, MD 01/31/18 1557

## 2018-01-30 ENCOUNTER — Non-Acute Institutional Stay (SKILLED_NURSING_FACILITY): Payer: Medicare Other | Admitting: Internal Medicine

## 2018-01-30 ENCOUNTER — Inpatient Hospital Stay (HOSPITAL_COMMUNITY)
Admission: EM | Admit: 2018-01-30 | Discharge: 2018-02-02 | DRG: 871 | Disposition: A | Discharge status: Skilled Nursing Facility | Payer: Medicare Other | Source: Skilled Nursing Facility | Attending: Family Medicine | Admitting: Family Medicine

## 2018-01-30 ENCOUNTER — Encounter: Payer: Self-pay | Admitting: Internal Medicine

## 2018-01-30 ENCOUNTER — Emergency Department (HOSPITAL_COMMUNITY): Payer: Medicare Other

## 2018-01-30 ENCOUNTER — Encounter (HOSPITAL_COMMUNITY): Payer: Self-pay | Admitting: Family Medicine

## 2018-01-30 DIAGNOSIS — F0390 Unspecified dementia without behavioral disturbance: Secondary | ICD-10-CM | POA: Diagnosis present

## 2018-01-30 DIAGNOSIS — M6282 Rhabdomyolysis: Secondary | ICD-10-CM | POA: Diagnosis not present

## 2018-01-30 DIAGNOSIS — I471 Supraventricular tachycardia, unspecified: Secondary | ICD-10-CM | POA: Diagnosis present

## 2018-01-30 DIAGNOSIS — E875 Hyperkalemia: Secondary | ICD-10-CM | POA: Diagnosis present

## 2018-01-30 DIAGNOSIS — N183 Chronic kidney disease, stage 3 (moderate): Secondary | ICD-10-CM | POA: Diagnosis present

## 2018-01-30 DIAGNOSIS — I509 Heart failure, unspecified: Secondary | ICD-10-CM | POA: Diagnosis not present

## 2018-01-30 DIAGNOSIS — I959 Hypotension, unspecified: Secondary | ICD-10-CM

## 2018-01-30 DIAGNOSIS — T148XXA Other injury of unspecified body region, initial encounter: Secondary | ICD-10-CM

## 2018-01-30 DIAGNOSIS — R4182 Altered mental status, unspecified: Secondary | ICD-10-CM | POA: Diagnosis not present

## 2018-01-30 DIAGNOSIS — S8991XD Unspecified injury of right lower leg, subsequent encounter: Secondary | ICD-10-CM

## 2018-01-30 DIAGNOSIS — I130 Hypertensive heart and chronic kidney disease with heart failure and stage 1 through stage 4 chronic kidney disease, or unspecified chronic kidney disease: Secondary | ICD-10-CM | POA: Diagnosis present

## 2018-01-30 DIAGNOSIS — Z66 Do not resuscitate: Secondary | ICD-10-CM | POA: Diagnosis present

## 2018-01-30 DIAGNOSIS — R0902 Hypoxemia: Secondary | ICD-10-CM | POA: Diagnosis not present

## 2018-01-30 DIAGNOSIS — R451 Restlessness and agitation: Secondary | ICD-10-CM | POA: Diagnosis present

## 2018-01-30 DIAGNOSIS — M6281 Muscle weakness (generalized): Secondary | ICD-10-CM | POA: Diagnosis not present

## 2018-01-30 DIAGNOSIS — J189 Pneumonia, unspecified organism: Secondary | ICD-10-CM | POA: Diagnosis present

## 2018-01-30 DIAGNOSIS — M8580 Other specified disorders of bone density and structure, unspecified site: Secondary | ICD-10-CM | POA: Diagnosis present

## 2018-01-30 DIAGNOSIS — N170 Acute kidney failure with tubular necrosis: Secondary | ICD-10-CM | POA: Diagnosis present

## 2018-01-30 DIAGNOSIS — J181 Lobar pneumonia, unspecified organism: Secondary | ICD-10-CM | POA: Diagnosis not present

## 2018-01-30 DIAGNOSIS — R279 Unspecified lack of coordination: Secondary | ICD-10-CM | POA: Diagnosis not present

## 2018-01-30 DIAGNOSIS — Z823 Family history of stroke: Secondary | ICD-10-CM

## 2018-01-30 DIAGNOSIS — L03116 Cellulitis of left lower limb: Secondary | ICD-10-CM | POA: Diagnosis not present

## 2018-01-30 DIAGNOSIS — I10 Essential (primary) hypertension: Secondary | ICD-10-CM | POA: Diagnosis not present

## 2018-01-30 DIAGNOSIS — L719 Rosacea, unspecified: Secondary | ICD-10-CM | POA: Diagnosis present

## 2018-01-30 DIAGNOSIS — R6521 Severe sepsis with septic shock: Secondary | ICD-10-CM | POA: Diagnosis present

## 2018-01-30 DIAGNOSIS — I5033 Acute on chronic diastolic (congestive) heart failure: Secondary | ICD-10-CM | POA: Diagnosis present

## 2018-01-30 DIAGNOSIS — R000 Tachycardia, unspecified: Secondary | ICD-10-CM

## 2018-01-30 DIAGNOSIS — Z8711 Personal history of peptic ulcer disease: Secondary | ICD-10-CM

## 2018-01-30 DIAGNOSIS — I129 Hypertensive chronic kidney disease with stage 1 through stage 4 chronic kidney disease, or unspecified chronic kidney disease: Secondary | ICD-10-CM | POA: Diagnosis not present

## 2018-01-30 DIAGNOSIS — Z743 Need for continuous supervision: Secondary | ICD-10-CM | POA: Diagnosis not present

## 2018-01-30 DIAGNOSIS — R2681 Unsteadiness on feet: Secondary | ICD-10-CM | POA: Diagnosis not present

## 2018-01-30 DIAGNOSIS — A419 Sepsis, unspecified organism: Secondary | ICD-10-CM | POA: Diagnosis present

## 2018-01-30 DIAGNOSIS — R509 Fever, unspecified: Secondary | ICD-10-CM | POA: Diagnosis not present

## 2018-01-30 DIAGNOSIS — N179 Acute kidney failure, unspecified: Secondary | ICD-10-CM | POA: Diagnosis not present

## 2018-01-30 DIAGNOSIS — S8011XA Contusion of right lower leg, initial encounter: Secondary | ICD-10-CM | POA: Diagnosis present

## 2018-01-30 DIAGNOSIS — J810 Acute pulmonary edema: Secondary | ICD-10-CM | POA: Diagnosis not present

## 2018-01-30 DIAGNOSIS — Z9181 History of falling: Secondary | ICD-10-CM | POA: Diagnosis not present

## 2018-01-30 LAB — CBC WITH DIFFERENTIAL/PLATELET
ABS IMMATURE GRANULOCYTES: 0.1 10*3/uL (ref 0.0–0.1)
BASOS ABS: 0 10*3/uL (ref 0.0–0.1)
Basophils Relative: 0 %
Eosinophils Absolute: 0 10*3/uL (ref 0.0–0.7)
Eosinophils Relative: 0 %
HCT: 38.3 % (ref 36.0–46.0)
HEMOGLOBIN: 12 g/dL (ref 12.0–15.0)
IMMATURE GRANULOCYTES: 1 %
LYMPHS PCT: 20 %
Lymphs Abs: 2.2 10*3/uL (ref 0.7–4.0)
MCH: 30.8 pg (ref 26.0–34.0)
MCHC: 31.3 g/dL (ref 30.0–36.0)
MCV: 98.5 fL (ref 78.0–100.0)
Monocytes Absolute: 0.9 10*3/uL (ref 0.1–1.0)
Monocytes Relative: 8 %
NEUTROS ABS: 8 10*3/uL — AB (ref 1.7–7.7)
Neutrophils Relative %: 71 %
Platelets: 241 10*3/uL (ref 150–400)
RBC: 3.89 MIL/uL (ref 3.87–5.11)
RDW: 14.4 % (ref 11.5–15.5)
WBC: 11.1 10*3/uL — AB (ref 4.0–10.5)

## 2018-01-30 LAB — URINALYSIS, ROUTINE W REFLEX MICROSCOPIC
Bilirubin Urine: NEGATIVE
GLUCOSE, UA: 50 mg/dL — AB
Ketones, ur: NEGATIVE mg/dL
Nitrite: NEGATIVE
PH: 5 (ref 5.0–8.0)
Protein, ur: 100 mg/dL — AB
SPECIFIC GRAVITY, URINE: 1.018 (ref 1.005–1.030)

## 2018-01-30 LAB — COMPREHENSIVE METABOLIC PANEL
ALBUMIN: 3 g/dL — AB (ref 3.5–5.0)
ALK PHOS: 55 U/L (ref 38–126)
ALT: 15 U/L (ref 0–44)
AST: 43 U/L — ABNORMAL HIGH (ref 15–41)
Anion gap: 15 (ref 5–15)
BILIRUBIN TOTAL: 1.8 mg/dL — AB (ref 0.3–1.2)
BUN: 29 mg/dL — AB (ref 8–23)
CALCIUM: 8.4 mg/dL — AB (ref 8.9–10.3)
CO2: 18 mmol/L — ABNORMAL LOW (ref 22–32)
Chloride: 109 mmol/L (ref 98–111)
Creatinine, Ser: 1.84 mg/dL — ABNORMAL HIGH (ref 0.44–1.00)
GFR calc Af Amer: 26 mL/min — ABNORMAL LOW (ref >60)
GFR, EST NON AFRICAN AMERICAN: 22 mL/min — AB (ref >60)
GLUCOSE: 115 mg/dL — AB (ref 70–99)
Potassium: 5.6 mmol/L — ABNORMAL HIGH (ref 3.5–5.1)
Sodium: 142 mmol/L (ref 135–145)
TOTAL PROTEIN: 5.1 g/dL — AB (ref 6.5–8.1)

## 2018-01-30 LAB — I-STAT CG4 LACTIC ACID, ED
LACTIC ACID, VENOUS: 1.94 mmol/L — AB (ref 0.5–1.9)
Lactic Acid, Venous: 4.68 mmol/L (ref 0.5–1.9)

## 2018-01-30 LAB — GLUCOSE, CAPILLARY: Glucose-Capillary: 96 mg/dL (ref 70–99)

## 2018-01-30 LAB — CK: Total CK: 176 U/L (ref 38–234)

## 2018-01-30 LAB — TSH: TSH: 4.553 u[IU]/mL — AB (ref 0.350–4.500)

## 2018-01-30 MED ORDER — ACETAMINOPHEN 650 MG RE SUPP: 650.0000 mg | Freq: Four times a day (QID) | RECTAL | Status: DC | PRN

## 2018-01-30 MED ORDER — SODIUM CHLORIDE 0.9 % IV SOLN
1.0000 g | INTRAVENOUS | Status: DC
  Administered 2018-01-31: 1 g via INTRAVENOUS
  Filled 2018-01-30: qty 1

## 2018-01-30 MED ORDER — ONDANSETRON HCL 4 MG PO TABS: 4.0000 mg | ORAL_TABLET | Freq: Four times a day (QID) | ORAL | Status: DC | PRN

## 2018-01-30 MED ORDER — SODIUM CHLORIDE 0.9% FLUSH: 3.0000 mL | INTRAVENOUS | Status: DC | PRN

## 2018-01-30 MED ORDER — VANCOMYCIN HCL IN DEXTROSE 1-5 GM/200ML-% IV SOLN
1000.0000 mg | Freq: Once | INTRAVENOUS | Status: AC
  Administered 2018-01-30: 1000 mg via INTRAVENOUS
  Filled 2018-01-30: qty 200

## 2018-01-30 MED ORDER — SODIUM CHLORIDE 0.9% FLUSH
3.0000 mL | Freq: Two times a day (BID) | INTRAVENOUS | Status: DC
  Administered 2018-01-31 – 2018-02-01 (×2): 3 mL via INTRAVENOUS

## 2018-01-30 MED ORDER — HALOPERIDOL LACTATE 5 MG/ML IJ SOLN
1.0000 mg | Freq: Four times a day (QID) | INTRAMUSCULAR | Status: DC | PRN
  Administered 2018-01-31 – 2018-02-01 (×2): 1 mg via INTRAVENOUS
  Filled 2018-01-30 (×2): qty 1

## 2018-01-30 MED ORDER — PIPERACILLIN-TAZOBACTAM 3.375 G IVPB 30 MIN
3.3750 g | Freq: Once | INTRAVENOUS | Status: AC
  Administered 2018-01-30: 3.375 g via INTRAVENOUS
  Filled 2018-01-30: qty 50

## 2018-01-30 MED ORDER — ACETAMINOPHEN 325 MG PO TABS: 650.0000 mg | ORAL_TABLET | Freq: Four times a day (QID) | ORAL | Status: DC | PRN

## 2018-01-30 MED ORDER — HYDROCODONE-ACETAMINOPHEN 5-325 MG PO TABS: 1.0000 | ORAL_TABLET | ORAL | Status: DC | PRN

## 2018-01-30 MED ORDER — SODIUM CHLORIDE 0.9 % IV BOLUS
1000.0000 mL | Freq: Once | INTRAVENOUS | Status: AC
  Administered 2018-01-30: 1000 mL via INTRAVENOUS

## 2018-01-30 MED ORDER — SODIUM CHLORIDE 0.9 % IV BOLUS
500.0000 mL | Freq: Once | INTRAVENOUS | Status: AC
  Administered 2018-01-30: 500 mL via INTRAVENOUS

## 2018-01-30 MED ORDER — SODIUM CHLORIDE 0.9 % IV SOLN
250.0000 mL | INTRAVENOUS | Status: DC | PRN
  Administered 2018-01-31 – 2018-02-01 (×3): 250 mL via INTRAVENOUS

## 2018-01-30 MED ORDER — ONDANSETRON HCL 4 MG/2ML IJ SOLN: 4.0000 mg | Freq: Four times a day (QID) | INTRAMUSCULAR | Status: DC | PRN

## 2018-01-30 MED ORDER — HALOPERIDOL LACTATE 5 MG/ML IJ SOLN
1.0000 mg | Freq: Once | INTRAMUSCULAR | Status: AC
  Administered 2018-01-30: 1 mg via INTRAVENOUS
  Filled 2018-01-30: qty 1

## 2018-01-30 MED ORDER — ADULT MULTIVITAMIN W/MINERALS CH
1.0000 | ORAL_TABLET | Freq: Every day | ORAL | Status: DC
  Administered 2018-01-31 – 2018-02-02 (×3): 1 via ORAL
  Filled 2018-01-30 (×3): qty 1

## 2018-01-30 MED ORDER — VANCOMYCIN HCL 500 MG IV SOLR: 500.0000 mg | INTRAVENOUS | Status: DC

## 2018-01-30 MED ORDER — SODIUM CHLORIDE 0.9% FLUSH
3.0000 mL | Freq: Two times a day (BID) | INTRAVENOUS | Status: DC
  Administered 2018-01-31 – 2018-02-02 (×3): 3 mL via INTRAVENOUS

## 2018-01-30 MED ORDER — METOPROLOL SUCCINATE ER 25 MG PO TB24
25.0000 mg | ORAL_TABLET | Freq: Every day | ORAL | Status: DC
  Administered 2018-01-31 – 2018-02-02 (×3): 25 mg via ORAL
  Filled 2018-01-30 (×3): qty 1

## 2018-01-30 MED ORDER — SENNOSIDES-DOCUSATE SODIUM 8.6-50 MG PO TABS: 1.0000 | ORAL_TABLET | Freq: Every evening | ORAL | Status: DC | PRN

## 2018-01-30 NOTE — Progress Notes (Signed)
Pharmacy Antibiotic Note  Madeline SchilderMargaret M Parrish is a 84 y.o. female admitted on 01/30/2018 after being found unresponsive with hypotension and AMS at nursing home  Pharmacy has been consulted for vancomycin dosing for PNA.  She received vancomycin load and Zosyn in the ED, now to be on cefepime.  SCr 1.84, CrCL 15 ml/min, afebrile, WBC 11.1, LA 4.68 > 1.94.   Plan: Vanc 1gm IV x 1, then 500mg  IV Q48H, start 8/4 at 1800 Cefepime 1gm IV Q24H per MD Monitor renal fxn, clinical progress, vanc trough as indicated     Temp (24hrs), Avg:98.2 F (36.8 C), Min:97.1 F (36.2 C), Max:99.3 F (37.4 C)  Recent Labs  Lab 01/29/18 0952 01/29/18 1008 01/30/18 1724 01/30/18 1744 01/30/18 2009 01/30/18 2015  WBC 10.3  --   --   --  11.1*  --   CREATININE 1.14*  --  1.84*  --   --   --   LATICACIDVEN  --  1.43  --  4.68*  --  1.94*    Estimated Creatinine Clearance: 14.7 mL/min (A) (by C-G formula based on SCr of 1.84 mg/dL (H)).    No Known Allergies   Vanc 8/2 >> Cefepime 8/2 >>  8/2 BCx -  8/2 UCx -   Kahli Mayon D. Laney Potashang, PharmD, BCPS, BCCCP 01/30/2018, 10:39 PM

## 2018-01-30 NOTE — ED Notes (Signed)
DNR paperwork, clothes/glasses given to [facility] RN

## 2018-01-30 NOTE — ED Notes (Signed)
MD aware of pt's continued tachycardia

## 2018-01-30 NOTE — ED Provider Notes (Signed)
[facility]ENC[facility] Provider Note   CSN: [id number] Arrival date & time: 01/30/18  1705     History   Chief Complaint Chief Complaint  Patient presents with  . Hypotension    HPI Madeline Parrish is a 84 y.o. female.  84 yo F with a chief complaint of altered mental status.  The patient was found unresponsive in the nursing home was noted to be hypotensive and tachycardic.  She was given a liter of IV fluids and then became agitated and was sent to the emergency department.  Patient is unable to provide further history.  Level 5 caveat altered mental status.   Old records were reviewed and the patient was seen in the emergency department yesterday.  She was sent in due to a possible fall where she had a small punctate wound to the left lower leg.  She had a work-up done that and found she may have a urinary tract infection and was started on antibiotics.  The history is provided by the patient and the nursing home.  Illness  This is a new problem. The current episode started yesterday. The problem occurs constantly. The problem has not changed since onset.Pertinent negatives include no chest pain, no headaches and no shortness of breath. Nothing aggravates the symptoms. Nothing relieves the symptoms. She has tried nothing for the symptoms. The treatment provided no relief.    Past Medical History:  Diagnosis Date  . Acne rosacea   . CKD (chronic kidney disease), stage III (HCC)   . Depression   . Hypertension   . Osteopenia   . PUD (peptic ulcer disease)   . Restrictive lung disease   . Shingles   . Tinnitus    and vertigo    Patient Active Problem List   Diagnosis Date Noted  . Acute on chronic diastolic CHF (congestive heart failure) (HCC) 01/30/2018  . Sepsis due to pneumonia (HCC) 01/30/2018  . Agitation 01/30/2018  . Right leg injury, subsequent encounter 01/30/2018  . Benign hypertension with chronic kidney disease, stage III  (HCC) 10/06/2017  . Chronic kidney disease, stage III (moderate) (HCC) 10/06/2017  . Restrictive airway disease 10/06/2017  . Fall 09/29/2017  . Acute renal failure superimposed on stage 3 chronic kidney disease (HCC) 09/29/2017  . Bilateral lower extremity edema 08/15/2015  . Paroxysmal supraventricular tachycardia (HCC) 12/06/2013  . Essential hypertension, benign 12/06/2013  . Syncope and collapse 12/06/2013    History reviewed. No pertinent surgical history.   OB History   None      Home Medications    Prior to Admission medications   Medication Sig Start Date End Date Taking? Authorizing Provider  cephALEXin (KEFLEX) 250 MG capsule Take 1 capsule (250 mg total) by mouth 2 (two) times daily for 5 days. 01/29/18 02/03/18 Yes McDonald, Mia A, PA-C  furosemide (LASIX) 20 MG tablet Take 20 mg by mouth every other day.    Yes [provider]  losartan (COZAAR) 25 MG tablet Take 25 mg by mouth daily.   Yes [provider]  metoprolol succinate (TOPROL-XL) 25 MG 24 hr tablet Take 25 mg by mouth daily.  09/26/17  Yes [provider]  Multiple Vitamins-Minerals (CENTRUM SILVER 50+WOMEN) TABS Take 1 tablet by mouth daily.   Yes [provider]  NON FORMULARY Medpass : Give 120 ml by mouth twice daily as suplement   Yes [provider]  potassium chloride (K-DUR,KLOR-CON) 10 MEQ tablet Take 10 mEq by mouth every  other day.    Yes [provider]    Family History Family History  Problem Relation Age of Onset  . CVA Mother     Social History Social History   Tobacco Use  . Smoking status: Never Smoker  . Smokeless tobacco: Never Used  Substance Use Topics  . Alcohol use: No  . Drug use: No     Allergies   Patient has no known allergies.   Review of Systems Review of Systems  Unable to perform ROS: Dementia  Constitutional: Negative for chills and fever.  HENT: Negative for congestion and rhinorrhea.   Eyes: Negative  for redness and visual disturbance.  Respiratory: Negative for shortness of breath and wheezing.   Cardiovascular: Negative for chest pain and palpitations.  Gastrointestinal: Negative for nausea and vomiting.  Genitourinary: Negative for dysuria and urgency.  Musculoskeletal: Negative for arthralgias and myalgias.  Skin: Negative for pallor and wound.  Neurological: Negative for dizziness and headaches.     Physical Exam Updated Vital Signs BP (!) 153/90 (BP Location: Right Arm)   Pulse 66   Temp 99.3 F (37.4 C) (Rectal)   Resp 15   SpO2 99%   Physical Exam  Constitutional: She appears well-developed and well-nourished. No distress.  Confused, agitated  HENT:  Head: Normocephalic and atraumatic.  Eyes: Pupils are equal, round, and reactive to light. EOM are normal.  Neck: Normal range of motion. Neck supple.  Cardiovascular: Regular rhythm. Tachycardia present. Exam reveals no gallop and no friction rub.  No murmur heard. Pulmonary/Chest: Effort normal. She has no wheezes. She has no rales.  Abdominal: Soft. She exhibits no distension. There is no tenderness. There is no guarding.  Musculoskeletal: She exhibits no edema or tenderness.  Neurological: She is alert.  Skin: Skin is warm and dry. She is not diaphoretic.  Nursing note and vitals reviewed.    ED Treatments / Results  Labs (all labs ordered are listed, but only abnormal results are displayed) Labs Reviewed  COMPREHENSIVE METABOLIC PANEL - Abnormal; Notable for the following components:      Result Value   Potassium 5.6 (*)    CO2 18 (*)    Glucose, Bld 115 (*)    BUN 29 (*)    Creatinine, Ser 1.84 (*)    Calcium 8.4 (*)    Total Protein 5.1 (*)    Albumin 3.0 (*)    AST 43 (*)    Total Bilirubin 1.8 (*)    GFR calc non Af Amer 22 (*)    GFR calc Af Amer 26 (*)    All other components within normal limits  URINALYSIS, ROUTINE W REFLEX MICROSCOPIC - Abnormal; Notable for the following components:    APPearance CLOUDY (*)    Glucose, UA 50 (*)    Hgb urine dipstick SMALL (*)    Protein, ur 100 (*)    Leukocytes, UA LARGE (*)    Bacteria, UA FEW (*)    All other components within normal limits  TSH - Abnormal; Notable for the following components:   TSH 4.553 (*)    All other components within normal limits  CBC WITH DIFFERENTIAL/PLATELET - Abnormal; Notable for the following components:   WBC 11.1 (*)    Neutro Abs 8.0 (*)    All other components within normal limits  I-STAT CG4 LACTIC ACID, ED - Abnormal; Notable for the following components:   Lactic Acid, Venous 4.68 (*)    All other components within normal limits  I-STAT CG4 LACTIC ACID, ED - Abnormal; Notable for the following components:   Lactic Acid, Venous 1.94 (*)    All other components within normal limits  CULTURE, BLOOD (ROUTINE X 2)  CULTURE, BLOOD (ROUTINE X 2)  URINE CULTURE  EXPECTORATED SPUTUM ASSESSMENT W REFEX TO RESP CULTURE  GRAM STAIN  CK  GLUCOSE, CAPILLARY  CBC WITH DIFFERENTIAL/PLATELET  T4  STREP PNEUMONIAE URINARY ANTIGEN  COMPREHENSIVE METABOLIC PANEL  CBC WITH DIFFERENTIAL/PLATELET  LEGIONELLA PNEUMOPHILA SEROGP 1 UR AG  PROCALCITONIN  LACTIC ACID, PLASMA  SODIUM, URINE, RANDOM  UREA NITROGEN, URINE  CREATININE, URINE, RANDOM  POTASSIUM    EKG EKG Interpretation  Date/Time:  Friday January 30 2018 17:21:46 EDT Ventricular Rate:  163 PR Interval:    QRS Duration: 106 QT Interval:  298 QTC Calculation: 491 R Axis:   82 Text Interpretation:  Supraventricular tachycardia Borderline right axis deviation ST depression, probably rate related Since last tracing rate faster st changes likely rate related Otherwise no significant change Confirmed by Melene Plan [phone]) on 01/30/2018 5:26:42 PM   Radiology Dg Shoulder Right  Result Date: 01/29/2018 CLINICAL DATA:  84 year old female status post fall. Altered mental status. EXAM: RIGHT SHOULDER - 2+ VIEW COMPARISON:  Chest radiographs  09/29/2017. FINDINGS: 2 portable views of the right shoulder. No glenohumeral joint dislocation. Proximal right humerus appears intact. Bone mineralization is stable and normal for age. The right clavicle, scapula, and visible right ribs appear intact. IMPRESSION: No acute fracture or dislocation identified about the right shoulder. Electronically Signed   By: Odessa Fleming M.D.   On: 01/29/2018 10:29   Dg Tibia/fibula Left  Result Date: 01/29/2018 CLINICAL DATA:  84 year old female status post fall with left lower tib fib laceration. EXAM: LEFT TIBIA AND FIBULA - 2 VIEW COMPARISON:  None. FINDINGS: Bone mineralization is within normal limits for age. Alignment is maintained at the left knee and ankle. Moderate to severe lateral compartment knee joint space loss. Multicompartmental degenerative spurring at the knee. The left tibia and fibula appear intact. No acute osseous abnormality identified. Elongated 8-10 centimeter areas of soft tissue lobulation and increased density along the medial tib fib suggesting hematoma (image 2). No soft tissue gas. IMPRESSION: Medial left lower extremity soft tissue hematoma suspected. No acute osseous abnormality identified. Electronically Signed   By: Odessa Fleming M.D.   On: 01/29/2018 10:33   Dg Chest Port 1 View  Result Date: 01/30/2018 CLINICAL DATA:  84 year old found lethargic and unresponsive at the nursing home. Hypotensive upon EMS arrival (60/30). EXAM: PORTABLE CHEST 1 VIEW COMPARISON:  01/29/2018, 09/29/2017. FINDINGS: Cardiac silhouette moderately to markedly enlarged. Thoracic aorta tortuous and atherosclerotic. Interval development of mild diffuse interstitial pulmonary edema. More focal patchy airspace opacities at the LEFT lung base. No focal airspace opacities elsewhere. IMPRESSION: 1. Stable moderate to marked cardiomegaly. Mild diffuse interstitial pulmonary edema indicates mild CHF and/or fluid overload. 2. Focal airspace opacities at the LEFT lung base, query  superimposed pneumonia. Electronically Signed   By: Hulan Saas M.D.   On: 01/30/2018 18:04   Dg Chest Portable 1 View  Result Date: 01/29/2018 CLINICAL DATA:  84 year old female with altered mental status after fall. EXAM: PORTABLE CHEST 1 VIEW COMPARISON:  Chest radiographs 09/29/2017. FINDINGS: Portable AP semi upright view at 1004 hours. Stable lung volumes. Stable cardiomegaly and mediastinal contours. Visualized tracheal air column is within normal limits. Allowing for portable technique the lungs are clear. No acute osseous abnormality identified. Negative visible bowel gas pattern. Calcified aortic  atherosclerosis. IMPRESSION: No acute cardiopulmonary abnormality or acute traumatic injury identified. Electronically Signed   By: Odessa Fleming M.D.   On: 01/29/2018 10:31    Procedures Procedures (including critical care time)  Medications Ordered in ED Medications  metoprolol succinate (TOPROL-XL) 24 hr tablet 25 mg (has no administration in time range)  multivitamin with minerals tablet 1 tablet (has no administration in time range)  sodium chloride flush (NS) 0.9 % injection 3 mL (has no administration in time range)  sodium chloride flush (NS) 0.9 % injection 3 mL (has no administration in time range)  sodium chloride flush (NS) 0.9 % injection 3 mL (has no administration in time range)  0.9 %  sodium chloride infusion (has no administration in time range)  acetaminophen (TYLENOL) tablet 650 mg (has no administration in time range)    Or  acetaminophen (TYLENOL) suppository 650 mg (has no administration in time range)  HYDROcodone-acetaminophen (NORCO/VICODIN) 5-325 MG per tablet 1-2 tablet (has no administration in time range)  senna-docusate (Senokot-S) tablet 1 tablet (has no administration in time range)  ondansetron (ZOFRAN) tablet 4 mg (has no administration in time range)    Or  ondansetron (ZOFRAN) injection 4 mg (has no administration in time range)  ceFEPIme (MAXIPIME) 1 g  in sodium chloride 0.9 % 100 mL IVPB (has no administration in time range)  haloperidol lactate (HALDOL) injection 1 mg (has no administration in time range)  vancomycin (VANCOCIN) 500 mg in sodium chloride 0.9 % 100 mL IVPB (has no administration in time range)  haloperidol lactate (HALDOL) injection 1 mg (1 mg Intravenous Given 01/30/18 1936)  sodium chloride 0.9 % bolus 1,000 mL (0 mLs Intravenous Stopped 01/30/18 1838)  vancomycin (VANCOCIN) IVPB 1000 mg/200 mL premix (0 mg Intravenous Stopped 01/30/18 1912)  piperacillin-tazobactam (ZOSYN) IVPB 3.375 g (0 g Intravenous Stopped 01/30/18 1809)  sodium chloride 0.9 % bolus 500 mL (0 mLs Intravenous Stopped 01/30/18 2102)     Initial Impression / Assessment and Plan / ED Course  I have reviewed the triage vital signs and the nursing notes.  Pertinent labs & imaging results that were available during my care of the patient were reviewed by me and considered in my medical decision making (see chart for details).     84 yo F with a chief complaint of altered mental status.  Patient was seen by her provider at her nursing home and was noted to be less responsive.  There she was found to be tachycardic and hypotensive.  Was seen in the ED yesterday and had a work-up that involved urine and chest x-ray.  She positive nitrites but nothing else was found on the work-up.  Started on antibiotics presumptively for UTI.  The patient here is tachycardic and hypotensive.  She has a lactate of 4.6.  Chest x-ray reviewed by me with new left lower lobe opacity, started on broad-spectrum antibiotics.  Blood pressure is improved with IV fluids.  Remains somewhat tachycardic though is afebrile rectally.  Lactate cleared with IV fluids.  Tachycardia and hypotension improved as well.  Will admit. Sepsis - Repeat Assessment  Performed at:    2100  Vitals     Blood pressure (!) 153/90, pulse 66, temperature 99.3 F (37.4 C), temperature source Rectal, resp. rate 15, SpO2  99 %.  Heart:     Tachycardic  Lungs:    CTA and Rhonchi  Capillary Refill:   <2 sec  Peripheral Pulse:   Radial pulse palpable  Skin:  Normal Color    CRITICAL CARE Performed by: Rae Roam   Total critical care time: 80 minutes  Critical care time was exclusive of separately billable procedures and treating other patients.  Critical care was necessary to treat or prevent imminent or life-threatening deterioration.  Critical care was time spent personally by me on the following activities: development of treatment plan with patient and/or surrogate as well as nursing, discussions with consultants, evaluation of patient's response to treatment, examination of patient, obtaining history from patient or surrogate, ordering and performing treatments and interventions, ordering and review of laboratory studies, ordering and review of radiographic studies, pulse oximetry and re-evaluation of patient's condition.  The patients results and plan were reviewed and discussed.   Any x-rays performed were independently reviewed by myself.   Differential diagnosis were considered with the presenting HPI.  Medications  metoprolol succinate (TOPROL-XL) 24 hr tablet 25 mg (has no administration in time range)  multivitamin with minerals tablet 1 tablet (has no administration in time range)  sodium chloride flush (NS) 0.9 % injection 3 mL (has no administration in time range)  sodium chloride flush (NS) 0.9 % injection 3 mL (has no administration in time range)  sodium chloride flush (NS) 0.9 % injection 3 mL (has no administration in time range)  0.9 %  sodium chloride infusion (has no administration in time range)  acetaminophen (TYLENOL) tablet 650 mg (has no administration in time range)    Or  acetaminophen (TYLENOL) suppository 650 mg (has no administration in time range)  HYDROcodone-acetaminophen (NORCO/VICODIN) 5-325 MG per tablet 1-2 tablet (has no administration in  time range)  senna-docusate (Senokot-S) tablet 1 tablet (has no administration in time range)  ondansetron (ZOFRAN) tablet 4 mg (has no administration in time range)    Or  ondansetron (ZOFRAN) injection 4 mg (has no administration in time range)  ceFEPIme (MAXIPIME) 1 g in sodium chloride 0.9 % 100 mL IVPB (has no administration in time range)  haloperidol lactate (HALDOL) injection 1 mg (has no administration in time range)  vancomycin (VANCOCIN) 500 mg in sodium chloride 0.9 % 100 mL IVPB (has no administration in time range)  haloperidol lactate (HALDOL) injection 1 mg (1 mg Intravenous Given 01/30/18 1936)  sodium chloride 0.9 % bolus 1,000 mL (0 mLs Intravenous Stopped 01/30/18 1838)  vancomycin (VANCOCIN) IVPB 1000 mg/200 mL premix (0 mg Intravenous Stopped 01/30/18 1912)  piperacillin-tazobactam (ZOSYN) IVPB 3.375 g (0 g Intravenous Stopped 01/30/18 1809)  sodium chloride 0.9 % bolus 500 mL (0 mLs Intravenous Stopped 01/30/18 2102)    Vitals:   01/30/18 2200 01/30/18 2205 01/30/18 2230 01/30/18 2337  BP:  (!) 168/99 128/72 (!) 153/90  Pulse: 87 89 75 66  Resp: 20 (!) 24 16 15   Temp:      TempSrc:      SpO2: 96% 95% 94% 99%    Final diagnoses:  Septic shock (HCC)    Admission/ observation were discussed with the admitting physician, patient and/or family and they are comfortable with the plan.   Final Clinical Impressions(s) / ED Diagnoses   Final diagnoses:  Septic shock [facility])    ED Discharge Orders    None       Melene Plan, DO 01/30/18 2355

## 2018-01-30 NOTE — Progress Notes (Signed)
Nursing Home Room Number: 205D Place of Service:  SNF (31)   CODE STATUS: DNR  No Known Allergies  Chief Complaint  Patient presents with  . Acute Visit    Low blood pressure and tachycardia    HPI:  Patient is a pleasant 84 year old female who was noted this afternoon to have tachycardia with pulse around 1 30-1 50 as well as significantly low systolic blood pressure in the 70s  Per nursing she also appeared to be increasingly weak.  Patient does have a history of hypertension as well as chronic kidney disease rhabdomylolysis and dementia.  As well as paroxysmal supraventricular tachycardia on Toprol  She was actually in the ER yesterday after falling at the facility and found in a puddle of blood her left calf had a significant hematoma on.  Blood pressure actually was elevated at 210/116 heart rate was 82-90.   Work up-- the hospital was negative for any acute fracture- blood work also appeared to be fairly stable her hemoglobin was 13.5   She was thought to have a UTI and was started on Keflex  It was thought the risk of attempting to suture the wound outweighed the benefit-clear dressing was applied- recommendation  was to return to ER for significant change.  Apparently earlier today patient was at her baseline ate breakfast appear to be doing well However  after after lunch apparently she was noted to be somewhat weaker possibly more confused-  Vital signs were taken which revealed a significantly low systolic blood pressure in the 70s she was also tachycardic with a pulse around 150  On evaluation she was alert although  staff thought she was a bit more confused than usual she was lying in bed and did not appear to be uncomfortable.  But was quite weak  I did take her pulse manually and got between 130-150- blood pressure was difficult to obtain but eventually I auscultated a systolic approximately mid 50s  Per nursing the hematoma left lower leg appeared to  be increasing in size  Past Medical History:  Diagnosis Date  . Acne rosacea   . CKD (chronic kidney disease), stage III (HCC)   . Depression   . Hypertension   . Osteopenia   . PUD (peptic ulcer disease)   . Restrictive lung disease   . Shingles   . Tinnitus    and vertigo    History reviewed. No pertinent surgical history.  Social History   Socioeconomic History  . Marital status: Divorced    Spouse name: Not on file  . Number of children: Not on file  . Years of education: Not on file  . Highest education level: Not on file  Occupational History  . Occupation: retired  Engineer, production  . Financial resource strain: Not on file  . Food insecurity:    Worry: Not on file    Inability: Not on file  . Transportation needs:    Medical: Not on file    Non-medical: Not on file  Tobacco Use  . Smoking status: Never Smoker  . Smokeless tobacco: Never Used  Substance and Sexual Activity  . Alcohol use: No  . Drug use: No  . Sexual activity: Not on file  Lifestyle  . Physical activity:    Days per week: Not on file    Minutes per session: Not on file  . Stress: Not on file  Relationships  . Social connections:    Talks on phone: Not  on file    Gets together: Not on file    Attends religious service: Not on file    Active member of club or organization: Not on file    Attends meetings of clubs or organizations: Not on file    Relationship status: Not on file  . Intimate partner violence:    Fear of current or ex partner: Not on file    Emotionally abused: Not on file    Physically abused: Not on file    Forced sexual activity: Not on file  Other Topics Concern  . Not on file  Social History Narrative  . Not on file   Family History  Problem Relation Age of Onset  . CVA Mother    Review of systems-this is largely unobtainable secondary to dementia but when asked the patient denies having any pain or discomfort at this time's says she feels weak but is not really  complaining of feeling dizzy or having any chest pains or nausea   VITAL SIGNS She is afebrile pulse of 140- respirations of 16- blood pressure difficult to auscultate appear to be systolically in the 70-50 range- oxygen  saturation is in the 90s on room air CBG is in the mid 200s  In general this is a pleasant elderly female who is weak but does not appear to be in any distress she is alert   Skin is warm and dry hematoma left shin does have dressing however it appears to be increasing according to nursing staff   Eyes sclera and conjunctive are clear visual acuity appears grossly intact.  Oropharynx is clear   Chest is clear to auscultation with shallow air entry and somewhat poor respiratory effort   Heart is tachycardic with pulse between 130 and 150 she does not really have significant lower extremity edema   Abdomen is soft does not appear to be tender there are positive bowel sounds   Musculoskeletal hematoma on left lower leg as noted above.  She does have lower extremity weakness although I do not believe this is new- is able to move her upper extremities but does have bilateral weakness      Neurologic as noted above she is alert her speech is clear but she did not speak a lot   Psych findings consistent with dementia and she does answer questions but gives short yes and no answers  Outpatient Encounter Medications as of 01/30/2018  Medication Sig  . cephALEXin (KEFLEX) 250 MG capsule Take 1 capsule (250 mg total) by mouth 2 (two) times daily for 5 days.  . furosemide (LASIX) 20 MG tablet Take 20 mg by mouth every other day.   . losartan (COZAAR) 25 MG tablet Take 25 mg by mouth daily.  . metoprolol succinate (TOPROL-XL) 25 MG 24 hr tablet Take 25 mg by mouth daily.   . Multiple Vitamins-Minerals (CENTRUM SILVER 50+WOMEN) TABS Take 1 tablet by mouth daily.  . NON FORMULARY Medpass : Give 120 ml by mouth twice daily as suplement  . potassium chloride  (K-DUR,KLOR-CON) 10 MEQ tablet Take 10 mEq by mouth every other day.    No facility-administered encounter medications on file as of 01/30/2018.    Labs.  January 29, 2018.  WBC 10.3 hemoglobin 13.5 platelets 242  Sodium 143 potassium 3.5 BUN 17 creatinine 1.14.  Albumin 3.0         Assessment and plan.  1.  Hypotension with tachycardia- patient has a significantly depressed blood pressure and elevated heart rate-  she does not appear to be in any acute distress but certainly is vulnerable send her to the ER for emergent evaluation.   [id number]       Edmon Crape, PA [facility]  Contact [phone] Monday through Friday 8am- 5pm  After hours call [phone]

## 2018-01-30 NOTE — H&P (Signed)
History and Physical    Madeline Parrish ZOX:[id number] DOB: 1921/07/31 DOA: 01/30/2018  PCP: Iva Boop, MD   Patient coming from: SNF   Chief Complaint: Tachycardia, hypotension   HPI: Madeline Parrish is a 84 y.o. female with medical history significant for chronic kidney disease stage III, hypertension, and paroxysmal SVT, now presenting from her nursing facility for evaluation of hypotension and tachycardia.  Patient had fallen at the SNF, suffered a injury to her lower right leg with significant bleeding, and was evaluated in the emergency department for this yesterday.  Hemostasis was achieved, radiographs were negative for fracture, hemoglobin remained normal, and she was discharged back to SNF with Keflex for suspected UTI.  Today, she has been tachycardic up into the 150s and 160s with SBP in the 70s.  EMS was called out and she reportedly had a SBP of 60, remained markedly tachycardic, and was treated with 1 L of IV fluid prior to arrival in the ED.  Patient is unable to contribute to the history secondary to her clinical condition.  ED Course: Upon arrival to the ED, patient is found to be afebrile, saturating adequately on 2 L/min supplemental oxygen, mildly tachypneic, tachycardic in the 160s, and with blood pressure 81/60.  EKG features SVT with rate 163.  Chest x-ray is notable for stable cardiomegaly and mild diffuse interstitial edema, as well as focal opacity at the left lung base concerning for pneumonia.  Chemistry panel is notable for potassium of 5.6, bicarbonate 18, and creatinine 1.84, up from 1.14 the day before.  CBC is notable for leukocytosis to 11,100.  Lactic acid was elevated to 4.68.  Urinalysis features large leukocytes and negative nitrites.  Blood and urine cultures were collected, 1.5 L normal saline administered, and the patient was started on empiric vancomycin and Zosyn.  She was also given 1 mg IV Haldol for agitation.  Lactic acid improved, blood pressure  normalized, and SVT resolved in the ED.  She will be admitted for ongoing evaluation and management of this.  Review of Systems:  Unable to complete ROS secondary to patient's clinical condition.  Past Medical History:  Diagnosis Date  . Acne rosacea   . CKD (chronic kidney disease), stage III (HCC)   . Depression   . Hypertension   . Osteopenia   . PUD (peptic ulcer disease)   . Restrictive lung disease   . Shingles   . Tinnitus    and vertigo    History reviewed. No pertinent surgical history.   reports that she has never smoked. She has never used smokeless tobacco. She reports that she does not drink alcohol or use drugs.  No Known Allergies  Family History  Problem Relation Age of Onset  . CVA Mother      Prior to Admission medications   Medication Sig Start Date End Date Taking? Authorizing Provider  cephALEXin (KEFLEX) 250 MG capsule Take 1 capsule (250 mg total) by mouth 2 (two) times daily for 5 days. 01/29/18 02/03/18  McDonald, Mia A, PA-C  furosemide (LASIX) 20 MG tablet Take 20 mg by mouth every other day.     [provider]  losartan (COZAAR) 25 MG tablet Take 25 mg by mouth daily.    [provider]  metoprolol succinate (TOPROL-XL) 25 MG 24 hr tablet Take 25 mg by mouth daily.  09/26/17   [provider]  Multiple Vitamins-Minerals (CENTRUM SILVER 50+WOMEN) TABS Take 1 tablet by mouth daily.    [provider]  NON FORMULARY Medpass : Give 120 ml by mouth twice daily as suplement    [provider]  potassium chloride (K-DUR,KLOR-CON) 10 MEQ tablet Take 10 mEq by mouth every other day.     [provider]    Physical Exam: Vitals:   01/30/18 2111 01/30/18 2130 01/30/18 2200 01/30/18 2205  BP: (!) 144/76 (!) 157/73  (!) 168/99  Pulse: 84 81 87 89  Resp: 19 17 20  (!) 24  Temp:      TempSrc:      SpO2: 100% 98% 96% 95%      Constitutional: NAD, in apparent discomfort   Eyes: PERTLA, lids and  conjunctivae normal ENMT: Mucous membranes are moist. Posterior pharynx clear of any exudate or lesions.   Neck: normal, supple, no masses, no thyromegaly Respiratory: Fine rales bilaterally, rhonchi in bases. Mild tachypnea. No accessory muscle use.  Cardiovascular: S1 & S2 heard, regular rate and rhythm. Trace pedal edema bilaterally.   Abdomen: No distension, no tenderness, soft. Bowel sounds normal.  Musculoskeletal: no clubbing / cyanosis. No joint deformity upper and lower extremities.   Skin: Hematoma and small laceration at lower right leg with soft compartments and no active bleeding. Warm, dry, well-perfused. Neurologic: No facial asymmetry. Patellar DTRs intact. Moving all extremities.  Psychiatric: Alert. Agitated, unable to be redirected.      Labs on Admission: I have personally reviewed following labs and imaging studies  CBC: Recent Labs  Lab 01/29/18 0952 01/30/18 2009  WBC 10.3 11.1*  NEUTROABS 8.6* 8.0*  HGB 13.5 12.0  HCT 41.3 38.3  MCV 95.6 98.5  PLT 242 241   Basic Metabolic Panel: Recent Labs  Lab 01/29/18 0952 01/30/18 1724  NA 143 142  K 3.5 5.6*  CL 108 109  CO2 27 18*  GLUCOSE 107* 115*  BUN 17 29*  CREATININE 1.14* 1.84*  CALCIUM 8.7* 8.4*   GFR: Estimated Creatinine Clearance: 14.7 mL/min (A) (by C-G formula based on SCr of 1.84 mg/dL (H)). Liver Function Tests: Recent Labs  Lab 01/29/18 0952 01/30/18 1724  AST 17 43*  ALT 12 15  ALKPHOS 51 55  BILITOT 0.8 1.8*  PROT 5.6* 5.1*  ALBUMIN 3.0* 3.0*   No results for input(s): LIPASE, AMYLASE in the last 168 hours. No results for input(s): AMMONIA in the last 168 hours. Coagulation Profile: No results for input(s): INR, PROTIME in the last 168 hours. Cardiac Enzymes: Recent Labs  Lab 01/29/18 0952 01/30/18 1724  CKTOTAL  --  176  TROPONINI <0.03  --    BNP (last 3 results) No results for input(s): PROBNP in the last 8760 hours. HbA1C: No results for input(s): HGBA1C in the  last 72 hours. CBG: No results for input(s): GLUCAP in the last 168 hours. Lipid Profile: No results for input(s): CHOL, HDL, LDLCALC, TRIG, CHOLHDL, LDLDIRECT in the last 72 hours. Thyroid Function Tests: Recent Labs    01/30/18 1841  TSH 4.553*   Anemia Panel: No results for input(s): VITAMINB12, FOLATE, FERRITIN, TIBC, IRON, RETICCTPCT in the last 72 hours. Urine analysis:    Component Value Date/Time   COLORURINE YELLOW 01/30/2018 1931   APPEARANCEUR CLOUDY (A) 01/30/2018 1931   LABSPEC 1.018 01/30/2018 1931   PHURINE 5.0 01/30/2018 1931   GLUCOSEU 50 (A) 01/30/2018 1931   HGBUR SMALL (A) 01/30/2018 1931   BILIRUBINUR NEGATIVE 01/30/2018 1931   KETONESUR NEGATIVE 01/30/2018 1931   PROTEINUR 100 (A) 01/30/2018 1931   NITRITE NEGATIVE 01/30/2018 1931  LEUKOCYTESUR LARGE (A) 01/30/2018 1931   Sepsis Labs: @LABRCNTIP (procalcitonin:4,lacticidven:4) )No results found for this or any previous visit (from the past 240 hour(s)).   Radiological Exams on Admission: Dg Shoulder Right  Result Date: 01/29/2018 CLINICAL DATA:  84 year old female status post fall. Altered mental status. EXAM: RIGHT SHOULDER - 2+ VIEW COMPARISON:  Chest radiographs 09/29/2017. FINDINGS: 2 portable views of the right shoulder. No glenohumeral joint dislocation. Proximal right humerus appears intact. Bone mineralization is stable and normal for age. The right clavicle, scapula, and visible right ribs appear intact. IMPRESSION: No acute fracture or dislocation identified about the right shoulder. Electronically Signed   By: Odessa FlemingH  Hall M.D.   On: 01/29/2018 10:29   Dg Tibia/fibula Left  Result Date: 01/29/2018 CLINICAL DATA:  84 year old female status post fall with left lower tib fib laceration. EXAM: LEFT TIBIA AND FIBULA - 2 VIEW COMPARISON:  None. FINDINGS: Bone mineralization is within normal limits for age. Alignment is maintained at the left knee and ankle. Moderate to severe lateral compartment knee joint  space loss. Multicompartmental degenerative spurring at the knee. The left tibia and fibula appear intact. No acute osseous abnormality identified. Elongated 8-10 centimeter areas of soft tissue lobulation and increased density along the medial tib fib suggesting hematoma (image 2). No soft tissue gas. IMPRESSION: Medial left lower extremity soft tissue hematoma suspected. No acute osseous abnormality identified. Electronically Signed   By: Odessa FlemingH  Hall M.D.   On: 01/29/2018 10:33   Dg Chest Port 1 View  Result Date: 01/30/2018 CLINICAL DATA:  84 year old found lethargic and unresponsive at the nursing home. Hypotensive upon EMS arrival (60/30). EXAM: PORTABLE CHEST 1 VIEW COMPARISON:  01/29/2018, 09/29/2017. FINDINGS: Cardiac silhouette moderately to markedly enlarged. Thoracic aorta tortuous and atherosclerotic. Interval development of mild diffuse interstitial pulmonary edema. More focal patchy airspace opacities at the LEFT lung base. No focal airspace opacities elsewhere. IMPRESSION: 1. Stable moderate to marked cardiomegaly. Mild diffuse interstitial pulmonary edema indicates mild CHF and/or fluid overload. 2. Focal airspace opacities at the LEFT lung base, query superimposed pneumonia. Electronically Signed   By: Hulan Saashomas  Lawrence M.D.   On: 01/30/2018 18:04   Dg Chest Portable 1 View  Result Date: 01/29/2018 CLINICAL DATA:  84 year old female with altered mental status after fall. EXAM: PORTABLE CHEST 1 VIEW COMPARISON:  Chest radiographs 09/29/2017. FINDINGS: Portable AP semi upright view at 1004 hours. Stable lung volumes. Stable cardiomegaly and mediastinal contours. Visualized tracheal air column is within normal limits. Allowing for portable technique the lungs are clear. No acute osseous abnormality identified. Negative visible bowel gas pattern. Calcified aortic atherosclerosis. IMPRESSION: No acute cardiopulmonary abnormality or acute traumatic injury identified. Electronically Signed   By: Odessa FlemingH  Hall  M.D.   On: 01/29/2018 10:31    EKG: Independently reviewed. SVT, rate 163.   Assessment/Plan  1. Sepsis suspected secondary to PNA  - Presents from SNF with tachycardia, hypotension, and agitation  - Found to be in SVT with leukocytosis, elevated lactate, and CXR findings concerning for pneumonia  - UA also suggestive of infection; abd exam benign; no meningismus  - She was given 1.5 liters NS in ED (had received 1 liter with EMS), blood and urine cultures collected, and empiric vancomycin and Zosyn initiated  - Lactate and BP improved with IVF, tachycardia resolved  - Check sputum culture and strep pneumo antigen, trend procalcitonin, continue empiric antibiotics with vancomycin and cefepime   2. Acute kidney injury superimposed on CKD III  - SCr is 1.84 on admission,  up from 1.14 the day before  - Likely a prerenal azotemia in setting of hypotension; ATN possible  - She has been fluid-resuscitated prior to admission  - Check urine chemistries, renally-dose medications, hold ARB, repeat chem panel in am    3. Paroxysmal SVT  - Patient was in SVT with rates 160 on arrival and has hx of same  - Possibly precipitated by the infection or agitation, resolved in ED after IVF and Haldol  - Continue cardiac monitoring, resume metoprolol as BP allows    4. Agitation  - Patient is agitated in ED and required Haldol and mittens after she was unable to be redirected  - Treat pain, redirect, reassure, bedside sitter requested   5. Hypotension; hx of ypertension  - SBP was in 70's at SNF, reportedly 60 with EMS, and then 81 in ED after receiving 1 liter NS in ED  - She was in SVT at the time with rates in 150's-160's which could be the etiology, but could also be secondary to sepsis given CXR findings concerning for PNA and UA suggestive of infection  - She has received 2.5 liters of NS by time of admission and BP has improved  - Continue to hold ARB and beta-blocker, resume as tolerated    6.  Right leg injury  - Patient fell at the SNF 8/1, had brisk bleeding from lower right leg wound, was seen in ED with radiographs negative for fracture, H&H was normal, and hemostasis was achieved  - She has large hematoma on admission with soft compartments, no active bleeding, will monitor    7. Acute on chronic diastolic CHF  - Patient had preserved EF with grade 1 diastolic dysfunction on echo in April and takes a daily Lasix 20 mg at the SNF  - She was hypotensive with EMS and given 1 liter NS prior to arrival in ED  - She continued to have low BP in ED and received an additional 1.5 liters  - CXR concerning for mild diffuse edema  - Lasix held on admission in light of hypotension, will follow daily wt and I/O's, resume beta-blocker and diurese when BP allows   8. Hyperkalemia  - Serum potassium is 5.6 on admission in setting of AKI and potassium supplement use  - She was given 1 liter IVF prior to arrival and another 1.5 liters in ED  - Continue cardiac monitoring, hold potassium supplement, repeat a potassium level overnight    DVT prophylaxis: SCD's Code Status: DNR  Family Communication: Discussed with patient  Consults called: None Admission status: Inpatient    Briscoe Deutscher, MD [facility] Pager [phone]  If 7PM-7AM, please contact night-coverage [email or web address] Password [facility]  01/30/2018, 10:25 PM

## 2018-01-30 NOTE — ED Triage Notes (Signed)
[facility] and was found very lethargic and was not responding to workers. EMS STATED her pressures were 60/30 at arrival and was given 1000 liters and pressures became 103/60. Pt has been very combative and talking gibberish.  cbg 130's

## 2018-01-30 NOTE — ED Notes (Signed)
Writer notified EDP Floyd of abnormal I stat lactic result.

## 2018-01-30 NOTE — ED Notes (Signed)
ED Provider at bedside. 

## 2018-01-30 NOTE — ED Notes (Signed)
Pt's brother at bedside.

## 2018-01-31 DIAGNOSIS — I5033 Acute on chronic diastolic (congestive) heart failure: Secondary | ICD-10-CM

## 2018-01-31 DIAGNOSIS — I471 Supraventricular tachycardia: Secondary | ICD-10-CM

## 2018-01-31 DIAGNOSIS — S8991XD Unspecified injury of right lower leg, subsequent encounter: Secondary | ICD-10-CM

## 2018-01-31 DIAGNOSIS — A419 Sepsis, unspecified organism: Principal | ICD-10-CM

## 2018-01-31 DIAGNOSIS — I10 Essential (primary) hypertension: Secondary | ICD-10-CM

## 2018-01-31 DIAGNOSIS — N183 Chronic kidney disease, stage 3 (moderate): Secondary | ICD-10-CM

## 2018-01-31 DIAGNOSIS — J189 Pneumonia, unspecified organism: Secondary | ICD-10-CM

## 2018-01-31 DIAGNOSIS — N179 Acute kidney failure, unspecified: Secondary | ICD-10-CM

## 2018-01-31 LAB — BLOOD CULTURE ID PANEL (REFLEXED)
Acinetobacter baumannii: NOT DETECTED
CANDIDA GLABRATA: NOT DETECTED
CANDIDA PARAPSILOSIS: NOT DETECTED
Candida albicans: NOT DETECTED
Candida krusei: NOT DETECTED
Candida tropicalis: NOT DETECTED
ENTEROBACTER CLOACAE COMPLEX: NOT DETECTED
Enterobacteriaceae species: NOT DETECTED
Enterococcus species: NOT DETECTED
Escherichia coli: NOT DETECTED
Haemophilus influenzae: NOT DETECTED
KLEBSIELLA OXYTOCA: NOT DETECTED
Klebsiella pneumoniae: NOT DETECTED
Listeria monocytogenes: NOT DETECTED
Methicillin resistance: NOT DETECTED
Neisseria meningitidis: NOT DETECTED
PROTEUS SPECIES: NOT DETECTED
PSEUDOMONAS AERUGINOSA: NOT DETECTED
SERRATIA MARCESCENS: NOT DETECTED
STAPHYLOCOCCUS AUREUS BCID: NOT DETECTED
STAPHYLOCOCCUS SPECIES: DETECTED — AB
STREPTOCOCCUS AGALACTIAE: NOT DETECTED
STREPTOCOCCUS PNEUMONIAE: NOT DETECTED
Streptococcus pyogenes: NOT DETECTED
Streptococcus species: NOT DETECTED

## 2018-01-31 LAB — CREATININE, URINE, RANDOM: CREATININE, URINE: 210.44 mg/dL

## 2018-01-31 LAB — URINE CULTURE

## 2018-01-31 LAB — STREP PNEUMONIAE URINARY ANTIGEN: STREP PNEUMO URINARY ANTIGEN: NEGATIVE

## 2018-01-31 LAB — T4: T4 TOTAL: 6.2 ug/dL (ref 4.5–12.0)

## 2018-01-31 LAB — SODIUM, URINE, RANDOM: Sodium, Ur: 42 mmol/L

## 2018-01-31 LAB — GLUCOSE, CAPILLARY: GLUCOSE-CAPILLARY: 90 mg/dL (ref 70–99)

## 2018-01-31 MED ORDER — SODIUM CHLORIDE 0.9 % IV SOLN
1.0000 g | Freq: Every day | INTRAVENOUS | Status: DC
  Administered 2018-01-31 – 2018-02-01 (×2): 1 g via INTRAVENOUS
  Filled 2018-01-31 (×3): qty 10

## 2018-01-31 MED ORDER — DOXYCYCLINE HYCLATE 100 MG PO TABS
100.0000 mg | ORAL_TABLET | Freq: Two times a day (BID) | ORAL | Status: DC
  Administered 2018-01-31 – 2018-02-02 (×5): 100 mg via ORAL
  Filled 2018-01-31 (×5): qty 1

## 2018-01-31 NOTE — Plan of Care (Signed)
  Problem: Pain Managment: Goal: General experience of comfort will improve Outcome: Completed/Met

## 2018-01-31 NOTE — Progress Notes (Signed)
Per previous shift, patient has been sun downing @ approximately 1745. When this writer came on , patient was constantly attempting to get out of bed. This Clinical research associatewriter stayed with patient and the previous RN gave Haldol as ordered with little to no effect. This Clinical research associatewriter notified  Dr Antionette Charpyd who renewed the sitter order. Was later informed by Clydie BraunKaren, charge nurse that the sitter we expected will be going to another case. Charge nurse was then informed that this Clinical research associatewriter has other obligations with her other patients and has to leave, but afraid that leaving her alone will cause a fall. This Clinical research associatewriter suggested having one of the NT sit with patient.

## 2018-01-31 NOTE — Progress Notes (Signed)
PROGRESS NOTE    Madeline Parrish  NWG:[id number] DOB: 1921/12/20 DOA: 01/30/2018 PCP: Iva Boop, MD      Brief Narrative:  Madeline Parrish is a 84 y.o. F with dementia, CKD III, HTN, pSVT who presents with hypotension and tachycardia from SNF.  Patient had fallen at her facility yesterday, suffered hemaotoma to right leg, was evaluated in the ER, was discharged back to SNF with Keflex for suspected UTI.    On day of admission, noted to be tachcyardic with low BP and sluggish, sent to ER again.  In ER, BP 81/60.  EKG showed regular narrow complex tachycardia.  CXR suggested LLL pneumonia with superimposed edema.  Lactic acid 4.68.  Was given 30 cc/kg fluids, empiric antibiotics, admitted for sepsis.     Assessment & Plan:  Sepsis unclear source Suspected source pneumonia?  UTI?  BP good today.  Lactate cleared.  Strep pneumo negative. -Follow blood and urine cultures -Narrow to CTX, doxycycline - Obtain sputum culture if able  Tachycardia Has a history of a paroxysmal SVT, AVNRT type.  This resolved overnight with fluids.  Her blood pressures improved.  TSH minimally elevated, not significant. - Continue metoprolol  Delirium Given Haldol last night. Delirium precautions:   -Lights and TV off, minimize interruptions at night  -Blinds open and lights on during day  -Glasses/hearing aid with patient  -Frequent reorientation  -PT/OT when able  -Avoid sedation medications/Beers list medications   Chronic kidney disease stage III, superimposed acute kidney injury Baseline creatinine 1.1, currently 1.8.  Fina 0.3. -Hold nephrotoxins -IV fluids  Hypertension Hypotensive -Hold home antihypertensives  Chronic diastolic CHF EF 60 to 65%, grade 1 diastolic dysfunction in April.  Was given a lot of fluids in the ER.   -Hold home Lasix -Monitor respiratory status -I/Os, daily weights  Hyperkalemia, resolved  Right leg hematoma -Nursing cares    DVT prophylaxis: SCDs to  left leg Code Status: DO NOT RESUSCITATE Family Communication: None present MDM and disposition Plan: The below labs and imaging reports were reviewed and summarized above.    The patient was admitted with hypotension from SVT and sepsis. Complicated by acute delirium (at baseline has memory loss but is reorientable, here has been confused, agitated and combative). FLuid resuscitated, and now BP normalized.  Cultures pending, will need continued empiric IV antibiotics, next will need PT eval.   Consultants:   Noen  Procedures:   None  Antimicrobials:   Vancomycin x1  Zosyn x1  Ceftriaxone 8/3 >>  Doxy 8.3 >>   Subjective: No fever overnight, blood pressure normalized.  Agitated overnight.  No vomiting, diarrhea, hematuria.  I discussed the case with nursing.  Objective: Vitals:   01/30/18 2337 01/31/18 0510 01/31/18 0651 01/31/18 1222  BP: (!) 153/90 109/73  139/64  Pulse: 66 72  (!) 58  Resp: 15 15  20   Temp:   (!) 97.3 F (36.3 C) (!) 97.3 F (36.3 C)  TempSrc:   Oral Oral  SpO2: 99% 99%  100%  Weight:  49.4 kg (109 lb)      Intake/Output Summary (Last 24 hours) at 01/31/2018 1340 Last data filed at 01/31/2018 0947 Gross per 24 hour  Intake 1930 ml  Output -  Net 1930 ml   Filed Weights   01/31/18 0510  Weight: 49.4 kg (109 lb)    Examination: General appearance: Thin elderly female, lying in bed, mitts on hands, being fed breakfast.  Interactive. HEENT: Anicteric, conjunctive pink, lids and lashes normal,  no oral lesions, oral mucosa moist, hearing normal Skin: Warm and dry.  no jaundice.  No suspicious rashes or lesions. Cardiac: Regular rate and rhythm, normal S1 and S2, no murmurs appreciated.  Capillary refill brisk.  No lower extremity edema.  Radial pulses 2+ and symmetric.    Respiratory: Tachypneic, shallow.  Lungs with crackles in bilateral bases. Abdomen: Abdomen soft without tenderness to palpation, no ascites, distention, hepatospleno  megaly MSK: No deformities or effusions. Neuro: Awake and alert.  EOMI, moves all extremities with global but symmetric weakness. Speech fluent.    Psych: Upon any questions, attention diminished. Affect normal.  Judgment and insight appear impaired by dementia.    Data Reviewed: I have personally reviewed following labs and imaging studies:  CBC: Recent Labs  Lab 01/29/18 0952 01/30/18 2009  WBC 10.3 11.1*  NEUTROABS 8.6* 8.0*  HGB 13.5 12.0  HCT 41.3 38.3  MCV 95.6 98.5  PLT 242 241   Basic Metabolic Panel: Recent Labs  Lab 01/29/18 0952 01/30/18 1724  NA 143 142  K 3.5 5.6*  CL 108 109  CO2 27 18*  GLUCOSE 107* 115*  BUN 17 29*  CREATININE 1.14* 1.84*  CALCIUM 8.7* 8.4*   GFR: Estimated Creatinine Clearance: 14.6 mL/min (A) (by C-G formula based on SCr of 1.84 mg/dL (H)). Liver Function Tests: Recent Labs  Lab 01/29/18 0952 01/30/18 1724  AST 17 43*  ALT 12 15  ALKPHOS 51 55  BILITOT 0.8 1.8*  PROT 5.6* 5.1*  ALBUMIN 3.0* 3.0*   No results for input(s): LIPASE, AMYLASE in the last 168 hours. No results for input(s): AMMONIA in the last 168 hours. Coagulation Profile: No results for input(s): INR, PROTIME in the last 168 hours. Cardiac Enzymes: Recent Labs  Lab 01/29/18 0952 01/30/18 1724  CKTOTAL  --  176  TROPONINI <0.03  --    BNP (last 3 results) No results for input(s): PROBNP in the last 8760 hours. HbA1C: No results for input(s): HGBA1C in the last 72 hours. CBG: Recent Labs  Lab 01/30/18 2330 01/31/18 0809  GLUCAP 96 90   Lipid Profile: No results for input(s): CHOL, HDL, LDLCALC, TRIG, CHOLHDL, LDLDIRECT in the last 72 hours. Thyroid Function Tests: Recent Labs    01/30/18 1841  TSH 4.553*  T4TOTAL 6.2   Anemia Panel: No results for input(s): VITAMINB12, FOLATE, FERRITIN, TIBC, IRON, RETICCTPCT in the last 72 hours. Urine analysis:    Component Value Date/Time   COLORURINE YELLOW 01/30/2018 1931   APPEARANCEUR CLOUDY  (A) 01/30/2018 1931   LABSPEC 1.018 01/30/2018 1931   PHURINE 5.0 01/30/2018 1931   GLUCOSEU 50 (A) 01/30/2018 1931   HGBUR SMALL (A) 01/30/2018 1931   BILIRUBINUR NEGATIVE 01/30/2018 1931   KETONESUR NEGATIVE 01/30/2018 1931   PROTEINUR 100 (A) 01/30/2018 1931   NITRITE NEGATIVE 01/30/2018 1931   LEUKOCYTESUR LARGE (A) 01/30/2018 1931   Sepsis Labs: @LABRCNTIP (procalcitonin:4,lacticacidven:4)  ) Recent Results (from the past 240 hour(s))  Blood Culture (routine x 2)     Status: None (Preliminary result)   Collection Time: 01/30/18  5:24 PM  Result Value Ref Range Status   Specimen Description BLOOD LEFT HAND  Final   Special Requests   Final    BOTTLES DRAWN AEROBIC AND ANAEROBIC Blood Culture results may not be optimal due to an inadequate volume of blood received in culture bottles   Culture   Final    NO GROWTH < 24 HOURS Performed at [facility], [address], [place], [place] [address]    Report Status PENDING  Incomplete  Blood Culture (routine x 2)     Status: None (Preliminary result)   Collection Time: 01/30/18  5:43 PM  Result Value Ref Range Status   Specimen Description BLOOD RIGHT FOREARM  Final   Special Requests   Final    BOTTLES DRAWN AEROBIC AND ANAEROBIC Blood Culture adequate volume   Culture   Final    NO GROWTH < 24 HOURS Performed at [facility], [address], [place], [place] [address]    Report Status PENDING  Incomplete         Radiology Studies: Dg Chest Port 1 View  Result Date: 01/30/2018 CLINICAL DATA:  84 year old found lethargic and unresponsive at the nursing home. Hypotensive upon EMS arrival (60/30). EXAM: PORTABLE CHEST 1 VIEW COMPARISON:  01/29/2018, 09/29/2017. FINDINGS: Cardiac silhouette moderately to markedly enlarged. Thoracic aorta tortuous and atherosclerotic. Interval development of mild diffuse interstitial pulmonary edema. More focal patchy airspace opacities at the LEFT lung base. No focal airspace  opacities elsewhere. IMPRESSION: 1. Stable moderate to marked cardiomegaly. Mild diffuse interstitial pulmonary edema indicates mild CHF and/or fluid overload. 2. Focal airspace opacities at the LEFT lung base, query superimposed pneumonia. Electronically Signed   By: Hulan Saas M.D.   On: 01/30/2018 18:04        Scheduled Meds: . doxycycline  100 mg Oral Q12H  . metoprolol succinate  25 mg Oral Daily  . multivitamin with minerals  1 tablet Oral Daily  . sodium chloride flush  3 mL Intravenous Q12H  . sodium chloride flush  3 mL Intravenous Q12H   Continuous Infusions: . sodium chloride 250 mL (01/31/18 0010)  . cefTRIAXone (ROCEPHIN)  IV       LOS: 1 day    Time spent: 25 minutes    Alberteen Sam, MD [facility] 01/31/2018, 1:40 PM     Pager [phone] --- please page though AMION:  [email or web address] Password [email or web address] If 7PM-7AM, please contact night-coverage

## 2018-02-01 DIAGNOSIS — R451 Restlessness and agitation: Secondary | ICD-10-CM

## 2018-02-01 LAB — CBC
HEMATOCRIT: 39.4 % (ref 36.0–46.0)
Hemoglobin: 12.8 g/dL (ref 12.0–15.0)
MCH: 31 pg (ref 26.0–34.0)
MCHC: 32.5 g/dL (ref 30.0–36.0)
MCV: 95.4 fL (ref 78.0–100.0)
Platelets: 230 10*3/uL (ref 150–400)
RBC: 4.13 MIL/uL (ref 3.87–5.11)
RDW: 13.9 % (ref 11.5–15.5)
WBC: 8.3 10*3/uL (ref 4.0–10.5)

## 2018-02-01 LAB — BASIC METABOLIC PANEL
Anion gap: 9 (ref 5–15)
BUN: 16 mg/dL (ref 8–23)
CHLORIDE: 109 mmol/L (ref 98–111)
CO2: 25 mmol/L (ref 22–32)
Calcium: 8.6 mg/dL — ABNORMAL LOW (ref 8.9–10.3)
Creatinine, Ser: 0.96 mg/dL (ref 0.44–1.00)
GFR calc Af Amer: 57 mL/min — ABNORMAL LOW (ref >60)
GFR calc non Af Amer: 49 mL/min — ABNORMAL LOW (ref >60)
GLUCOSE: 99 mg/dL (ref 70–99)
POTASSIUM: 3.2 mmol/L — AB (ref 3.5–5.1)
Sodium: 143 mmol/L (ref 135–145)

## 2018-02-01 LAB — GLUCOSE, CAPILLARY: Glucose-Capillary: 82 mg/dL (ref 70–99)

## 2018-02-01 LAB — PROCALCITONIN: Procalcitonin: 0.16 ng/mL

## 2018-02-01 LAB — UREA NITROGEN, URINE: UREA NITROGEN UR: 403 mg/dL

## 2018-02-01 MED ORDER — HALOPERIDOL 2 MG PO TABS
2.0000 mg | ORAL_TABLET | Freq: Every evening | ORAL | Status: DC | PRN
  Administered 2018-02-01: 2 mg via ORAL
  Filled 2018-02-01 (×3): qty 1

## 2018-02-01 NOTE — Evaluation (Signed)
Physical Therapy Evaluation Patient Details Name: Madeline Parrish MRN: [id number] DOB: 1922/04/12 Today's Date: 02/01/2018   History of Present Illness  Madeline Parrish is a 84 y.o. female with medical history significant for chronic kidney disease stage III, hypertension, and paroxysmal SVT, now presenting from her nursing facility for evaluation of hypotension and tachycardia.  Patient had fallen at the SNF, suffered a injury to her lower right leg with significant bleeding, and was evaluated in the ED for this yesterday.  Hemostasis was achieved, radiographs were negative for fracture, hemoglobin remained normal, and she was discharged back to SNF with Keflex for suspected UTI.  Today, she has been tachycardic up into the 150s and 160s with SBP in the 70s.  Clinical Impression  Pt very confused with decreased mobility compared to PTA. Pt requires 24/7 assist and modA for all mobility. Pt remains appropriate to return to SNF once medically appropriate.    Follow Up Recommendations SNF;Supervision/Assistance - 24 hour    Equipment Recommendations  None recommended by PT    Recommendations for Other Services       Precautions / Restrictions Precautions Precautions: Fall Precaution Comments: confusion Restrictions Weight Bearing Restrictions: No      Mobility  Bed Mobility Overal bed mobility: Needs Assistance Bed Mobility: Supine to Sit     Supine to sit: Mod assist     General bed mobility comments: pt brought LEs off EOB, modA for trunk elevation  Transfers Overall transfer level: Needs assistance Equipment used: Rolling walker (2 wheeled) Transfers: Sit to/from Stand Sit to Stand: Min assist;Mod assist         General transfer comment: modA to power up, minA once in standing, max directional verbal cues and hand placement  Ambulation/Gait Ambulation/Gait assistance: Min assist;Mod assist Gait Distance (Feet): 10 Feet(around bed) Assistive device: Rolling  walker (2 wheeled) Gait Pattern/deviations: Step-through pattern;Decreased stride length;Trunk flexed;Shuffle;Narrow base of support Gait velocity: slow Gait velocity interpretation: <1.8 ft/sec, indicate of risk for recurrent falls General Gait Details: max directional step by step verbal cues, minA to cont forward movment of walker, modA for turning, short shuffled steps  Stairs            Wheelchair Mobility    Modified Rankin (Stroke Patients Only)       Balance Overall balance assessment: History of Falls(very unsteady)                                           Pertinent Vitals/Pain Pain Assessment: No/denies pain    Home Living Family/patient expects to be discharged to:: Skilled nursing facility                 Additional Comments: pt from [facility], brother lives in area    Prior Function Level of Independence: Needs assistance   Gait / Transfers Assistance Needed: usese walker  ADL's / Homemaking Assistance Needed: assist from SNF staff  Comments: pt poor historian, states she lives with her mom     Hand Dominance   Dominant Hand: Right    Extremity/Trunk Assessment   Upper Extremity Assessment Upper Extremity Assessment: Generalized weakness    Lower Extremity Assessment Lower Extremity Assessment: Generalized weakness    Cervical / Trunk Assessment Cervical / Trunk Assessment: Kyphotic  Communication   Communication: No difficulties  Cognition Arousal/Alertness: Awake/alert Behavior During Therapy: WFL for tasks assessed/performed Overall Cognitive Status:  Impaired/Different from baseline Area of Impairment: Orientation;Memory;Following commands;Safety/judgement;Awareness;Problem solving                 Orientation Level: Disoriented to;Place;Time;Situation(states at [facility] and she lives with her mother)   Memory: Decreased short-term memory Following Commands: Follows one step commands with increased  time Safety/Judgement: Decreased awareness of safety;Decreased awareness of deficits Awareness: Intellectual(pt saturated in urine in the bed, pt with no idea) Problem Solving: Slow processing;Difficulty sequencing;Requires verbal cues        General Comments General comments (skin integrity, edema, etc.): pt with large bruise and blister on lower L leg from previous fall, pt found soaked in urine. Pt dependent for hygiene. Pt stood x 30 sec x 3 times for pericare    Exercises     Assessment/Plan    PT Assessment Patient needs continued PT services  PT Problem List Decreased strength;Decreased range of motion;Decreased activity tolerance;Decreased balance;Decreased mobility;Decreased coordination;Decreased cognition;Decreased knowledge of use of DME;Decreased safety awareness;Decreased knowledge of precautions       PT Treatment Interventions DME instruction;Gait training;Stair training;Functional mobility training;Therapeutic activities;Therapeutic exercise;Balance training    PT Goals (Current goals can be found in the Care Plan section)  Acute Rehab PT Goals Patient Stated Goal: didn't state PT Goal Formulation: Patient unable to participate in goal setting Time For Goal Achievement: 02/15/18 Potential to Achieve Goals: Good    Frequency Min 2X/week   Barriers to discharge        Co-evaluation               AM-PAC PT "6 Clicks" Daily Activity  Outcome Measure Difficulty turning over in bed (including adjusting bedclothes, sheets and blankets)?: Unable Difficulty moving from lying on back to sitting on the side of the bed? : Unable Difficulty sitting down on and standing up from a chair with arms (e.g., wheelchair, bedside commode, etc,.)?: Unable Help needed moving to and from a bed to chair (including a wheelchair)?: A Lot Help needed walking in hospital room?: A Lot Help needed climbing 3-5 steps with a railing? : A Lot 6 Click Score: 9    End of Session  Equipment Utilized During Treatment: Gait belt Activity Tolerance: Patient tolerated treatment well Patient left: in chair;with call bell/phone within reach;with chair alarm set Nurse Communication: Mobility status PT Visit Diagnosis: Unsteadiness on feet (R26.81);History of falling (Z91.81)    Time: 2956-2130 PT Time Calculation (min) (ACUTE ONLY): 21 min   Charges:   PT Evaluation $PT Eval Moderate Complexity: 1 Mod          Lewis Shock, PT, DPT Pager #: [phone] Office #: [phone]   Madeline Parrish 02/01/2018, 2:20 PM

## 2018-02-01 NOTE — Progress Notes (Signed)
PROGRESS NOTE    Madeline Parrish  ZOX:[id number] DOB: 1922-04-02 DOA: 01/30/2018 PCP: Iva Boop, MD      Brief Narrative:  Madeline Parrish is a 84 y.o. F with dementia, CKD III, HTN, pSVT who presents with hypotension and tachycardia from SNF.  Patient had fallen at her facility yesterday, suffered hemaotoma to right leg, was evaluated in the ER, was discharged back to SNF with Keflex for suspected UTI.    On day of admission, noted to be tachcyardic with low BP and sluggish, sent to ER again.  In ER, BP 81/60.  EKG showed regular narrow complex tachycardia.  CXR suggested LLL pneumonia with superimposed edema.  Lactic acid 4.68.  Was given 30 cc/kg fluids, empiric antibiotics, admitted for sepsis.     Assessment & Plan:  Sepsis unclear source Suspected source pneumonia?  UTI? Given fluids, lactic acid normalized.   Bp remains stable.  Strep pneumo negative.  Urine insignificant growth.  1/2 blood cultures CoNS/contaminant. -Follow blood and urine cultures -Continue CTX, doxycycline -Obtain sputum culture if able   1/2 blood cultures with CoNS Likely contaminant  History of SVT Has a history of a paroxysmal SVT, AVNRT type.  This resolved overnight first hospital day with fluids.  Her blood pressures improved.  TSH minimally elevated, not significant. -Continue metoprolol  Delirium Sundowning at night. -Haldol PRN at night Delirium precautions:   -Lights and TV off, minimize interruptions at night  -Blinds open and lights on during day  -Glasses/hearing aid with patient  -Frequent reorientation  -PT/OT when able  -Avoid sedation medications/Beers list medications   Chronic kidney disease stage III, superimposed acute kidney injury Baseline creatinine 1.1, bumped to 1.8, now resolved.  Fina 0.3. -Hold nephrotoxins -Finish IV fluids  Hypertension BP very labile. -Hold home antihypertensives for now  Chronic diastolic CHF EF 60 to 65%, grade 1 diastolic dysfunction  in April.  Was given a lot of fluids in the ER.   -Hold Lasix -Monitor respiratory status -I/Os, daily weights  Hyperkalemia, resolved  Right leg hematoma -Nursing cares    DVT prophylaxis: SCDs to left leg Code Status: DO NOT RESUSCITATE Family Communication: None present MDM and disposition Plan: The below labs and imaging reports were reviewed and summarized above.  Medication adjustments as above.  The patient was admitted with hypotension from sepsis and SVT.  This is complicated now by acute delirium.  She has been fluid resuscitated her BP is now normal and labile.  It appears that she has a pneumonia, we are treating with empiric ceftriaxone and doxycycline, waiting for PT evaluation and placement.  Telemetry is unremarkable, patient has been stable, will be trasnferred to Med surg status.    Consultants:   Noen  Procedures:   None  Antimicrobials:   Vancomycin x1  Zosyn x1  Ceftriaxone 8/3 >>  Doxy 8.3 >>   Subjective: No new fever overnight, blood pressure good, no vomiting, diarrhea, hematuria.  I discussed with the case with nursing, they reported no respiratory distress.  They did note agitation overnight again.  Objective: Vitals:   01/31/18 1222 01/31/18 2205 02/01/18 0500 02/01/18 1018  BP: 139/64 (!) 172/87 101/82 (!) 169/92  Pulse: (!) 58 81 81 83  Resp: 20 15 18    Temp: (!) 97.3 F (36.3 C) 98 F (36.7 C) 98.2 F (36.8 C)   TempSrc: Oral Oral Oral   SpO2: 100% 97% 97%   Weight:        Intake/Output Summary (Last 24 hours)  at 02/01/2018 1100 Last data filed at 02/01/2018 [id number] Gross per 24 hour  Intake 864.39 ml  Output 350 ml  Net 514.39 ml   Filed Weights   01/31/18 0510  Weight: 49.4 kg (109 lb)    Examination: General appearance: Elderly female, lying in bed, interactive.  HEENT: Anicteric, conjunctive pink, lids and lashes normal, no oral lesions, oral mucosa moist, hearing normal Skin: Warm and dry.  no jaundice.  No  suspicious rashes or lesions. Cardiac: RRR, no murmurs, no lower extremity edema Respiratory: Respiratory rate normal, effort normal, lungs with crackles in bilateral bases. Abdomen: Abdomen soft without tenderness to palpation, no hepatosplenomegaly MSK: Hematoma in the left leg. Neuro: Awake and alert, not oriented to place, situation, time.  Extraocular movements intact, moves upper extremities with normal strength and symmetric, speech fluent. Psych: Responding to questions, attention diminished, affect normal, not oriented to place or situation, judgment and insight impaired severely impaired by dementia.    Data Reviewed: I have personally reviewed following labs and imaging studies:  CBC: Recent Labs  Lab 01/29/18 0952 01/30/18 2009 02/01/18 0846  WBC 10.3 11.1* 8.3  NEUTROABS 8.6* 8.0*  --   HGB 13.5 12.0 12.8  HCT 41.3 38.3 39.4  MCV 95.6 98.5 95.4  PLT 242 241 230   Basic Metabolic Panel: Recent Labs  Lab 01/29/18 0952 01/30/18 1724 02/01/18 0846  NA 143 142 143  K 3.5 5.6* 3.2*  CL 108 109 109  CO2 27 18* 25  GLUCOSE 107* 115* 99  BUN 17 29* 16  CREATININE 1.14* 1.84* 0.96  CALCIUM 8.7* 8.4* 8.6*   GFR: Estimated Creatinine Clearance: 27.9 mL/min (by C-G formula based on SCr of 0.96 mg/dL). Liver Function Tests: Recent Labs  Lab 01/29/18 0952 01/30/18 1724  AST 17 43*  ALT 12 15  ALKPHOS 51 55  BILITOT 0.8 1.8*  PROT 5.6* 5.1*  ALBUMIN 3.0* 3.0*   No results for input(s): LIPASE, AMYLASE in the last 168 hours. No results for input(s): AMMONIA in the last 168 hours. Coagulation Profile: No results for input(s): INR, PROTIME in the last 168 hours. Cardiac Enzymes: Recent Labs  Lab 01/29/18 0952 01/30/18 1724  CKTOTAL  --  176  TROPONINI <0.03  --    BNP (last 3 results) No results for input(s): PROBNP in the last 8760 hours. HbA1C: No results for input(s): HGBA1C in the last 72 hours. CBG: Recent Labs  Lab 01/30/18 2330 01/31/18 0809  02/01/18 0737  GLUCAP 96 90 82   Lipid Profile: No results for input(s): CHOL, HDL, LDLCALC, TRIG, CHOLHDL, LDLDIRECT in the last 72 hours. Thyroid Function Tests: Recent Labs    01/30/18 1841  TSH 4.553*  T4TOTAL 6.2   Anemia Panel: No results for input(s): VITAMINB12, FOLATE, FERRITIN, TIBC, IRON, RETICCTPCT in the last 72 hours. Urine analysis:    Component Value Date/Time   COLORURINE YELLOW 01/30/2018 1931   APPEARANCEUR CLOUDY (A) 01/30/2018 1931   LABSPEC 1.018 01/30/2018 1931   PHURINE 5.0 01/30/2018 1931   GLUCOSEU 50 (A) 01/30/2018 1931   HGBUR SMALL (A) 01/30/2018 1931   BILIRUBINUR NEGATIVE 01/30/2018 1931   KETONESUR NEGATIVE 01/30/2018 1931   PROTEINUR 100 (A) 01/30/2018 1931   NITRITE NEGATIVE 01/30/2018 1931   LEUKOCYTESUR LARGE (A) 01/30/2018 1931   Sepsis Labs: @LABRCNTIP (procalcitonin:4,lacticacidven:4)  ) Recent Results (from the past 240 hour(s))  Blood Culture (routine x 2)     Status: Abnormal (Preliminary result)   Collection Time: 01/30/18  5:24 PM  Result Value Ref Range Status   Specimen Description BLOOD LEFT HAND  Final   Special Requests   Final    BOTTLES DRAWN AEROBIC AND ANAEROBIC Blood Culture results may not be optimal due to an inadequate volume of blood received in culture bottles   Culture  Setup Time   Final    GRAM POSITIVE COCCI IN CLUSTERS AEROBIC BOTTLE ONLY CRITICAL RESULT CALLED TO, READ BACK BY AND VERIFIED WITH: A MASTERS,PHARMD AT 1925 01/31/18 BY L BENFIELD Performed at [facility], [address], [place], [place] [address]    Culture STAPHYLOCOCCUS SPECIES (COAGULASE NEGATIVE) (A)  Final   Report Status PENDING  Incomplete  Blood Culture ID Panel (Reflexed)     Status: Abnormal   Collection Time: 01/30/18  5:24 PM  Result Value Ref Range Status   Enterococcus species NOT DETECTED NOT DETECTED Final   Listeria monocytogenes NOT DETECTED NOT DETECTED Final   Staphylococcus species DETECTED (A) NOT DETECTED  Final    Comment: Methicillin (oxacillin) susceptible coagulase negative staphylococcus. Possible blood culture contaminant (unless isolated from more than one blood culture draw or clinical case suggests pathogenicity). No antibiotic treatment is indicated for blood  culture contaminants. CRITICAL RESULT CALLED TO, READ BACK BY AND VERIFIED WITH: A MASTERS,PHARMD AT 1925 01/31/18 BY L BENFIELD    Staphylococcus aureus NOT DETECTED NOT DETECTED Final   Methicillin resistance NOT DETECTED NOT DETECTED Final   Streptococcus species NOT DETECTED NOT DETECTED Final   Streptococcus agalactiae NOT DETECTED NOT DETECTED Final   Streptococcus pneumoniae NOT DETECTED NOT DETECTED Final   Streptococcus pyogenes NOT DETECTED NOT DETECTED Final   Acinetobacter baumannii NOT DETECTED NOT DETECTED Final   Enterobacteriaceae species NOT DETECTED NOT DETECTED Final   Enterobacter cloacae complex NOT DETECTED NOT DETECTED Final   Escherichia coli NOT DETECTED NOT DETECTED Final   Klebsiella oxytoca NOT DETECTED NOT DETECTED Final   Klebsiella pneumoniae NOT DETECTED NOT DETECTED Final   Proteus species NOT DETECTED NOT DETECTED Final   Serratia marcescens NOT DETECTED NOT DETECTED Final   Haemophilus influenzae NOT DETECTED NOT DETECTED Final   Neisseria meningitidis NOT DETECTED NOT DETECTED Final   Pseudomonas aeruginosa NOT DETECTED NOT DETECTED Final   Candida albicans NOT DETECTED NOT DETECTED Final   Candida glabrata NOT DETECTED NOT DETECTED Final   Candida krusei NOT DETECTED NOT DETECTED Final   Candida parapsilosis NOT DETECTED NOT DETECTED Final   Candida tropicalis NOT DETECTED NOT DETECTED Final    Comment: Performed at [facility], [address], [place], [place] [address]  Blood Culture (routine x 2)     Status: None (Preliminary result)   Collection Time: 01/30/18  5:43 PM  Result Value Ref Range Status   Specimen Description BLOOD RIGHT FOREARM  Final   Special Requests   Final     BOTTLES DRAWN AEROBIC AND ANAEROBIC Blood Culture adequate volume   Culture   Final    NO GROWTH < 24 HOURS Performed at [facility], [address] [place], [place] [address]    Report Status PENDING  Incomplete  Urine culture     Status: Abnormal   Collection Time: 01/30/18  7:31 PM  Result Value Ref Range Status   Specimen Description URINE, RANDOM  Final   Special Requests NONE  Final   Culture (A)  Final    <10,000 COLONIES/mL INSIGNIFICANT GROWTH Performed at [facility], [address] [place], [place] [address]  Report Status 01/31/2018 FINAL  Final         Radiology Studies: Dg Chest Port 1 View  Result Date: 01/30/2018 CLINICAL DATA:  84 year old found lethargic and unresponsive at the nursing home. Hypotensive upon EMS arrival (60/30). EXAM: PORTABLE CHEST 1 VIEW COMPARISON:  01/29/2018, 09/29/2017. FINDINGS: Cardiac silhouette moderately to markedly enlarged. Thoracic aorta tortuous and atherosclerotic. Interval development of mild diffuse interstitial pulmonary edema. More focal patchy airspace opacities at the LEFT lung base. No focal airspace opacities elsewhere. IMPRESSION: 1. Stable moderate to marked cardiomegaly. Mild diffuse interstitial pulmonary edema indicates mild CHF and/or fluid overload. 2. Focal airspace opacities at the LEFT lung base, query superimposed pneumonia. Electronically Signed   By: Hulan Saas M.D.   On: 01/30/2018 18:04        Scheduled Meds: . doxycycline  100 mg Oral Q12H  . metoprolol succinate  25 mg Oral Daily  . multivitamin with minerals  1 tablet Oral Daily  . sodium chloride flush  3 mL Intravenous Q12H  . sodium chloride flush  3 mL Intravenous Q12H   Continuous Infusions: . sodium chloride 250 mL (01/31/18 2331)  . cefTRIAXone (ROCEPHIN)  IV 1 g (01/31/18 2335)     LOS: 2 days    Time spent: 25 minutes    Alberteen Sam, MD [facility] 02/01/2018, 11:00 AM     Pager  [phone] --- please page though AMION:  [email or web address] If 7PM-7AM, please contact night-coverage

## 2018-02-01 NOTE — Progress Notes (Signed)
Patient still awaiting bed allocation to medsurg unit. Madeline Parrish N Nolan Tuazon, RN  

## 2018-02-02 DIAGNOSIS — I5033 Acute on chronic diastolic (congestive) heart failure: Secondary | ICD-10-CM | POA: Diagnosis not present

## 2018-02-02 DIAGNOSIS — S8012XD Contusion of left lower leg, subsequent encounter: Secondary | ICD-10-CM | POA: Diagnosis not present

## 2018-02-02 DIAGNOSIS — L03116 Cellulitis of left lower limb: Secondary | ICD-10-CM | POA: Diagnosis not present

## 2018-02-02 DIAGNOSIS — R410 Disorientation, unspecified: Secondary | ICD-10-CM | POA: Diagnosis not present

## 2018-02-02 DIAGNOSIS — N179 Acute kidney failure, unspecified: Secondary | ICD-10-CM | POA: Diagnosis not present

## 2018-02-02 DIAGNOSIS — R279 Unspecified lack of coordination: Secondary | ICD-10-CM | POA: Diagnosis not present

## 2018-02-02 DIAGNOSIS — I129 Hypertensive chronic kidney disease with stage 1 through stage 4 chronic kidney disease, or unspecified chronic kidney disease: Secondary | ICD-10-CM | POA: Diagnosis not present

## 2018-02-02 DIAGNOSIS — I5032 Chronic diastolic (congestive) heart failure: Secondary | ICD-10-CM | POA: Diagnosis not present

## 2018-02-02 DIAGNOSIS — J189 Pneumonia, unspecified organism: Secondary | ICD-10-CM | POA: Diagnosis not present

## 2018-02-02 DIAGNOSIS — A419 Sepsis, unspecified organism: Secondary | ICD-10-CM | POA: Diagnosis not present

## 2018-02-02 DIAGNOSIS — S8012XA Contusion of left lower leg, initial encounter: Secondary | ICD-10-CM | POA: Diagnosis not present

## 2018-02-02 DIAGNOSIS — Z743 Need for continuous supervision: Secondary | ICD-10-CM | POA: Diagnosis not present

## 2018-02-02 DIAGNOSIS — J181 Lobar pneumonia, unspecified organism: Secondary | ICD-10-CM | POA: Diagnosis not present

## 2018-02-02 DIAGNOSIS — T8189XD Other complications of procedures, not elsewhere classified, subsequent encounter: Secondary | ICD-10-CM | POA: Diagnosis not present

## 2018-02-02 DIAGNOSIS — I10 Essential (primary) hypertension: Secondary | ICD-10-CM | POA: Diagnosis not present

## 2018-02-02 DIAGNOSIS — M6281 Muscle weakness (generalized): Secondary | ICD-10-CM | POA: Diagnosis not present

## 2018-02-02 DIAGNOSIS — N183 Chronic kidney disease, stage 3 (moderate): Secondary | ICD-10-CM | POA: Diagnosis not present

## 2018-02-02 DIAGNOSIS — I471 Supraventricular tachycardia: Secondary | ICD-10-CM | POA: Diagnosis not present

## 2018-02-02 DIAGNOSIS — Z0189 Encounter for other specified special examinations: Secondary | ICD-10-CM | POA: Diagnosis not present

## 2018-02-02 DIAGNOSIS — I509 Heart failure, unspecified: Secondary | ICD-10-CM | POA: Diagnosis not present

## 2018-02-02 DIAGNOSIS — S8991XD Unspecified injury of right lower leg, subsequent encounter: Secondary | ICD-10-CM | POA: Diagnosis not present

## 2018-02-02 DIAGNOSIS — Z9181 History of falling: Secondary | ICD-10-CM | POA: Diagnosis not present

## 2018-02-02 DIAGNOSIS — M6282 Rhabdomyolysis: Secondary | ICD-10-CM | POA: Diagnosis not present

## 2018-02-02 DIAGNOSIS — R451 Restlessness and agitation: Secondary | ICD-10-CM | POA: Diagnosis not present

## 2018-02-02 DIAGNOSIS — T8189XA Other complications of procedures, not elsewhere classified, initial encounter: Secondary | ICD-10-CM | POA: Diagnosis not present

## 2018-02-02 DIAGNOSIS — R2681 Unsteadiness on feet: Secondary | ICD-10-CM | POA: Diagnosis not present

## 2018-02-02 LAB — BASIC METABOLIC PANEL
ANION GAP: 9 (ref 5–15)
BUN: 11 mg/dL (ref 8–23)
CHLORIDE: 109 mmol/L (ref 98–111)
CO2: 27 mmol/L (ref 22–32)
Calcium: 8.6 mg/dL — ABNORMAL LOW (ref 8.9–10.3)
Creatinine, Ser: 0.94 mg/dL (ref 0.44–1.00)
GFR calc Af Amer: 58 mL/min — ABNORMAL LOW (ref >60)
GFR, EST NON AFRICAN AMERICAN: 50 mL/min — AB (ref >60)
Glucose, Bld: 95 mg/dL (ref 70–99)
POTASSIUM: 3.1 mmol/L — AB (ref 3.5–5.1)
Sodium: 145 mmol/L (ref 135–145)

## 2018-02-02 LAB — CBC
HCT: 37.4 % (ref 36.0–46.0)
Hemoglobin: 12.3 g/dL (ref 12.0–15.0)
MCH: 30.8 pg (ref 26.0–34.0)
MCHC: 32.9 g/dL (ref 30.0–36.0)
MCV: 93.5 fL (ref 78.0–100.0)
PLATELETS: 268 10*3/uL (ref 150–400)
RBC: 4 MIL/uL (ref 3.87–5.11)
RDW: 13.7 % (ref 11.5–15.5)
WBC: 8.4 10*3/uL (ref 4.0–10.5)

## 2018-02-02 LAB — LEGIONELLA PNEUMOPHILA SEROGP 1 UR AG: L. pneumophila Serogp 1 Ur Ag: NEGATIVE

## 2018-02-02 LAB — CULTURE, BLOOD (ROUTINE X 2)

## 2018-02-02 LAB — PROCALCITONIN: Procalcitonin: <0.1 ng/mL

## 2018-02-02 MED ORDER — POTASSIUM CHLORIDE CRYS ER 20 MEQ PO TBCR
20.0000 meq | EXTENDED_RELEASE_TABLET | Freq: Two times a day (BID) | ORAL | Status: DC
  Administered 2018-02-02: 20 meq via ORAL
  Filled 2018-02-02: qty 1

## 2018-02-02 MED ORDER — DOXYCYCLINE HYCLATE 100 MG PO TABS: 100.0000 mg | ORAL_TABLET | Freq: Two times a day (BID) | ORAL | 0 refills | Status: AC

## 2018-02-02 MED ORDER — CEFDINIR 300 MG PO CAPS: 300.0000 mg | ORAL_CAPSULE | Freq: Two times a day (BID) | ORAL | 0 refills | Status: AC

## 2018-02-02 NOTE — Progress Notes (Addendum)
1400 Report called to NeodeshaMonet, Charity fundraiserN at [facility]. IV removed without difficulty. Pt stating she doesn't want to wear her "dirty" clothes home. Pt in hospital gown awaiting transport. Bag packed for patient. Belongings included clothing and paper work ONLY, pt wearing glasses.   1500 Pt left via stretcher in stable condition. VSS, see flow sheet. Pt A&Ox1, to name only. Pt denies pain, no complaints at time of discharge.

## 2018-02-02 NOTE — Clinical Social Work Note (Signed)
Patient is a long-term resident at [facility]. No supports at bedside. Left voicemail for her brother.  Charlynn CourtSarah Krisna Omar, CSW [phone]

## 2018-02-02 NOTE — Clinical Social Work Note (Signed)
CSW facilitated patient discharge including contacting patient family (Brother) and facility to confirm patient discharge plans. Clinical information faxed to facility and family agreeable with plan. CSW arranged ambulance transport via [facility] to [facility]. RN to call report prior to discharge [phone]).  CSW will sign off for now as social work intervention is no longer needed. Please consult us again if new needs arise.  Charlynn CourtSarah Hogan Hoobler, CSW [phone]

## 2018-02-02 NOTE — Discharge Summary (Signed)
Physician Discharge Summary  Madeline SchilderMargaret M Parrish ZOX:[id number] DOB: 02-Dec-1921 DOA: 01/30/2018  PCP: Iva BoopVia, Kevin, MD  Admit date: 01/30/2018 Discharge date: 02/02/2018  Admitted From: SNF  Disposition:  SNF   Recommendations for Outpatient Follow-up:  1. Please obtain BMP to re-eval K in 1 week 2. Continue doxycycline and Cefdinir for 4 more days   Home Health: N/A Equipment/Devices: None  Discharge Condition: Fair  CODE STATUS: DO NOT RESUSCITATE Diet recommendation: Regular  Brief/Interim Summary: Madeline Parrish is a 84 y.o. F with dementia, CKD III, HTN, pSVT who presents with hypotension and tachycardia from SNF.  Patient had fallen at her facility yesterday, suffered hemaotoma to right leg, was evaluated in the ER, was discharged back to SNF with Keflex for suspected UTI.   On day of admission, noted to be tachcyardic with low BP and sluggish, sent to ER again.  In ER, BP 81/60. EKG showed regular narrow complex tachycardia.  CXR suggested LLL pneumonia with superimposed edema.  Lactic acid 4.68.  Was given 30 cc/kg fluids, empiric antibiotics, admitted for sepsis.       Discharge Diagnoses:   Sepsis from pneumonia, left lower lobe, unknown organism Given fluid bolus at admission, SVT resolved, BP and lactic acid normalized.   Started on ceftriaxone and doxycycline.  WBC normalized quickly.  She was able to take PO, she had no O2 requirement or tachycardia/tachpnea, and her mentation returned to baseline (per reported baseline).  Strep pneumo negative.  Urine insignificant growth.  1/2 blood cultures CoNS/contaminant.  Discharged to complete 7 days antibiotics with Cefdinir and doxycycline.    1/2 blood cultures with CoNS Likely contaminant.  History of SVT Has a history of a paroxysmal SVT, AVNRT type.  This resolved overnight first hospital day with fluids.  Her blood pressures improved.  TSH minimally elevated, not significant.  Her heart rates remained normal on  metoprolol   Delirium She had nightly agitation/sundowning, within normal limits for degree of dementia.  Chronic kidney disease stage III, superimposed acute kidney injury Baseline creatinine 1.1, bumped to 1.8, now resolved with fluids.     Hypertension Antihypertensives held at admission.  BP very labile.  Losartan and Lasix resumed at discharge.   Chronic diastolic CHF EF 60 to 65%, grade 1 diastolic dysfunction in April.  Was given a lot of fluids in the ER.  Appeared euvolemic.   Hyperkalemia, resolved  Right leg hematoma Local wound cares as needed.     Discharge Instructions  Discharge Instructions    Diet - low sodium heart healthy   Complete by:  As directed    Discharge instructions   Complete by:  As directed    From Dr. Maryfrances Bunnellanford: Patient was admitted with pneumonia. She should receive 5 more days doxycycline and 5 days Cefdinir (9 more doses, given twice daily) for pneumonia.  Resume metoprolol, potassium, losartan, furosemide Check BMP for creatinine and potassium on Monday Aug 12   Increase activity slowly   Complete by:  As directed      Allergies as of 02/02/2018   No Known Allergies     Medication List    STOP taking these medications   cephALEXin 250 MG capsule Commonly known as:  KEFLEX     TAKE these medications   cefdinir 300 MG capsule Commonly known as:  OMNICEF Take 1 capsule (300 mg total) by mouth 2 (two) times daily for 9 doses.   CENTRUM SILVER 50+WOMEN Tabs Take 1 tablet by mouth daily.   doxycycline 100  MG tablet Commonly known as:  VIBRA-TABS Take 1 tablet (100 mg total) by mouth every 12 (twelve) hours for 9 doses.   furosemide 20 MG tablet Commonly known as:  LASIX Take 20 mg by mouth every other day.   losartan 25 MG tablet Commonly known as:  COZAAR Take 25 mg by mouth daily.   metoprolol succinate 25 MG 24 hr tablet Commonly known as:  TOPROL-XL Take 25 mg by mouth daily.   NON FORMULARY Medpass : Give  120 ml by mouth twice daily as suplement   potassium chloride 10 MEQ tablet Commonly known as:  K-DUR,KLOR-CON Take 10 mEq by mouth every other day.       No Known Allergies  Consultations:  None   Procedures/Studies: Dg Shoulder Right  Result Date: 01/29/2018 CLINICAL DATA:  84 year old female status post fall. Altered mental status. EXAM: RIGHT SHOULDER - 2+ VIEW COMPARISON:  Chest radiographs 09/29/2017. FINDINGS: 2 portable views of the right shoulder. No glenohumeral joint dislocation. Proximal right humerus appears intact. Bone mineralization is stable and normal for age. The right clavicle, scapula, and visible right ribs appear intact. IMPRESSION: No acute fracture or dislocation identified about the right shoulder. Electronically Signed   By: Odessa Fleming M.D.   On: 01/29/2018 10:29   Dg Tibia/fibula Left  Result Date: 01/29/2018 CLINICAL DATA:  84 year old female status post fall with left lower tib fib laceration. EXAM: LEFT TIBIA AND FIBULA - 2 VIEW COMPARISON:  None. FINDINGS: Bone mineralization is within normal limits for age. Alignment is maintained at the left knee and ankle. Moderate to severe lateral compartment knee joint space loss. Multicompartmental degenerative spurring at the knee. The left tibia and fibula appear intact. No acute osseous abnormality identified. Elongated 8-10 centimeter areas of soft tissue lobulation and increased density along the medial tib fib suggesting hematoma (image 2). No soft tissue gas. IMPRESSION: Medial left lower extremity soft tissue hematoma suspected. No acute osseous abnormality identified. Electronically Signed   By: Odessa Fleming M.D.   On: 01/29/2018 10:33   Dg Chest Port 1 View  Result Date: 01/30/2018 CLINICAL DATA:  84 year old found lethargic and unresponsive at the nursing home. Hypotensive upon EMS arrival (60/30). EXAM: PORTABLE CHEST 1 VIEW COMPARISON:  01/29/2018, 09/29/2017. FINDINGS: Cardiac silhouette moderately to markedly  enlarged. Thoracic aorta tortuous and atherosclerotic. Interval development of mild diffuse interstitial pulmonary edema. More focal patchy airspace opacities at the LEFT lung base. No focal airspace opacities elsewhere. IMPRESSION: 1. Stable moderate to marked cardiomegaly. Mild diffuse interstitial pulmonary edema indicates mild CHF and/or fluid overload. 2. Focal airspace opacities at the LEFT lung base, query superimposed pneumonia. Electronically Signed   By: Hulan Saas M.D.   On: 01/30/2018 18:04   Dg Chest Portable 1 View  Result Date: 01/29/2018 CLINICAL DATA:  84 year old female with altered mental status after fall. EXAM: PORTABLE CHEST 1 VIEW COMPARISON:  Chest radiographs 09/29/2017. FINDINGS: Portable AP semi upright view at 1004 hours. Stable lung volumes. Stable cardiomegaly and mediastinal contours. Visualized tracheal air column is within normal limits. Allowing for portable technique the lungs are clear. No acute osseous abnormality identified. Negative visible bowel gas pattern. Calcified aortic atherosclerosis. IMPRESSION: No acute cardiopulmonary abnormality or acute traumatic injury identified. Electronically Signed   By: Odessa Fleming M.D.   On: 01/29/2018 10:31      Subjective: No complaints.  No fever overnight.  Discussed with nursing.  Agitation at night improved, no respiratory distress or diarrhea/vomiting or complaints of pain.  Discharge Exam: Vitals:   02/02/18 1037 02/02/18 1215  BP: 128/76 129/88  Pulse: 75 97  Resp:  20  Temp:  98.9 F (37.2 C)  SpO2:  91%   Vitals:   02/01/18 2044 02/02/18 0355 02/02/18 1037 02/02/18 1215  BP: (!) 168/96 125/68 128/76 129/88  Pulse: 96 91 75 97  Resp: 20 18  20   Temp: 98.4 F (36.9 C) 99.2 F (37.3 C)  98.9 F (37.2 C)  TempSrc: Oral Oral  Oral  SpO2: 95% 91%  91%  Weight:  49.4 kg (108 lb 15.3 oz)      General: Pt is alert, awake, not in acute distress, lying in bed, interactive but not oriented to situation or  place Cardiovascular: RRR, S1/S2 +, no rubs, no gallops Respiratory: CTA bilaterally, no wheezing, no rhonchi Abdominal: Soft, NT, ND, bowel sounds + Extremities: no edema, no cyanosis    The results of significant diagnostics from this hospitalization (including imaging, microbiology, ancillary and laboratory) are listed below for reference.     Microbiology: Recent Results (from the past 240 hour(s))  Blood Culture (routine x 2)     Status: Abnormal   Collection Time: 01/30/18  5:24 PM  Result Value Ref Range Status   Specimen Description BLOOD LEFT HAND  Final   Special Requests   Final    BOTTLES DRAWN AEROBIC AND ANAEROBIC Blood Culture results may not be optimal due to an inadequate volume of blood received in culture bottles   Culture  Setup Time   Final    GRAM POSITIVE COCCI IN CLUSTERS AEROBIC BOTTLE ONLY CRITICAL RESULT CALLED TO, READ BACK BY AND VERIFIED WITH: A MASTERS,PHARMD AT 1925 01/31/18 BY L BENFIELD    Culture (A)  Final    STAPHYLOCOCCUS SPECIES (COAGULASE NEGATIVE) THE SIGNIFICANCE OF ISOLATING THIS ORGANISM FROM A SINGLE SET OF BLOOD CULTURES WHEN MULTIPLE SETS ARE DRAWN IS UNCERTAIN. PLEASE NOTIFY THE MICROBIOLOGY DEPARTMENT WITHIN ONE WEEK IF SPECIATION AND SENSITIVITIES ARE REQUIRED. Performed at [facility], [address] [place], [place] [address]    Report Status 02/02/2018 FINAL  Final  Blood Culture ID Panel (Reflexed)     Status: Abnormal   Collection Time: 01/30/18  5:24 PM  Result Value Ref Range Status   Enterococcus species NOT DETECTED NOT DETECTED Final   Listeria monocytogenes NOT DETECTED NOT DETECTED Final   Staphylococcus species DETECTED (A) NOT DETECTED Final    Comment: Methicillin (oxacillin) susceptible coagulase negative staphylococcus. Possible blood culture contaminant (unless isolated from more than one blood culture draw or clinical case suggests pathogenicity). No antibiotic treatment is indicated for blood  culture  contaminants. CRITICAL RESULT CALLED TO, READ BACK BY AND VERIFIED WITH: A MASTERS,PHARMD AT 1925 01/31/18 BY L BENFIELD    Staphylococcus aureus NOT DETECTED NOT DETECTED Final   Methicillin resistance NOT DETECTED NOT DETECTED Final   Streptococcus species NOT DETECTED NOT DETECTED Final   Streptococcus agalactiae NOT DETECTED NOT DETECTED Final   Streptococcus pneumoniae NOT DETECTED NOT DETECTED Final   Streptococcus pyogenes NOT DETECTED NOT DETECTED Final   Acinetobacter baumannii NOT DETECTED NOT DETECTED Final   Enterobacteriaceae species NOT DETECTED NOT DETECTED Final   Enterobacter cloacae complex NOT DETECTED NOT DETECTED Final   Escherichia coli NOT DETECTED NOT DETECTED Final   Klebsiella oxytoca NOT DETECTED NOT DETECTED Final   Klebsiella pneumoniae NOT DETECTED NOT DETECTED Final   Proteus species NOT DETECTED NOT DETECTED Final   Serratia marcescens NOT DETECTED NOT DETECTED  Final   Haemophilus influenzae NOT DETECTED NOT DETECTED Final   Neisseria meningitidis NOT DETECTED NOT DETECTED Final   Pseudomonas aeruginosa NOT DETECTED NOT DETECTED Final   Candida albicans NOT DETECTED NOT DETECTED Final   Candida glabrata NOT DETECTED NOT DETECTED Final   Candida krusei NOT DETECTED NOT DETECTED Final   Candida parapsilosis NOT DETECTED NOT DETECTED Final   Candida tropicalis NOT DETECTED NOT DETECTED Final    Comment: Performed at [facility], [address], [place], [place] [address]  Blood Culture (routine x 2)     Status: None (Preliminary result)   Collection Time: 01/30/18  5:43 PM  Result Value Ref Range Status   Specimen Description BLOOD RIGHT FOREARM  Final   Special Requests   Final    BOTTLES DRAWN AEROBIC AND ANAEROBIC Blood Culture adequate volume   Culture   Final    NO GROWTH 3 DAYS Performed at [facility], [address], [place], [place] [address]    Report Status PENDING  Incomplete  Urine culture     Status: Abnormal   Collection  Time: 01/30/18  7:31 PM  Result Value Ref Range Status   Specimen Description URINE, RANDOM  Final   Special Requests NONE  Final   Culture (A)  Final    <10,000 COLONIES/mL INSIGNIFICANT GROWTH Performed at [facility], [address], [place], [place] [address]    Report Status 01/31/2018 FINAL  Final     Labs: BNP (last 3 results) No results for input(s): BNP in the last 8760 hours. Basic Metabolic Panel: Recent Labs  Lab 01/29/18 0952 01/30/18 1724 02/01/18 0846 02/02/18 0627  NA 143 142 143 145  K 3.5 5.6* 3.2* 3.1*  CL 108 109 109 109  CO2 27 18* 25 27  GLUCOSE 107* 115* 99 95  BUN 17 29* 16 11  CREATININE 1.14* 1.84* 0.96 0.94  CALCIUM 8.7* 8.4* 8.6* 8.6*   Liver Function Tests: Recent Labs  Lab 01/29/18 0952 01/30/18 1724  AST 17 43*  ALT 12 15  ALKPHOS 51 55  BILITOT 0.8 1.8*  PROT 5.6* 5.1*  ALBUMIN 3.0* 3.0*   No results for input(s): LIPASE, AMYLASE in the last 168 hours. No results for input(s): AMMONIA in the last 168 hours. CBC: Recent Labs  Lab 01/29/18 0952 01/30/18 2009 02/01/18 0846 02/02/18 0627  WBC 10.3 11.1* 8.3 8.4  NEUTROABS 8.6* 8.0*  --   --   HGB 13.5 12.0 12.8 12.3  HCT 41.3 38.3 39.4 37.4  MCV 95.6 98.5 95.4 93.5  PLT 242 241 230 268   Cardiac Enzymes: Recent Labs  Lab 01/29/18 0952 01/30/18 1724  CKTOTAL  --  176  TROPONINI <0.03  --    BNP: Invalid input(s): POCBNP CBG: Recent Labs  Lab 01/30/18 2330 01/31/18 0809 02/01/18 0737  GLUCAP 96 90 82   D-Dimer No results for input(s): DDIMER in the last 72 hours. Hgb A1c No results for input(s): HGBA1C in the last 72 hours. Lipid Profile No results for input(s): CHOL, HDL, LDLCALC, TRIG, CHOLHDL, LDLDIRECT in the last 72 hours. Thyroid function studies Recent Labs    01/30/18 1841  TSH 4.553*  T4TOTAL 6.2   Anemia work up No results for input(s): VITAMINB12, FOLATE, FERRITIN, TIBC, IRON, RETICCTPCT in the last 72 hours. Urinalysis     Component Value Date/Time   COLORURINE YELLOW 01/30/2018 1931   APPEARANCEUR CLOUDY (A) 01/30/2018 1931   LABSPEC 1.018 01/30/2018 1931  PHURINE 5.0 01/30/2018 1931   GLUCOSEU 50 (A) 01/30/2018 1931   HGBUR SMALL (A) 01/30/2018 1931   BILIRUBINUR NEGATIVE 01/30/2018 1931   KETONESUR NEGATIVE 01/30/2018 1931   PROTEINUR 100 (A) 01/30/2018 1931   NITRITE NEGATIVE 01/30/2018 1931   LEUKOCYTESUR LARGE (A) 01/30/2018 1931   Sepsis Labs Invalid input(s): PROCALCITONIN,  WBC,  LACTICIDVEN Microbiology Recent Results (from the past 240 hour(s))  Blood Culture (routine x 2)     Status: Abnormal   Collection Time: 01/30/18  5:24 PM  Result Value Ref Range Status   Specimen Description BLOOD LEFT HAND  Final   Special Requests   Final    BOTTLES DRAWN AEROBIC AND ANAEROBIC Blood Culture results may not be optimal due to an inadequate volume of blood received in culture bottles   Culture  Setup Time   Final    GRAM POSITIVE COCCI IN CLUSTERS AEROBIC BOTTLE ONLY CRITICAL RESULT CALLED TO, READ BACK BY AND VERIFIED WITH: A MASTERS,PHARMD AT 1925 01/31/18 BY L BENFIELD    Culture (A)  Final    STAPHYLOCOCCUS SPECIES (COAGULASE NEGATIVE) THE SIGNIFICANCE OF ISOLATING THIS ORGANISM FROM A SINGLE SET OF BLOOD CULTURES WHEN MULTIPLE SETS ARE DRAWN IS UNCERTAIN. PLEASE NOTIFY THE MICROBIOLOGY DEPARTMENT WITHIN ONE WEEK IF SPECIATION AND SENSITIVITIES ARE REQUIRED. Performed at [facility], [address] [place], [place] [address]    Report Status 02/02/2018 FINAL  Final  Blood Culture ID Panel (Reflexed)     Status: Abnormal   Collection Time: 01/30/18  5:24 PM  Result Value Ref Range Status   Enterococcus species NOT DETECTED NOT DETECTED Final   Listeria monocytogenes NOT DETECTED NOT DETECTED Final   Staphylococcus species DETECTED (A) NOT DETECTED Final    Comment: Methicillin (oxacillin) susceptible coagulase negative staphylococcus. Possible blood culture contaminant (unless  isolated from more than one blood culture draw or clinical case suggests pathogenicity). No antibiotic treatment is indicated for blood  culture contaminants. CRITICAL RESULT CALLED TO, READ BACK BY AND VERIFIED WITH: A MASTERS,PHARMD AT 1925 01/31/18 BY L BENFIELD    Staphylococcus aureus NOT DETECTED NOT DETECTED Final   Methicillin resistance NOT DETECTED NOT DETECTED Final   Streptococcus species NOT DETECTED NOT DETECTED Final   Streptococcus agalactiae NOT DETECTED NOT DETECTED Final   Streptococcus pneumoniae NOT DETECTED NOT DETECTED Final   Streptococcus pyogenes NOT DETECTED NOT DETECTED Final   Acinetobacter baumannii NOT DETECTED NOT DETECTED Final   Enterobacteriaceae species NOT DETECTED NOT DETECTED Final   Enterobacter cloacae complex NOT DETECTED NOT DETECTED Final   Escherichia coli NOT DETECTED NOT DETECTED Final   Klebsiella oxytoca NOT DETECTED NOT DETECTED Final   Klebsiella pneumoniae NOT DETECTED NOT DETECTED Final   Proteus species NOT DETECTED NOT DETECTED Final   Serratia marcescens NOT DETECTED NOT DETECTED Final   Haemophilus influenzae NOT DETECTED NOT DETECTED Final   Neisseria meningitidis NOT DETECTED NOT DETECTED Final   Pseudomonas aeruginosa NOT DETECTED NOT DETECTED Final   Candida albicans NOT DETECTED NOT DETECTED Final   Candida glabrata NOT DETECTED NOT DETECTED Final   Candida krusei NOT DETECTED NOT DETECTED Final   Candida parapsilosis NOT DETECTED NOT DETECTED Final   Candida tropicalis NOT DETECTED NOT DETECTED Final    Comment: Performed at [facility], [address] [place], [place] [address]  Blood Culture (routine x 2)     Status: None (Preliminary result)   Collection Time: 01/30/18  5:43 PM  Result Value Ref Range Status  Specimen Description BLOOD RIGHT FOREARM  Final   Special Requests   Final    BOTTLES DRAWN AEROBIC AND ANAEROBIC Blood Culture adequate volume   Culture   Final    NO GROWTH 3 DAYS Performed at [facility], [address], [place], [place] [address]    Report Status PENDING  Incomplete  Urine culture     Status: Abnormal   Collection Time: 01/30/18  7:31 PM  Result Value Ref Range Status   Specimen Description URINE, RANDOM  Final   Special Requests NONE  Final   Culture (A)  Final    <10,000 COLONIES/mL INSIGNIFICANT GROWTH Performed at [facility], [address], [place], [place] [address]    Report Status 01/31/2018 FINAL  Final     Time coordinating discharge: 25 minutes       SIGNED:   Alberteen Sam, MD  [facility] 02/02/2018, 12:37 PM

## 2018-02-02 NOTE — Clinical Social Work Note (Signed)
Clinical Social Work Assessment  Patient Details  Name: Madeline Parrish MRN: [id number] Date of Birth: Apr 04, 1922  Date of referral:  02/02/18               Reason for consult:  Facility Placement, Discharge Planning                Permission sought to share information with:  Facility Medical sales representativeContact Representative, Family Supports Permission granted to share information::     Name::     Mamie LaurelRichard McNeely  Agency::  [facility]  Relationship::  Brother  Contact Information:  [phone]  Housing/Transportation Living arrangements for the past 2 months:  Skilled Nursing Facility Source of Information:  Medical Team, Facility Patient Interpreter Needed:  None Criminal Activity/Legal Involvement Pertinent to Current Situation/Hospitalization:  No - Comment as needed Significant Relationships:  Siblings Lives with:  Facility Resident Do you feel safe going back to the place where you live?  Yes Need for family participation in patient care:  Yes (Comment)  Care giving concerns:  Patient is a long-term resident at [facility].   Social Worker assessment / plan:  Patient only oriented to self. CSW received call back from patient's brother. CSW introduced role and explained that discharge planning would be discussed. Patient's brother confirmed she was admitted from [facility] and plan is for her to return. CSW notified patient's brother that discharge orders are in for her to go back today. She will need PTAR. No further concerns. CSW encouraged patient's brother to contact CSW as needed. CSW will continue to follow patient and her brother for support and facilitate discharge back to SNF today.  Employment status:  Retired Health and safety inspectornsurance information:  Medicare PT Recommendations:  Skilled Nursing Facility Information / Referral to community resources:  Skilled Nursing Facility  Patient/Family's Response to care:  Patient only oriented to self. Patient's brother agreeable to return to SNF.  Patient's brother supportive and involved in patient's care. Patient's brother appreciated social work intervention.  Patient/Family's Understanding of and Emotional Response to Diagnosis, Current Treatment, and Prognosis:  Patient only oriented to self. Patient's brother has a good understanding of the reason for admission and plan to return to SNF today. Patient's brother appears happy with hospital care.  Emotional Assessment Appearance:  Appears stated age Attitude/Demeanor/Rapport:  Unable to Assess Affect (typically observed):  Unable to Assess Orientation:  Oriented to Self Alcohol / Substance use:  Never Used Psych involvement (Current and /or in the community):  No (Comment)  Discharge Needs  Concerns to be addressed:  Care Coordination Readmission within the last 30 days:  No Current discharge risk:  Cognitively Impaired, Dependent with Mobility Barriers to Discharge:  No Barriers Identified   Margarito LinerSarah C Sheamus Hasting, LCSW 02/02/2018, 12:53 PM

## 2018-02-02 NOTE — [place] FL2 (Signed)
[place] MEDICAID FL2 LEVEL OF CARE SCREENING TOOL     IDENTIFICATION  Patient Name: Madeline Parrish Birthdate: 12/30/1921 Sex: female Admission Date (Current Location): 01/30/2018  [facility] and [place] Number:  Producer, television/film/video and Address:  [facility], [address], [place], [place] [address]      Provider Number: [id number]  Attending Physician Name and Address:  Alberteen Sam, *  Relative Name and Phone Number:       Current Level of Care: Hospital Recommended Level of Care: Skilled Nursing Facility Prior Approval Number:    Date Approved/Denied:   PASRR Number: [id number]  Discharge Plan: SNF    Current Diagnoses: Patient Active Problem List   Diagnosis Date Noted  . Acute on chronic diastolic CHF (congestive heart failure) (HCC) 01/30/2018  . Sepsis due to pneumonia (HCC) 01/30/2018  . Agitation 01/30/2018  . Right leg injury, subsequent encounter 01/30/2018  . Benign hypertension with chronic kidney disease, stage III (HCC) 10/06/2017  . Chronic kidney disease, stage III (moderate) (HCC) 10/06/2017  . Restrictive airway disease 10/06/2017  . Fall 09/29/2017  . Acute renal failure superimposed on stage 3 chronic kidney disease (HCC) 09/29/2017  . Bilateral lower extremity edema 08/15/2015  . Paroxysmal supraventricular tachycardia (HCC) 12/06/2013  . Essential hypertension, benign 12/06/2013  . Syncope and collapse 12/06/2013    Orientation RESPIRATION BLADDER Height & Weight     Self  Normal Incontinent, External catheter Weight: 108 lb 15.3 oz (49.4 kg) Height:     BEHAVIORAL SYMPTOMS/MOOD NEUROLOGICAL BOWEL NUTRITION STATUS  Other (Comment)(None) (None) Continent Diet(Heart heathy)  AMBULATORY STATUS COMMUNICATION OF NEEDS Skin   Limited Assist Verbally Skin abrasions, Other (Comment)(Small toe nail taken off: left foot)                       Personal Care Assistance Level of Assistance             Functional Limitations Info  Sight, Hearing, Speech Sight Info: Adequate Hearing Info: Adequate Speech Info: Adequate    SPECIAL CARE FACTORS FREQUENCY  Blood pressure, PT (By licensed PT)     PT Frequency: 5 x week              Contractures Contractures Info: Not present    Additional Factors Info  Code Status, Allergies Code Status Info: DNR Allergies Info: NKDA           Current Medications (02/02/2018):  This is the current hospital active medication list Current Facility-Administered Medications  Medication Dose Route Frequency Provider Last Rate Last Dose  . 0.9 %  sodium chloride infusion  250 mL Intravenous PRN Opyd, Lavone Neri, MD 10 mL/hr at 02/01/18 2119 250 mL at 02/01/18 2119  . acetaminophen (TYLENOL) tablet 650 mg  650 mg Oral Q6H PRN Opyd, Lavone Neri, MD       Or  . acetaminophen (TYLENOL) suppository 650 mg  650 mg Rectal Q6H PRN Opyd, Lavone Neri, MD      . cefTRIAXone (ROCEPHIN) 1 g in sodium chloride 0.9 % 100 mL IVPB  1 g Intravenous QHS Alberteen Sam, MD 200 mL/hr at 02/01/18 2120 1 g at 02/01/18 2120  . doxycycline (VIBRA-TABS) tablet 100 mg  100 mg Oral Q12H Danford, Earl Lites, MD   100 mg at 02/02/18 1038  . haloperidol (HALDOL) tablet 2 mg  2 mg Oral QHS PRN Alberteen Sam, MD   2 mg at 02/01/18 2336  .  HYDROcodone-acetaminophen (NORCO/VICODIN) 5-325 MG per tablet 1-2 tablet  1-2 tablet Oral Q4H PRN Opyd, Lavone Neriimothy S, MD      . metoprolol succinate (TOPROL-XL) 24 hr tablet 25 mg  25 mg Oral Daily Opyd, Lavone Neriimothy S, MD   25 mg at 02/02/18 1037  . multivitamin with minerals tablet 1 tablet  1 tablet Oral Daily Opyd, Lavone Neriimothy S, MD   1 tablet at 02/02/18 1038  . ondansetron (ZOFRAN) tablet 4 mg  4 mg Oral Q6H PRN Opyd, Lavone Neriimothy S, MD       Or  . ondansetron (ZOFRAN) injection 4 mg  4 mg Intravenous Q6H PRN Opyd, Lavone Neriimothy S, MD      . potassium chloride SA (K-DUR,KLOR-CON) CR tablet 20 mEq  20 mEq Oral BID Alberteen Samanford, Christopher P, MD   20  mEq at 02/02/18 1038  . senna-docusate (Senokot-S) tablet 1 tablet  1 tablet Oral QHS PRN Opyd, Lavone Neriimothy S, MD      . sodium chloride flush (NS) 0.9 % injection 3 mL  3 mL Intravenous Q12H Opyd, Lavone Neriimothy S, MD   3 mL at 02/01/18 2113  . sodium chloride flush (NS) 0.9 % injection 3 mL  3 mL Intravenous Q12H Opyd, Lavone Neriimothy S, MD   3 mL at 02/02/18 1039  . sodium chloride flush (NS) 0.9 % injection 3 mL  3 mL Intravenous PRN Opyd, Lavone Neriimothy S, MD         Discharge Medications: Please see discharge summary for a list of discharge medications.  Relevant Imaging Results:  Relevant Lab Results:   Additional Information SS#: [id number]  Margarito LinerSarah C Nyal Schachter, LCSW

## 2018-02-03 ENCOUNTER — Non-Acute Institutional Stay (SKILLED_NURSING_FACILITY): Payer: Medicare Other | Admitting: Internal Medicine

## 2018-02-03 ENCOUNTER — Encounter: Payer: Self-pay | Admitting: Internal Medicine

## 2018-02-03 DIAGNOSIS — J181 Lobar pneumonia, unspecified organism: Secondary | ICD-10-CM

## 2018-02-03 DIAGNOSIS — N183 Chronic kidney disease, stage 3 unspecified: Secondary | ICD-10-CM

## 2018-02-03 DIAGNOSIS — I129 Hypertensive chronic kidney disease with stage 1 through stage 4 chronic kidney disease, or unspecified chronic kidney disease: Secondary | ICD-10-CM

## 2018-02-03 DIAGNOSIS — I5032 Chronic diastolic (congestive) heart failure: Secondary | ICD-10-CM | POA: Diagnosis not present

## 2018-02-03 DIAGNOSIS — S8012XD Contusion of left lower leg, subsequent encounter: Secondary | ICD-10-CM

## 2018-02-03 DIAGNOSIS — N179 Acute kidney failure, unspecified: Secondary | ICD-10-CM

## 2018-02-03 DIAGNOSIS — A419 Sepsis, unspecified organism: Secondary | ICD-10-CM

## 2018-02-03 DIAGNOSIS — J189 Pneumonia, unspecified organism: Secondary | ICD-10-CM | POA: Diagnosis not present

## 2018-02-03 DIAGNOSIS — I471 Supraventricular tachycardia: Secondary | ICD-10-CM | POA: Diagnosis not present

## 2018-02-03 DIAGNOSIS — R410 Disorientation, unspecified: Secondary | ICD-10-CM

## 2018-02-03 NOTE — Progress Notes (Signed)
:   Location:  [facility] Room Number: 205D Place of Service:  SNF (31)  [name]. [name], MD  Patient Care Team: Via, Caryn Bee, MD as PCP - General [facility])  Extended Emergency Contact Information Primary Emergency Contact: McNeely,Richard Address: [address]          [place], [place] [address] [place] Home Phone: [phone] Relation: Brother     Allergies: Patient has no known allergies.  Chief Complaint  Patient presents with  . Readmit To SNF    Admit to [facility]    HPI: Patient is 84 y.o. female With dementia, chronic kidney disease stage III, hypertension, paroxysmal SVT, who presented to the emergency department with hypotension and tachycardia.  Patient had fallen at her facility yesterday, has suffered a hematoma to her left leg was evaluated in the ER, and discharged back to SNF with Keflex for suspected UTI.  On day of admission patient was noted to be tachycardic with low blood pressure of 81/60 with a EKG showing narrow complex tachycardia.  Chest x-ray suggested left lower lobe pneumonia superimposed on edema.  Lactic acid was 4.68 patient was given a bolus of IV fluids empiric antibiotic and admitted for sepsis.  Patient was admitted to [facility] from 8/2-5 where she was treated for sepsis secondary to pneumonia and SVT which night with IV fluids.  Patient did have nightly sundowning which was within normal degree for dementia.  She has chronic kidney disease superimposed on acute kidney injury with a baseline of creatinine of 1.1 which resolved with IV fluids.  Patient is admitted back to skilled nursing facility for OT/PT and residential care.  While at skilled nursing facility patient will be followed for hypertension treated with losartan and metoprolol and Lasix, history of SVT treated with metoprolol and hypokalemia treated with potassium.  Past Medical History:  Diagnosis Date  . Acne rosacea     . CKD (chronic kidney disease), stage III (HCC)   . Depression   . Hypertension   . Osteopenia   . PUD (peptic ulcer disease)   . Restrictive lung disease   . Shingles   . Tinnitus    and vertigo    History reviewed. No pertinent surgical history.  Allergies as of 02/03/2018   No Known Allergies     Medication List        Accurate as of 02/03/18 10:38 AM. Always use your most recent med list.          cefdinir 300 MG capsule Commonly known as:  OMNICEF Take 1 capsule (300 mg total) by mouth 2 (two) times daily for 9 doses.   CENTRUM SILVER 50+WOMEN Tabs Take 1 tablet by mouth daily.   doxycycline 100 MG tablet Commonly known as:  VIBRA-TABS Take 1 tablet (100 mg total) by mouth every 12 (twelve) hours for 9 doses.   furosemide 20 MG tablet Commonly known as:  LASIX Take 20 mg by mouth every other day.   losartan 25 MG tablet Commonly known as:  COZAAR Take 25 mg by mouth daily.   metoprolol succinate 25 MG 24 hr tablet Commonly known as:  TOPROL-XL Take 25 mg by mouth daily.   NON FORMULARY Medpass : Give 120 ml by mouth twice daily as suplement   potassium chloride 10 MEQ tablet Commonly known as:  K-DUR,KLOR-CON Take 10 mEq by mouth every other day.       No orders of  the defined types were placed in this encounter.   Immunization History  Administered Date(s) Administered  . Influenza, High Dose Seasonal PF 03/31/2017    Social History   Tobacco Use  . Smoking status: Never Smoker  . Smokeless tobacco: Never Used  Substance Use Topics  . Alcohol use: No    Family history is   Family History  Problem Relation Age of Onset  . CVA Mother       Review of Systems  DATA OBTAINED: from patient, nurse GENERAL:  no fevers, fatigue, appetite changes SKIN: No itching, or rash EYES: No eye pain, redness, discharge EARS: No earache, tinnitus, change in hearing NOSE: No congestion, drainage or bleeding  MOUTH/THROAT: No mouth or tooth  pain, No sore throat RESPIRATORY: No cough, wheezing, SOB CARDIAC: No chest pain, palpitations, lower extremity edema  GI: No abdominal pain, No N/V/D or constipation, No heartburn or reflux  GU: No dysuria, frequency or urgency, or incontinence  MUSCULOSKELETAL: No unrelieved bone/joint pain NEUROLOGIC: No headache, dizziness or focal weakness PSYCHIATRIC: No c/o anxiety or sadness   Vitals:   02/03/18 1034  BP: 110/66  Pulse: 84  Resp: 16  Temp: 98.9 F (37.2 C)    SpO2 Readings from Last 1 Encounters:  02/02/18 97%   Body mass index is 18.54 kg/m.     Physical Exam  GENERAL APPEARANCE: Alert, conversant,  No acute distress.  SKIN: No diaphoresis rash HEAD: Normocephalic, atraumatic  EYES: Conjunctiva/lids clear. Pupils round, reactive. EOMs intact.  EARS: External exam WNL, canals clear. Hearing grossly normal.  NOSE: No deformity or discharge.  MOUTH/THROAT: Lips w/o lesions  RESPIRATORY: Breathing is even, unlabored. Lung sounds are clear   CARDIOVASCULAR: Heart RRR no murmurs, rubs or gallops. No peripheral edema.:  Very large hematoma left medial leg GASTROINTESTINAL: Abdomen is soft, non-tender, not distended w/ normal bowel sounds. GENITOURINARY: Bladder non tender, not distended  MUSCULOSKELETAL: No abnormal joints or musculature NEUROLOGIC:  Cranial nerves 2-12 grossly intact. Moves all extremities  PSYCHIATRIC: Mood and affect appropriate to situation dementia, no behavioral issues  Patient Active Problem List   Diagnosis Date Noted  . Acute on chronic diastolic CHF (congestive heart failure) (HCC) 01/30/2018  . Sepsis due to pneumonia (HCC) 01/30/2018  . Agitation 01/30/2018  . Right leg injury, subsequent encounter 01/30/2018  . Benign hypertension with chronic kidney disease, stage III (HCC) 10/06/2017  . Chronic kidney disease, stage III (moderate) (HCC) 10/06/2017  . Restrictive airway disease 10/06/2017  . Fall 09/29/2017  . Acute renal failure  superimposed on stage 3 chronic kidney disease (HCC) 09/29/2017  . Bilateral lower extremity edema 08/15/2015  . Paroxysmal supraventricular tachycardia (HCC) 12/06/2013  . Essential hypertension, benign 12/06/2013  . Syncope and collapse 12/06/2013      Labs reviewed: Basic Metabolic Panel:    Component Value Date/Time   NA 145 02/02/2018 0627   NA 147 10/17/2017   K 3.1 (L) 02/02/2018 0627   CL 109 02/02/2018 0627   CO2 27 02/02/2018 0627   GLUCOSE 95 02/02/2018 0627   BUN 11 02/02/2018 0627   BUN 22 (A) 10/17/2017   CREATININE 0.94 02/02/2018 0627   CREATININE 1.07 (H) 08/15/2015 1422   CALCIUM 8.6 (L) 02/02/2018 0627   PROT 5.1 (L) 01/30/2018 1724   ALBUMIN 3.0 (L) 01/30/2018 1724   AST 43 (H) 01/30/2018 1724   ALT 15 01/30/2018 1724   ALKPHOS 55 01/30/2018 1724   BILITOT 1.8 (H) 01/30/2018 1724   GFRNONAA 50 (L)  02/02/2018 0627   GFRAA 58 (L) 02/02/2018 0627    Recent Labs    01/30/18 1724 02/01/18 0846 02/02/18 0627  NA 142 143 145  K 5.6* 3.2* 3.1*  CL 109 109 109  CO2 18* 25 27  GLUCOSE 115* 99 95  BUN 29* 16 11  CREATININE 1.84* 0.96 0.94  CALCIUM 8.4* 8.6* 8.6*   Liver Function Tests: Recent Labs    09/30/17 0728 01/29/18 0952 01/30/18 1724  AST 108* 17 43*  ALT 36 12 15  ALKPHOS 43 51 55  BILITOT 1.1 0.8 1.8*  PROT 5.0* 5.6* 5.1*  ALBUMIN 2.5* 3.0* 3.0*   No results for input(s): LIPASE, AMYLASE in the last 8760 hours. No results for input(s): AMMONIA in the last 8760 hours. CBC: Recent Labs    09/29/17 1127  01/29/18 0952 01/30/18 2009 02/01/18 0846 02/02/18 0627  WBC 13.5*   < > 10.3 11.1* 8.3 8.4  NEUTROABS 11.4*  --  8.6* 8.0*  --   --   HGB 13.8   < > 13.5 12.0 12.8 12.3  HCT 42.6   < > 41.3 38.3 39.4 37.4  MCV 95.1   < > 95.6 98.5 95.4 93.5  PLT 252   < > 242 241 230 268   < > = values in this interval not displayed.   Lipid No results for input(s): CHOL, HDL, LDLCALC, TRIG in the last 8760 hours.  Cardiac  Enzymes: Recent Labs    09/30/17 0229 09/30/17 0728 10/02/17 0627 10/03/17 0739 01/29/18 0952 01/30/18 1724  CKTOTAL  --  3,002* 1,109* 544*  --  176  TROPONINI 0.88* 0.73*  --   --  <0.03  --    BNP: No results for input(s): BNP in the last 8760 hours. No results found for: MICROALBUR No results found for: HGBA1C Lab Results  Component Value Date   TSH 4.553 (H) 01/30/2018   No results found for: VITAMINB12 No results found for: FOLATE No results found for: IRON, TIBC, FERRITIN  Imaging and Procedures obtained prior to SNF admission: Dg Chest Port 1 View  Result Date: 01/30/2018 CLINICAL DATA:  84 year old found lethargic and unresponsive at the nursing home. Hypotensive upon EMS arrival (60/30). EXAM: PORTABLE CHEST 1 VIEW COMPARISON:  01/29/2018, 09/29/2017. FINDINGS: Cardiac silhouette moderately to markedly enlarged. Thoracic aorta tortuous and atherosclerotic. Interval development of mild diffuse interstitial pulmonary edema. More focal patchy airspace opacities at the LEFT lung base. No focal airspace opacities elsewhere. IMPRESSION: 1. Stable moderate to marked cardiomegaly. Mild diffuse interstitial pulmonary edema indicates mild CHF and/or fluid overload. 2. Focal airspace opacities at the LEFT lung base, query superimposed pneumonia. Electronically Signed   By: Hulan Saas M.D.   On: 01/30/2018 18:04     Not all labs, radiology exams or other studies done during hospitalization come through on my EPIC note; however they are reviewed by me.    Assessment and Plan  Cyst/left lower lobe pneumonia-with fluid bolus and admission SVT resolved blood pressure and lactic acid normalized patient was started on Rocephin and doxycycline and WBC normalized quickly.  Patient is to be discharged with 7 days of p.o. Antibiotics SNF -OT/PT; continue doxycycline 100 mg every 12 hours for 9 more doses and cefdinir 300 mg twice daily for 9 more doses  History of  SVT- patient with  paroxysmal SVT, which resolved overnight with IV fluids with normal pressures; TSH was minimally elevated, not significant.  Patient's heart rate remained normal on metoprolol SNF -continue  metoprolol once daily  Delirium- nightly sundown, resolved  Acute kidney injury on chronic kidney disease- baseline creatinine 1.1 which bumped up to 1.8, resolved with IV fluids SNF-follow-up BMP  Hypertension SNF -continue Toprol-XL 25 mg daily and losartan 25 mg daily  Chronic diastolic congestive heart failure- EF 60-65%, grade 1 diastolic dysfunction in April; patient is euvolemic SNF -continue Toprol-XL 25 mg daily and Lasix 20 mg every other day  Leg hematoma- recommended local wound care as needed SNF -followed by wound care nurse   For greater than 45 minutes;> 50% of time with patient was spent reviewing records, labs, tests and studies, counseling and developing plan of care  Thurston Holenne D. Lyn HollingsheadAlexander, MD

## 2018-02-04 LAB — CULTURE, BLOOD (ROUTINE X 2)
CULTURE: NO GROWTH
SPECIAL REQUESTS: ADEQUATE

## 2018-02-06 ENCOUNTER — Non-Acute Institutional Stay (SKILLED_NURSING_FACILITY): Payer: Medicare Other | Admitting: Internal Medicine

## 2018-02-06 DIAGNOSIS — Z0189 Encounter for other specified special examinations: Secondary | ICD-10-CM | POA: Diagnosis not present

## 2018-02-06 DIAGNOSIS — S8012XA Contusion of left lower leg, initial encounter: Secondary | ICD-10-CM | POA: Diagnosis not present

## 2018-02-06 DIAGNOSIS — L03116 Cellulitis of left lower limb: Secondary | ICD-10-CM

## 2018-02-06 DIAGNOSIS — Z7689 Persons encountering health services in other specified circumstances: Secondary | ICD-10-CM

## 2018-02-09 ENCOUNTER — Encounter: Payer: Self-pay | Admitting: Internal Medicine

## 2018-02-09 DIAGNOSIS — I5032 Chronic diastolic (congestive) heart failure: Secondary | ICD-10-CM | POA: Insufficient documentation

## 2018-02-09 DIAGNOSIS — J181 Lobar pneumonia, unspecified organism: Secondary | ICD-10-CM

## 2018-02-09 DIAGNOSIS — R410 Disorientation, unspecified: Secondary | ICD-10-CM | POA: Insufficient documentation

## 2018-02-09 DIAGNOSIS — S8012XA Contusion of left lower leg, initial encounter: Secondary | ICD-10-CM | POA: Insufficient documentation

## 2018-02-09 DIAGNOSIS — J189 Pneumonia, unspecified organism: Secondary | ICD-10-CM | POA: Insufficient documentation

## 2018-02-09 NOTE — Progress Notes (Signed)
:   Location:  [facility] Room Number: 205D Place of Service:  SNF (31)  [name]. [name], MD  Patient Care Team: Via, Caryn Bee, MD as PCP - General [facility])  Extended Emergency Contact Information Primary Emergency Contact: McNeely,Richard Address: [address]          [place], [place] [address] [place] Home Phone: [phone] Relation: Brother     Allergies: Patient has no known allergies.  Chief Complaint  Patient presents with  . Acute Visit    L. leg hematoma with cellulis    HPI: Patient is 84 y.o. female who is being seen for infected hematoma.  Hematoma had been noted during patient's prior hospitalization with recommendations to leave it alone.  However now the area has diffuse redness and heat so it needs to be opened and drained.  Patient herself does not have fever.  Past Medical History:  Diagnosis Date  . Acne rosacea   . CKD (chronic kidney disease), stage III (HCC)   . Depression   . Hypertension   . Osteopenia   . PUD (peptic ulcer disease)   . Restrictive lung disease   . Shingles   . Tinnitus    and vertigo    History reviewed. No pertinent surgical history.  Allergies as of 02/06/2018   No Known Allergies     Medication List        Accurate as of 02/06/18 11:59 PM. Always use your most recent med list.          cefdinir 300 MG capsule Commonly known as:  OMNICEF Take 1 capsule (300 mg total) by mouth 2 (two) times daily for 9 doses.   CENTRUM SILVER 50+WOMEN Tabs Take 1 tablet by mouth daily.   doxycycline 100 MG tablet Commonly known as:  VIBRA-TABS Take 1 tablet (100 mg total) by mouth every 12 (twelve) hours for 9 doses.   furosemide 20 MG tablet Commonly known as:  LASIX Take 20 mg by mouth every other day.   losartan 25 MG tablet Commonly known as:  COZAAR Take 25 mg by mouth daily.   metoprolol succinate 25 MG 24 hr tablet Commonly known as:  TOPROL-XL Take 25  mg by mouth daily.   NON FORMULARY Medpass : Give 120 ml by mouth twice daily as suplement   potassium chloride 10 MEQ tablet Commonly known as:  K-DUR,KLOR-CON Take 10 mEq by mouth every other day.       No orders of the defined types were placed in this encounter.   Immunization History  Administered Date(s) Administered  . Influenza, High Dose Seasonal PF 03/31/2017    Social History   Tobacco Use  . Smoking status: Never Smoker  . Smokeless tobacco: Never Used  Substance Use Topics  . Alcohol use: No    Family history is   Family History  Problem Relation Age of Onset  . CVA Mother       Review of Systems  DATA OBTAINED: from patient, nurse GENERAL:  no fevers, fatigue, appetite changes SKIN: Very large hematoma left medial leg  EYES: No eye pain, redness, discharge EARS: No earache, tinnitus, change in hearing NOSE: No congestion, drainage or bleeding  MOUTH/THROAT: No mouth or tooth pain, No sore throat RESPIRATORY: No cough, wheezing, SOB CARDIAC: No chest pain, palpitations, lower extremity edema  GI: No abdominal pain, No N/V/D or constipation, No heartburn or reflux  GU: No dysuria, frequency or  urgency, or incontinence  MUSCULOSKELETAL: No unrelieved bone/joint pain NEUROLOGIC: No headache, dizziness or focal weakness PSYCHIATRIC: No c/o anxiety or sadness   Vitals:   02/09/18 1229  BP: 127/78  Pulse: 66  Resp: 18  Temp: 98 F (36.7 C)    SpO2 Readings from Last 1 Encounters:  02/02/18 97%   Body mass index is 18.88 kg/m.     Physical Exam  GENERAL APPEARANCE: Alert, conversant,  No acute distress.  SKIN: Very large hematoma left medial leg which is fluctuant with surrounding erythema and heat which is tender to palpation HEAD: Normocephalic, atraumatic  EYES: Conjunctiva/lids clear. Pupils round, reactive. EOMs intact.  EARS: External exam WNL, canals clear. Hearing grossly normal.  NOSE: No deformity or discharge.    MOUTH/THROAT: Lips w/o lesions  RESPIRATORY: Breathing is even, unlabored. Lung sounds are clear   CARDIOVASCULAR: Heart RRR no murmurs, rubs or gallops. No peripheral edema.   GASTROINTESTINAL: Abdomen is soft, non-tender, not distended w/ normal bowel sounds. GENITOURINARY: Bladder non tender, not distended  MUSCULOSKELETAL: No abnormal joints or musculature NEUROLOGIC:  Cranial nerves 2-12 grossly intact. Moves all extremities  PSYCHIATRIC: Mood and affect appropriate to situation with some dementia, no behavioral issues  Patient Active Problem List   Diagnosis Date Noted  . Pneumonia of lower lobe of lung (HCC) 02/09/2018  . Delirium 02/09/2018  . Chronic diastolic (congestive) heart failure (HCC) 02/09/2018  . Hematoma of left lower extremity 02/09/2018  . Acute on chronic diastolic CHF (congestive heart failure) (HCC) 01/30/2018  . Sepsis due to pneumonia (HCC) 01/30/2018  . Agitation 01/30/2018  . Right leg injury, subsequent encounter 01/30/2018  . Benign hypertension with chronic kidney disease, stage III (HCC) 10/06/2017  . Chronic kidney disease, stage III (moderate) (HCC) 10/06/2017  . Restrictive airway disease 10/06/2017  . Fall 09/29/2017  . Acute renal failure superimposed on stage 3 chronic kidney disease (HCC) 09/29/2017  . Bilateral lower extremity edema 08/15/2015  . Paroxysmal supraventricular tachycardia (HCC) 12/06/2013  . Essential hypertension, benign 12/06/2013  . Syncope and collapse 12/06/2013      Labs reviewed: Basic Metabolic Panel:    Component Value Date/Time   NA 145 02/02/2018 0627   NA 147 10/17/2017   K 3.1 (L) 02/02/2018 0627   CL 109 02/02/2018 0627   CO2 27 02/02/2018 0627   GLUCOSE 95 02/02/2018 0627   BUN 11 02/02/2018 0627   BUN 22 (A) 10/17/2017   CREATININE 0.94 02/02/2018 0627   CREATININE 1.07 (H) 08/15/2015 1422   CALCIUM 8.6 (L) 02/02/2018 0627   PROT 5.1 (L) 01/30/2018 1724   ALBUMIN 3.0 (L) 01/30/2018 1724   AST 43  (H) 01/30/2018 1724   ALT 15 01/30/2018 1724   ALKPHOS 55 01/30/2018 1724   BILITOT 1.8 (H) 01/30/2018 1724   GFRNONAA 50 (L) 02/02/2018 0627   GFRAA 58 (L) 02/02/2018 0627    Recent Labs    01/30/18 1724 02/01/18 0846 02/02/18 0627  NA 142 143 145  K 5.6* 3.2* 3.1*  CL 109 109 109  CO2 18* 25 27  GLUCOSE 115* 99 95  BUN 29* 16 11  CREATININE 1.84* 0.96 0.94  CALCIUM 8.4* 8.6* 8.6*   Liver Function Tests: Recent Labs    09/30/17 0728 01/29/18 0952 01/30/18 1724  AST 108* 17 43*  ALT 36 12 15  ALKPHOS 43 51 55  BILITOT 1.1 0.8 1.8*  PROT 5.0* 5.6* 5.1*  ALBUMIN 2.5* 3.0* 3.0*   No results for input(s): LIPASE, AMYLASE  in the last 8760 hours. No results for input(s): AMMONIA in the last 8760 hours. CBC: Recent Labs    09/29/17 1127  01/29/18 0952 01/30/18 2009 02/01/18 0846 02/02/18 0627  WBC 13.5*   < > 10.3 11.1* 8.3 8.4  NEUTROABS 11.4*  --  8.6* 8.0*  --   --   HGB 13.8   < > 13.5 12.0 12.8 12.3  HCT 42.6   < > 41.3 38.3 39.4 37.4  MCV 95.1   < > 95.6 98.5 95.4 93.5  PLT 252   < > 242 241 230 268   < > = values in this interval not displayed.   Lipid No results for input(s): CHOL, HDL, LDLCALC, TRIG in the last 8760 hours.  Cardiac Enzymes: Recent Labs    09/30/17 0229 09/30/17 0728 10/02/17 0627 10/03/17 0739 01/29/18 0952 01/30/18 1724  CKTOTAL  --  3,002* 1,109* 544*  --  176  TROPONINI 0.88* 0.73*  --   --  <0.03  --    BNP: No results for input(s): BNP in the last 8760 hours. No results found for: MICROALBUR No results found for: HGBA1C Lab Results  Component Value Date   TSH 4.553 (H) 01/30/2018   No results found for: VITAMINB12 No results found for: FOLATE No results found for: IRON, TIBC, FERRITIN  Imaging and Procedures obtained prior to SNF admission: Dg Chest Port 1 View  Result Date: 01/30/2018 CLINICAL DATA:  84 year old found lethargic and unresponsive at the nursing home. Hypotensive upon EMS arrival (60/30). EXAM:  PORTABLE CHEST 1 VIEW COMPARISON:  01/29/2018, 09/29/2017. FINDINGS: Cardiac silhouette moderately to markedly enlarged. Thoracic aorta tortuous and atherosclerotic. Interval development of mild diffuse interstitial pulmonary edema. More focal patchy airspace opacities at the LEFT lung base. No focal airspace opacities elsewhere. IMPRESSION: 1. Stable moderate to marked cardiomegaly. Mild diffuse interstitial pulmonary edema indicates mild CHF and/or fluid overload. 2. Focal airspace opacities at the LEFT lung base, query superimposed pneumonia. Electronically Signed   By: Hulan Saashomas  Lawrence M.D.   On: 01/30/2018 18:04     Not all labs, radiology exams or other studies done during hospitalization come through on my EPIC note; however they are reviewed by me.    Assessment and Plan  Hematoma left leg/cellulitis- after sterile prep and skin hematoma was opened with 15 blade- serosanguineous fluid returned and revealed that there was a second fluctuant mass underneath; this area was prepped and anesthetized with lidocaine and opened with return of blood and a very very large amount of clot extending up patient's leg for 10 cm; area was swept clear of clots, packed loosely and dressed.  Patient is already on doxycycline for pneumonia,, will extend the doxycycline for 7 more days and add rifampin 150 mg every 12 for 7 more days based on patient's calculated creatinine clearance of 43.32.  Will use Tylenol 650 mg 3 times daily elevation and dressing changes; the goal would be to have area drain; patient is aware and understands  No problem-specific Assessment & Plan notes found for this encounter.   Randon GoldsmithAnne D. Lyn HollingsheadAlexander, MD

## 2018-02-12 ENCOUNTER — Encounter: Payer: Self-pay | Admitting: Internal Medicine

## 2018-02-12 ENCOUNTER — Non-Acute Institutional Stay (SKILLED_NURSING_FACILITY): Payer: Medicare Other | Admitting: Internal Medicine

## 2018-02-12 ENCOUNTER — Other Ambulatory Visit: Payer: Self-pay | Admitting: *Deleted

## 2018-02-12 DIAGNOSIS — Z0189 Encounter for other specified special examinations: Secondary | ICD-10-CM

## 2018-02-12 DIAGNOSIS — S8012XA Contusion of left lower leg, initial encounter: Secondary | ICD-10-CM

## 2018-02-12 DIAGNOSIS — L03116 Cellulitis of left lower limb: Secondary | ICD-10-CM

## 2018-02-12 DIAGNOSIS — Z7689 Persons encountering health services in other specified circumstances: Secondary | ICD-10-CM

## 2018-02-12 NOTE — Patient Outreach (Signed)
[facility]) Care Management  02/12/2018  Madeline Parrish 1922-05-05 [id number]   [facility]) Care Management Post-Acute Care Coordination  02/12/2018  Madeline Parrish 06/22/1922 [id number]   Met with Katrinka Blazing, LCSW for [facility]. Reviewed patient case. She confirms that patient is a [facility] resident of facility and there are no plans to discharge at this time.  RNCM will sign off case. No [facility] care management needs identified.  Royetta Crochet. Laymond Purser, RN, BSN, [place] Post-Acute Care Coordinator [phone]

## 2018-02-12 NOTE — Progress Notes (Signed)
:  Location:  [facility] Room Number: 205D Place of Service:  SNF (31)  Madeline Parrish. [name], MD  Patient Care Team: Via, Caryn Bee, MD as PCP - General [facility])  Extended Emergency Contact Information Primary Emergency Contact: McNeely,Richard Address: [address]          [place], [place] [address] [place] Home Phone: [phone] Relation: Brother     Allergies: Patient has no known allergies.  Chief Complaint  Patient presents with  . Acute Visit    IND Impacting    HPI: Patient is 84 y.o. female who is being seen in follow-up for I&D that was done late last Friday.  The wound care nurse is not sure if the redness is better.  Patient denies any pain, and there is been no fever reported.  There is still an area of fluctuance medially and superior to the last area of incision and drainage which may be a problem.  Overall the area of redness to the leg does look better to me but certainly not gone.  Past Medical History:  Diagnosis Date  . Acne rosacea   . CKD (chronic kidney disease), stage III (HCC)   . Depression   . Hypertension   . Osteopenia   . PUD (peptic ulcer disease)   . Restrictive lung disease   . Shingles   . Tinnitus    and vertigo    History reviewed. No pertinent surgical history.  Allergies as of 02/12/2018   No Known Allergies     Medication List        Accurate as of 02/12/18  2:34 PM. Always use your most recent med list.          CENTRUM SILVER 50+WOMEN Tabs Take 1 tablet by mouth daily.   furosemide 20 MG tablet Commonly known as:  LASIX Take 20 mg by mouth every other day.   losartan 25 MG tablet Commonly known as:  COZAAR Take 25 mg by mouth daily.   metoprolol succinate 25 MG 24 hr tablet Commonly known as:  TOPROL-XL Take 25 mg by mouth daily.   NON FORMULARY Medpass : Give 120 ml by mouth twice daily as suplement   potassium chloride 10 MEQ tablet Commonly known  as:  K-DUR,KLOR-CON Take 10 mEq by mouth every other day.   rifampin 150 MG capsule Commonly known as:  RIFADIN Take 150 mg by mouth daily. take 1 capsule po every 12hours x 7 days for cellulitis       No orders of the defined types were placed in this encounter.   Immunization History  Administered Date(s) Administered  . Influenza, High Dose Seasonal PF 03/31/2017    Social History   Tobacco Use  . Smoking status: Never Smoker  . Smokeless tobacco: Never Used  Substance Use Topics  . Alcohol use: No    Family history is   Family History  Problem Relation Age of Onset  . CVA Mother       Review of Systems  DATA OBTAINED: from patient, nurse GENERAL:  no fevers, fatigue, appetite changes SKIN: Still redness to anterior leg but no heat and not tender to palpation EYES: No eye pain, redness, discharge EARS: No earache, tinnitus, change in hearing NOSE: No congestion, drainage or bleeding  MOUTH/THROAT: No mouth or tooth pain, No sore throat RESPIRATORY: No cough, wheezing, SOB CARDIAC: No chest pain, palpitations, lower extremity edema  GI: No abdominal pain, No  N/V/D or constipation, No heartburn or reflux  GU: No dysuria, frequency or urgency, or incontinence  MUSCULOSKELETAL: No unrelieved bone/joint pain NEUROLOGIC: No headache, dizziness or focal weakness PSYCHIATRIC: No c/o anxiety or sadness   Vitals:   02/12/18 1430  BP: 130/72  Pulse: 82  Resp: 18  Temp: (!) 97.5 F (36.4 C)    SpO2 Readings from Last 1 Encounters:  02/02/18 97%   Body mass index is 18.88 kg/m.     Physical Exam  GENERAL APPEARANCE: Alert, conversant,  No acute distress.  SKIN: Approximately 2.5 cm area fluctuant's medial and superior to her prior area of incision and drainage: Skin is still with some erythema but much less so and minimally tender to palpation HEAD: Normocephalic, atraumatic  EYES: Conjunctiva/lids clear. Pupils round, reactive. EOMs intact.  EARS:  External exam WNL, canals clear. Hearing grossly normal.  NOSE: No deformity or discharge.  MOUTH/THROAT: Lips w/o lesions  RESPIRATORY: Breathing is even, unlabored. Lung sounds are clear   CARDIOVASCULAR: Heart RRR no murmurs, rubs or gallops. No peripheral edema.   GASTROINTESTINAL: Abdomen is soft, non-tender, not distended w/ normal bowel sounds. GENITOURINARY: Bladder non tender, not distended  MUSCULOSKELETAL: No abnormal joints or musculature NEUROLOGIC:  Cranial nerves 2-12 grossly intact. Moves all extremities  PSYCHIATRIC: Mood and affect appropriate to situation, no behavioral issues  Patient Active Problem List   Diagnosis Date Noted  . Pneumonia of lower lobe of lung (HCC) 02/09/2018  . Delirium 02/09/2018  . Chronic diastolic (congestive) heart failure (HCC) 02/09/2018  . Hematoma of left lower extremity 02/09/2018  . Acute on chronic diastolic CHF (congestive heart failure) (HCC) 01/30/2018  . Sepsis due to pneumonia (HCC) 01/30/2018  . Agitation 01/30/2018  . Right leg injury, subsequent encounter 01/30/2018  . Benign hypertension with chronic kidney disease, stage III (HCC) 10/06/2017  . Chronic kidney disease, stage III (moderate) (HCC) 10/06/2017  . Restrictive airway disease 10/06/2017  . Fall 09/29/2017  . Acute renal failure superimposed on stage 3 chronic kidney disease (HCC) 09/29/2017  . Bilateral lower extremity edema 08/15/2015  . Paroxysmal supraventricular tachycardia (HCC) 12/06/2013  . Essential hypertension, benign 12/06/2013  . Syncope and collapse 12/06/2013      Labs reviewed: Basic Metabolic Panel:    Component Value Date/Time   NA 145 02/02/2018 0627   NA 147 10/17/2017   K 3.1 (L) 02/02/2018 0627   CL 109 02/02/2018 0627   CO2 27 02/02/2018 0627   GLUCOSE 95 02/02/2018 0627   BUN 11 02/02/2018 0627   BUN 22 (A) 10/17/2017   CREATININE 0.94 02/02/2018 0627   CREATININE 1.07 (H) 08/15/2015 1422   CALCIUM 8.6 (L) 02/02/2018 0627    PROT 5.1 (L) 01/30/2018 1724   ALBUMIN 3.0 (L) 01/30/2018 1724   AST 43 (H) 01/30/2018 1724   ALT 15 01/30/2018 1724   ALKPHOS 55 01/30/2018 1724   BILITOT 1.8 (H) 01/30/2018 1724   GFRNONAA 50 (L) 02/02/2018 0627   GFRAA 58 (L) 02/02/2018 0627    Recent Labs    01/30/18 1724 02/01/18 0846 02/02/18 0627  NA 142 143 145  K 5.6* 3.2* 3.1*  CL 109 109 109  CO2 18* 25 27  GLUCOSE 115* 99 95  BUN 29* 16 11  CREATININE 1.84* 0.96 0.94  CALCIUM 8.4* 8.6* 8.6*   Liver Function Tests: Recent Labs    09/30/17 0728 01/29/18 0952 01/30/18 1724  AST 108* 17 43*  ALT 36 12 15  ALKPHOS 43 51 55  BILITOT 1.1 0.8 1.8*  PROT 5.0* 5.6* 5.1*  ALBUMIN 2.5* 3.0* 3.0*   No results for input(s): LIPASE, AMYLASE in the last 8760 hours. No results for input(s): AMMONIA in the last 8760 hours. CBC: Recent Labs    09/29/17 1127  01/29/18 0952 01/30/18 2009 02/01/18 0846 02/02/18 0627  WBC 13.5*   < > 10.3 11.1* 8.3 8.4  NEUTROABS 11.4*  --  8.6* 8.0*  --   --   HGB 13.8   < > 13.5 12.0 12.8 12.3  HCT 42.6   < > 41.3 38.3 39.4 37.4  MCV 95.1   < > 95.6 98.5 95.4 93.5  PLT 252   < > 242 241 230 268   < > = values in this interval not displayed.   Lipid No results for input(s): CHOL, HDL, LDLCALC, TRIG in the last 8760 hours.  Cardiac Enzymes: Recent Labs    09/30/17 0229 09/30/17 0728 10/02/17 0627 10/03/17 0739 01/29/18 0952 01/30/18 1724  CKTOTAL  --  3,002* 1,109* 544*  --  176  TROPONINI 0.88* 0.73*  --   --  <0.03  --    BNP: No results for input(s): BNP in the last 8760 hours. No results found for: MICROALBUR No results found for: HGBA1C Lab Results  Component Value Date   TSH 4.553 (H) 01/30/2018   No results found for: VITAMINB12 No results found for: FOLATE No results found for: IRON, TIBC, FERRITIN  Imaging and Procedures obtained prior to SNF admission: Dg Chest Port 1 View  Result Date: 01/30/2018 CLINICAL DATA:  84 year old found lethargic and  unresponsive at the nursing home. Hypotensive upon EMS arrival (60/30). EXAM: PORTABLE CHEST 1 VIEW COMPARISON:  01/29/2018, 09/29/2017. FINDINGS: Cardiac silhouette moderately to markedly enlarged. Thoracic aorta tortuous and atherosclerotic. Interval development of mild diffuse interstitial pulmonary edema. More focal patchy airspace opacities at the LEFT lung base. No focal airspace opacities elsewhere. IMPRESSION: 1. Stable moderate to marked cardiomegaly. Mild diffuse interstitial pulmonary edema indicates mild CHF and/or fluid overload. 2. Focal airspace opacities at the LEFT lung base, query superimposed pneumonia. Electronically Signed   By: Hulan Saashomas  Lawrence M.D.   On: 01/30/2018 18:04     Not all labs, radiology exams or other studies done during hospitalization come through on my EPIC note; however they are reviewed by me.    Assessment and Plan  Abscess versus hematoma/cellulitis to leg- prior area of I&D looks good, skin has better color is no longer packed.  Procedure: The area of fluctuance was sterilized, and injected with lidocaine and incision was made.  There was no drainage of blood but there is clot in this area also is communicating with the prior area of incision and draining in the cavity that was underneath the skin.  This area is still full of blood clots and these were swept away digitally.  The area was packed through the new area of I&D into the proximal area of the prior blood clot cavity with iodoform gauze, packing to be pulled on Monday.  Continue antibiotics.  Packing is to be pulled on Monday.  External dressing is to be replaced daily.  Patient is going to have a shower today which is excellent, it is packed with iodoform gauze and the moist heat will be of benefit.   Madeline GoldsmithAnne D. Lyn HollingsheadAlexander, MD

## 2018-02-15 ENCOUNTER — Encounter: Payer: Self-pay | Admitting: Internal Medicine

## 2018-02-16 DIAGNOSIS — T8189XD Other complications of procedures, not elsewhere classified, subsequent encounter: Secondary | ICD-10-CM | POA: Diagnosis not present

## 2018-02-16 DIAGNOSIS — T8189XA Other complications of procedures, not elsewhere classified, initial encounter: Secondary | ICD-10-CM | POA: Diagnosis not present

## 2018-02-20 ENCOUNTER — Encounter: Payer: Self-pay | Admitting: Internal Medicine

## 2018-02-23 DIAGNOSIS — T8189XD Other complications of procedures, not elsewhere classified, subsequent encounter: Secondary | ICD-10-CM | POA: Diagnosis not present

## 2018-02-23 DIAGNOSIS — T8189XA Other complications of procedures, not elsewhere classified, initial encounter: Secondary | ICD-10-CM | POA: Diagnosis not present

## 2018-03-02 DIAGNOSIS — T8189XA Other complications of procedures, not elsewhere classified, initial encounter: Secondary | ICD-10-CM | POA: Diagnosis not present

## 2018-03-02 DIAGNOSIS — T8189XD Other complications of procedures, not elsewhere classified, subsequent encounter: Secondary | ICD-10-CM | POA: Diagnosis not present

## 2018-03-03 ENCOUNTER — Encounter: Payer: Self-pay | Admitting: Internal Medicine

## 2018-03-03 ENCOUNTER — Non-Acute Institutional Stay (SKILLED_NURSING_FACILITY): Payer: Medicare Other | Admitting: Internal Medicine

## 2018-03-03 DIAGNOSIS — S8012XD Contusion of left lower leg, subsequent encounter: Secondary | ICD-10-CM

## 2018-03-03 DIAGNOSIS — I129 Hypertensive chronic kidney disease with stage 1 through stage 4 chronic kidney disease, or unspecified chronic kidney disease: Secondary | ICD-10-CM

## 2018-03-03 DIAGNOSIS — I5032 Chronic diastolic (congestive) heart failure: Secondary | ICD-10-CM | POA: Diagnosis not present

## 2018-03-03 DIAGNOSIS — N183 Chronic kidney disease, stage 3 unspecified: Secondary | ICD-10-CM

## 2018-03-03 NOTE — Progress Notes (Signed)
Location:  [facility] Room Number: 205D Place of Service:  SNF [phone])  Madeline Parrish. [name], MD  Patient Care Team: Via, Caryn Bee, MD as PCP - General [facility] Medicine)  Extended Emergency Contact Information Primary Emergency Contact: McNeely,Richard Address: [address]          [place], [place] [address] [place] Home Phone: [phone] Relation: Brother    Allergies: Patient has no known allergies.  Chief Complaint  Patient presents with  . Medical Management of Chronic Issues    Routine Visit    HPI: Patient is 84 y.o. female who is being seen for routine issues of hematoma of leg, diastolic congestive heart failure, and hypertension.  Past Medical History:  Diagnosis Date  . Acne rosacea   . CKD (chronic kidney disease), stage III (HCC)   . Depression   . Hypertension   . Osteopenia   . PUD (peptic ulcer disease)   . Restrictive lung disease   . Shingles   . Tinnitus    and vertigo    History reviewed. No pertinent surgical history.  Allergies as of 03/03/2018   No Known Allergies     Medication List        Accurate as of 03/03/18 11:59 PM. Always use your most recent med list.          CENTRUM SILVER 50+WOMEN Tabs Take 1 tablet by mouth daily.   furosemide 20 MG tablet Commonly known as:  LASIX Take 20 mg by mouth every other day.   losartan 25 MG tablet Commonly known as:  COZAAR Take 25 mg by mouth daily.   metoprolol succinate 25 MG 24 hr tablet Commonly known as:  TOPROL-XL Take 25 mg by mouth daily.   mirtazapine 7.5 MG tablet Commonly known as:  REMERON Take 7.5 mg by mouth. TAKE 1 TABLET BY MOUTH AT BEDTIME FOR APPETITE STIMULANT   potassium chloride 10 MEQ tablet Commonly known as:  K-DUR,KLOR-CON Take 10 mEq by mouth every other day.       No orders of the defined types were placed in this encounter.   Immunization History  Administered Date(s) Administered  . Influenza, High  Dose Seasonal PF 03/31/2017    Social History   Tobacco Use  . Smoking status: Never Smoker  . Smokeless tobacco: Never Used  Substance Use Topics  . Alcohol use: No    Review of Systems  DATA OBTAINED: from patient, nurse GENERAL:  no fevers, fatigue, appetite changes SKIN: No itching, rash HEENT: No complaint RESPIRATORY: No cough, wheezing, SOB CARDIAC: No chest pain, palpitations, lower extremity edema  GI: No abdominal pain, No N/V/D or constipation, No heartburn or reflux  GU: No dysuria, frequency or urgency, or incontinence  MUSCULOSKELETAL: No unrelieved bone/joint pain NEUROLOGIC: No headache, dizziness  PSYCHIATRIC: No overt anxiety or sadness  Vitals:   03/03/18 1453  BP: 100/70  Pulse: 68  Resp: 18  Temp: 98 F (36.7 C)   Body mass index is 19.22 kg/m. Physical Exam  GENERAL APPEARANCE: Alert, conversant, No acute distress  SKIN: No diaphoresis rash HEENT: Unremarkable RESPIRATORY: Breathing is even, unlabored. Lung sounds are clear   CARDIOVASCULAR: Heart RRR no murmurs, rubs or gallops. No peripheral edema  GASTROINTESTINAL: Abdomen is soft, non-tender, not distended w/ normal bowel sounds.  GENITOURINARY: Bladder non tender, not distended  MUSCULOSKELETAL: No abnormal joints or musculature; left leg dressed NEUROLOGIC: Cranial nerves 2-12 grossly intact. Moves all extremities PSYCHIATRIC:  Mood and affect appropriate to situation with dementia, no behavioral issues  Patient Active Problem List   Diagnosis Date Noted  . Pneumonia of lower lobe of lung (HCC) 02/09/2018  . Delirium 02/09/2018  . Chronic diastolic (congestive) heart failure (HCC) 02/09/2018  . Hematoma of left lower extremity 02/09/2018  . Acute on chronic diastolic CHF (congestive heart failure) (HCC) 01/30/2018  . Sepsis due to pneumonia (HCC) 01/30/2018  . Agitation 01/30/2018  . Right leg injury, subsequent encounter 01/30/2018  . Benign hypertension with chronic kidney  disease, stage III (HCC) 10/06/2017  . Chronic kidney disease, stage III (moderate) (HCC) 10/06/2017  . Restrictive airway disease 10/06/2017  . Fall 09/29/2017  . Acute renal failure superimposed on stage 3 chronic kidney disease (HCC) 09/29/2017  . Bilateral lower extremity edema 08/15/2015  . Paroxysmal supraventricular tachycardia (HCC) 12/06/2013  . Essential hypertension, benign 12/06/2013  . Syncope and collapse 12/06/2013    CMP     Component Value Date/Time   NA 145 02/02/2018 0627   NA 147 10/17/2017   K 3.1 (L) 02/02/2018 0627   CL 109 02/02/2018 0627   CO2 27 02/02/2018 0627   GLUCOSE 95 02/02/2018 0627   BUN 11 02/02/2018 0627   BUN 22 (A) 10/17/2017   CREATININE 0.94 02/02/2018 0627   CREATININE 1.07 (H) 08/15/2015 1422   CALCIUM 8.6 (L) 02/02/2018 0627   PROT 5.1 (L) 01/30/2018 1724   ALBUMIN 3.0 (L) 01/30/2018 1724   AST 43 (H) 01/30/2018 1724   ALT 15 01/30/2018 1724   ALKPHOS 55 01/30/2018 1724   BILITOT 1.8 (H) 01/30/2018 1724   GFRNONAA 50 (L) 02/02/2018 0627   GFRAA 58 (L) 02/02/2018 0627   Recent Labs    01/30/18 1724 02/01/18 0846 02/02/18 0627  NA 142 143 145  K 5.6* 3.2* 3.1*  CL 109 109 109  CO2 18* 25 27  GLUCOSE 115* 99 95  BUN 29* 16 11  CREATININE 1.84* 0.96 0.94  CALCIUM 8.4* 8.6* 8.6*   Recent Labs    09/30/17 0728 01/29/18 0952 01/30/18 1724  AST 108* 17 43*  ALT 36 12 15  ALKPHOS 43 51 55  BILITOT 1.1 0.8 1.8*  PROT 5.0* 5.6* 5.1*  ALBUMIN 2.5* 3.0* 3.0*   Recent Labs    09/29/17 1127  01/29/18 0952 01/30/18 2009 02/01/18 0846 02/02/18 0627  WBC 13.5*   < > 10.3 11.1* 8.3 8.4  NEUTROABS 11.4*  --  8.6* 8.0*  --   --   HGB 13.8   < > 13.5 12.0 12.8 12.3  HCT 42.6   < > 41.3 38.3 39.4 37.4  MCV 95.1   < > 95.6 98.5 95.4 93.5  PLT 252   < > 242 241 230 268   < > = values in this interval not displayed.   No results for input(s): CHOL, LDLCALC, TRIG in the last 8760 hours.  Invalid input(s): HCL No results  found for: [facility] Lab Results  Component Value Date   TSH 4.553 (H) 01/30/2018   No results found for: HG[facility]1C Lab Results  Component Value Date   CHOL 167 08/15/2015   HDL 65 08/15/2015   LDLCALC 87 08/15/2015   TRIG 76 08/15/2015   CHOLHDL 2.6 08/15/2015    Significant Diagnostic Results in last 30 days:  No results found.  Assessment and Plan  Hematoma of left lower extremity Status post I&D x2; reported to be healing nicely; no infection or pain to patient: Wound care will  follow until complete resolution  Chronic diastolic (congestive) heart failure (HCC) Chronic and stable; continue Toprol-XL 25 mg daily, Lasix 20 mg every other day, and Cozaar 25 mg daily  Benign hypertension with chronic kidney disease, stage III (HCC) Controlled; continue Lasix 20 mg every other day, Cozaar 25 mg daily and Toprol-XL 25 mg daily     Cyndel Griffey D. [name], MD

## 2018-03-08 ENCOUNTER — Encounter: Payer: Self-pay | Admitting: Internal Medicine

## 2018-03-08 NOTE — Assessment & Plan Note (Signed)
Chronic and stable; continue Toprol-XL 25 mg daily, Lasix 20 mg every other day, and Cozaar 25 mg daily

## 2018-03-08 NOTE — Assessment & Plan Note (Signed)
Status post I&D x2; reported to be healing nicely; no infection or pain to patient: Wound care will follow until complete resolution

## 2018-03-08 NOTE — Assessment & Plan Note (Signed)
Controlled; continue Lasix 20 mg every other day, Cozaar 25 mg daily and Toprol-XL 25 mg daily

## 2018-03-09 DIAGNOSIS — T8189XA Other complications of procedures, not elsewhere classified, initial encounter: Secondary | ICD-10-CM | POA: Diagnosis not present

## 2018-03-09 DIAGNOSIS — T8189XD Other complications of procedures, not elsewhere classified, subsequent encounter: Secondary | ICD-10-CM | POA: Diagnosis not present

## 2018-03-16 DIAGNOSIS — T8189XA Other complications of procedures, not elsewhere classified, initial encounter: Secondary | ICD-10-CM | POA: Diagnosis not present

## 2018-03-16 DIAGNOSIS — T8189XD Other complications of procedures, not elsewhere classified, subsequent encounter: Secondary | ICD-10-CM | POA: Diagnosis not present

## 2018-03-17 DIAGNOSIS — Z23 Encounter for immunization: Secondary | ICD-10-CM | POA: Diagnosis not present

## 2018-03-20 DIAGNOSIS — F0390 Unspecified dementia without behavioral disturbance: Secondary | ICD-10-CM | POA: Diagnosis not present

## 2018-03-20 DIAGNOSIS — Z9181 History of falling: Secondary | ICD-10-CM | POA: Diagnosis not present

## 2018-03-23 DIAGNOSIS — T8189XD Other complications of procedures, not elsewhere classified, subsequent encounter: Secondary | ICD-10-CM | POA: Diagnosis not present

## 2018-03-23 DIAGNOSIS — T8189XA Other complications of procedures, not elsewhere classified, initial encounter: Secondary | ICD-10-CM | POA: Diagnosis not present

## 2018-03-30 DIAGNOSIS — T8189XD Other complications of procedures, not elsewhere classified, subsequent encounter: Secondary | ICD-10-CM | POA: Diagnosis not present

## 2018-03-30 DIAGNOSIS — T8189XA Other complications of procedures, not elsewhere classified, initial encounter: Secondary | ICD-10-CM | POA: Diagnosis not present

## 2018-04-01 ENCOUNTER — Non-Acute Institutional Stay (SKILLED_NURSING_FACILITY): Payer: Medicare Other | Admitting: Internal Medicine

## 2018-04-01 ENCOUNTER — Encounter: Payer: Self-pay | Admitting: Internal Medicine

## 2018-04-01 DIAGNOSIS — N183 Chronic kidney disease, stage 3 (moderate): Secondary | ICD-10-CM | POA: Diagnosis not present

## 2018-04-01 DIAGNOSIS — S8012XD Contusion of left lower leg, subsequent encounter: Secondary | ICD-10-CM | POA: Diagnosis not present

## 2018-04-01 DIAGNOSIS — I129 Hypertensive chronic kidney disease with stage 1 through stage 4 chronic kidney disease, or unspecified chronic kidney disease: Secondary | ICD-10-CM | POA: Diagnosis not present

## 2018-04-01 DIAGNOSIS — I5032 Chronic diastolic (congestive) heart failure: Secondary | ICD-10-CM

## 2018-04-01 NOTE — Progress Notes (Signed)
Location:  [facility] Room Number: 205D Place of Service:  SNF [phone])  Madeline Parrish. Madeline Hollingshead, MD  Patient Care Team: Via, Caryn Bee, MD as PCP - General [facility] Medicine)  Extended Emergency Contact Information Primary Emergency Contact: McNeely,Richard Address: [address]          [place], [place] [address] [place] Home Phone: [phone] Relation: Brother    Allergies: Patient has no known allergies.  Chief Complaint  Patient presents with  . Medical Management of Chronic Issues    Routine Visit  . Health Maintenance    Influenza vacc.    HPI: Patient is 84 y.o. female who is being seen for routine issues of diastolic congestive heart failure, hypertension, and hematoma left calf.  Past Medical History:  Diagnosis Date  . Acne rosacea   . CKD (chronic kidney disease), stage III (HCC)   . Depression   . Hypertension   . Osteopenia   . PUD (peptic ulcer disease)   . Restrictive lung disease   . Shingles   . Tinnitus    and vertigo    History reviewed. No pertinent surgical history.  Allergies as of 04/01/2018   No Known Allergies     Medication List        Accurate as of 04/01/18 11:59 PM. Always use your most recent med list.          CENTRUM SILVER 50+WOMEN Tabs Take 1 tablet by mouth daily.   furosemide 20 MG tablet Commonly known as:  LASIX Take 20 mg by mouth every other day.   metoprolol succinate 25 MG 24 hr tablet Commonly known as:  TOPROL-XL Take 25 mg by mouth daily.   mirtazapine 7.5 MG tablet Commonly known as:  REMERON Take 7.5 mg by mouth. TAKE 1 TABLET BY MOUTH AT BEDTIME FOR APPETITE STIMULANT   potassium chloride 10 MEQ tablet Commonly known as:  K-DUR,KLOR-CON Take 10 mEq by mouth every other day.       No orders of the defined types were placed in this encounter.   Immunization History  Administered Date(s) Administered  . Influenza, High Dose Seasonal PF 03/31/2017     Social History   Tobacco Use  . Smoking status: Never Smoker  . Smokeless tobacco: Never Used  Substance Use Topics  . Alcohol use: No    Review of Systems  DATA OBTAINED: from patient, nurse GENERAL:  no fevers, fatigue, appetite changes SKIN: No itching, rash HEENT: No complaint RESPIRATORY: No cough, wheezing, SOB CARDIAC: No chest pain, palpitations, lower extremity edema  GI: No abdominal pain, No N/V/D or constipation, No heartburn or reflux  GU: No dysuria, frequency or urgency, or incontinence  MUSCULOSKELETAL: No unrelieved bone/joint pain NEUROLOGIC: No headache, dizziness  PSYCHIATRIC: No overt anxiety or sadness  Vitals:   04/01/18 1047  BP: 128/70  Pulse: 88  Resp: 18  Temp: 98.2 F (36.8 C)   Body mass index is 18.92 kg/m. Physical Exam  GENERAL APPEARANCE: Alert, conversant, No acute distress  SKIN: No diaphoresis rash HEENT: Unremarkable RESPIRATORY: Breathing is even, unlabored. Lung sounds are clear   CARDIOVASCULAR: Heart RRR no murmurs, rubs or gallops. No peripheral edema  GASTROINTESTINAL: Abdomen is soft, non-tender, not distended w/ normal bowel sounds.  GENITOURINARY: Bladder non tender, not distended  MUSCULOSKELETAL: No abnormal joints or musculature NEUROLOGIC: Cranial nerves 2-12 grossly intact. Moves all extremities PSYCHIATRIC: Mood and affect appropriate with dementia, no behavioral issues  Patient  Active Problem List   Diagnosis Date Noted  . Pneumonia of lower lobe of lung (HCC) 02/09/2018  . Delirium 02/09/2018  . Chronic diastolic (congestive) heart failure (HCC) 02/09/2018  . Hematoma of left lower extremity 02/09/2018  . Acute on chronic diastolic CHF (congestive heart failure) (HCC) 01/30/2018  . Sepsis due to pneumonia (HCC) 01/30/2018  . Agitation 01/30/2018  . Right leg injury, subsequent encounter 01/30/2018  . Benign hypertension with chronic kidney disease, stage III (HCC) 10/06/2017  . Chronic kidney disease,  stage III (moderate) (HCC) 10/06/2017  . Restrictive airway disease 10/06/2017  . Fall 09/29/2017  . Acute renal failure superimposed on stage 3 chronic kidney disease (HCC) 09/29/2017  . Bilateral lower extremity edema 08/15/2015  . Paroxysmal supraventricular tachycardia (HCC) 12/06/2013  . Essential hypertension, benign 12/06/2013  . Syncope and collapse 12/06/2013    CMP     Component Value Date/Time   NA 145 02/02/2018 0627   NA 147 10/17/2017   K 3.1 (L) 02/02/2018 0627   CL 109 02/02/2018 0627   CO2 27 02/02/2018 0627   GLUCOSE 95 02/02/2018 0627   BUN 11 02/02/2018 0627   BUN 22 (A) 10/17/2017   CREATININE 0.94 02/02/2018 0627   CREATININE 1.07 (H) 08/15/2015 1422   CALCIUM 8.6 (L) 02/02/2018 0627   PROT 5.1 (L) 01/30/2018 1724   ALBUMIN 3.0 (L) 01/30/2018 1724   AST 43 (H) 01/30/2018 1724   ALT 15 01/30/2018 1724   ALKPHOS 55 01/30/2018 1724   BILITOT 1.8 (H) 01/30/2018 1724   GFRNONAA 50 (L) 02/02/2018 0627   GFRAA 58 (L) 02/02/2018 0627   Recent Labs    01/30/18 1724 02/01/18 0846 02/02/18 0627  NA 142 143 145  K 5.6* 3.2* 3.1*  CL 109 109 109  CO2 18* 25 27  GLUCOSE 115* 99 95  BUN 29* 16 11  CREATININE 1.84* 0.96 0.94  CALCIUM 8.4* 8.6* 8.6*   Recent Labs    09/30/17 0728 01/29/18 0952 01/30/18 1724  AST 108* 17 43*  ALT 36 12 15  ALKPHOS 43 51 55  BILITOT 1.1 0.8 1.8*  PROT 5.0* 5.6* 5.1*  ALBUMIN 2.5* 3.0* 3.0*   Recent Labs    09/29/17 1127  01/29/18 0952 01/30/18 2009 02/01/18 0846 02/02/18 0627  WBC 13.5*   < > 10.3 11.1* 8.3 8.4  NEUTROABS 11.4*  --  8.6* 8.0*  --   --   HGB 13.8   < > 13.5 12.0 12.8 12.3  HCT 42.6   < > 41.3 38.3 39.4 37.4  MCV 95.1   < > 95.6 98.5 95.4 93.5  PLT 252   < > 242 241 230 268   < > = values in this interval not displayed.   No results for input(s): CHOL, LDLCALC, TRIG in the last 8760 hours.  Invalid input(s): HCL No results found for: [facility] Lab Results  Component Value Date   TSH  4.553 (H) 01/30/2018   No results found for: [facility] Lab Results  Component Value Date   CHOL 167 08/15/2015   HDL 65 08/15/2015   LDLCALC 87 08/15/2015   TRIG 76 08/15/2015   CHOLHDL 2.6 08/15/2015    Significant Diagnostic Results in last 30 days:  No results found.  Assessment and Plan  Chronic diastolic (congestive) heart failure (HCC) Table without exacerbations; continue Toprol-XL 25 mg daily, Lasix 20 mg every other day and Cozaar 25 mg daily  Benign hypertension with chronic kidney disease, stage III (HCC) Controlled;  Cont troprol XL 25 mg daily, cozaar 25 mg daily and lasix QOD  Hematoma of left lower extremity Leg  looks really good,healed     Madeline Parrish D. Madeline Hollingshead, MD

## 2018-04-06 DIAGNOSIS — T8189XA Other complications of procedures, not elsewhere classified, initial encounter: Secondary | ICD-10-CM | POA: Diagnosis not present

## 2018-04-06 DIAGNOSIS — T8189XD Other complications of procedures, not elsewhere classified, subsequent encounter: Secondary | ICD-10-CM | POA: Diagnosis not present

## 2018-04-13 DIAGNOSIS — T8189XA Other complications of procedures, not elsewhere classified, initial encounter: Secondary | ICD-10-CM | POA: Diagnosis not present

## 2018-04-13 DIAGNOSIS — T8189XD Other complications of procedures, not elsewhere classified, subsequent encounter: Secondary | ICD-10-CM | POA: Diagnosis not present

## 2018-04-20 DIAGNOSIS — T8189XA Other complications of procedures, not elsewhere classified, initial encounter: Secondary | ICD-10-CM | POA: Diagnosis not present

## 2018-04-20 DIAGNOSIS — T8189XD Other complications of procedures, not elsewhere classified, subsequent encounter: Secondary | ICD-10-CM | POA: Diagnosis not present

## 2018-04-27 DIAGNOSIS — T8189XD Other complications of procedures, not elsewhere classified, subsequent encounter: Secondary | ICD-10-CM | POA: Diagnosis not present

## 2018-04-27 DIAGNOSIS — T8189XA Other complications of procedures, not elsewhere classified, initial encounter: Secondary | ICD-10-CM | POA: Diagnosis not present

## 2018-04-29 ENCOUNTER — Encounter: Payer: Self-pay | Admitting: Internal Medicine

## 2018-04-29 NOTE — Assessment & Plan Note (Signed)
Table without exacerbations; continue Toprol-XL 25 mg daily, Lasix 20 mg every other day and Cozaar 25 mg daily

## 2018-04-29 NOTE — Assessment & Plan Note (Signed)
Leg  looks really good,healed

## 2018-04-29 NOTE — Assessment & Plan Note (Signed)
Controlled; Cont troprol XL 25 mg daily, cozaar 25 mg daily and lasix QOD

## 2018-05-05 ENCOUNTER — Non-Acute Institutional Stay (SKILLED_NURSING_FACILITY): Payer: Medicare Other | Admitting: Internal Medicine

## 2018-05-05 ENCOUNTER — Encounter: Payer: Self-pay | Admitting: Internal Medicine

## 2018-05-05 DIAGNOSIS — J984 Other disorders of lung: Secondary | ICD-10-CM

## 2018-05-05 DIAGNOSIS — R600 Localized edema: Secondary | ICD-10-CM | POA: Diagnosis not present

## 2018-05-05 DIAGNOSIS — N183 Chronic kidney disease, stage 3 unspecified: Secondary | ICD-10-CM

## 2018-05-05 NOTE — Progress Notes (Signed)
Location:  [facility] Room Number: 205D Place of Service:  SNF [phone])  Madeline Parrish. [name], MD  Patient Care Team: Via, Caryn Bee, MD as PCP - General [facility] Medicine)  Extended Emergency Contact Information Primary Emergency Contact: McNeely,Richard Address: [address]          [place], [place] [address] [place] Home Phone: [phone] Relation: Brother    Allergies: Patient has no known allergies.  Chief Complaint  Patient presents with  . Medical Management of Chronic Issues    Routine Visit  . Health Maintenance    Influenza vacc.    HPI: Patient is 84 y.o. female who is being seen for routine issues of restrictive airway disease, chronic kidney disease stage III, and chronic lower extremity edema.  Past Medical History:  Diagnosis Date  . Acne rosacea   . CKD (chronic kidney disease), stage III (HCC)   . Depression   . Hypertension   . Osteopenia   . PUD (peptic ulcer disease)   . Restrictive lung disease   . Shingles   . Tinnitus    and vertigo    History reviewed. No pertinent surgical history.  Allergies as of 05/05/2018   No Known Allergies     Medication List        Accurate as of 05/05/18  8:01 PM. Always use your most recent med list.          CENTRUM SILVER 50+WOMEN Tabs Take 1 tablet by mouth daily.   furosemide 20 MG tablet Commonly known as:  LASIX Take 20 mg by mouth every other day.   metoprolol succinate 25 MG 24 hr tablet Commonly known as:  TOPROL-XL Take 25 mg by mouth daily.   mirtazapine 7.5 MG tablet Commonly known as:  REMERON Take 7.5 mg by mouth. TAKE 1 TABLET BY MOUTH AT BEDTIME FOR APPETITE STIMULANT   polyethylene glycol packet Commonly known as:  MIRALAX / GLYCOLAX Take 17 g by mouth daily.   potassium chloride 10 MEQ tablet Commonly known as:  K-DUR,KLOR-CON Take 10 mEq by mouth every other day.       No orders of the defined types were placed in this  encounter.   Immunization History  Administered Date(s) Administered  . Influenza, High Dose Seasonal PF 03/31/2017    Social History   Tobacco Use  . Smoking status: Never Smoker  . Smokeless tobacco: Never Used  Substance Use Topics  . Alcohol use: No    Review of Systems  DATA OBTAINED: from patient, nurse GENERAL:  no fevers, fatigue, appetite changes SKIN: No itching, rash HEENT: No complaint RESPIRATORY: No cough, wheezing, SOB CARDIAC: No chest pain, palpitations, lower extremity edema  GI: No abdominal pain, No N/V/D or constipation, No heartburn or reflux  GU: No dysuria, frequency or urgency, or incontinence  MUSCULOSKELETAL: No unrelieved bone/joint pain NEUROLOGIC: No headache, dizziness  PSYCHIATRIC: No overt anxiety or sadness  Vitals:   05/05/18 1049  BP: 122/70  Pulse: 78  Resp: 18  Temp: 97.9 F (36.6 C)   Body mass index is 19.26 kg/m. Physical Exam  GENERAL APPEARANCE: Alert, conversant, No acute distress  SKIN: No diaphoresis rash HEENT: Unremarkable RESPIRATORY: Breathing is even, unlabored. Lung sounds are clear   CARDIOVASCULAR: Heart RRR no murmurs, rubs or gallops. No peripheral edema  GASTROINTESTINAL: Abdomen is soft, non-tender, not distended w/ normal bowel sounds.  GENITOURINARY: Bladder non tender, not distended  MUSCULOSKELETAL: No abnormal joints  or musculature NEUROLOGIC: Cranial nerves 2-12 grossly intact. Moves all extremities PSYCHIATRIC: Mood and affect appropriate to situation dementia, no behavioral issues  Patient Active Problem List   Diagnosis Date Noted  . Pneumonia of lower lobe of lung (HCC) 02/09/2018  . Delirium 02/09/2018  . Chronic diastolic (congestive) heart failure (HCC) 02/09/2018  . Hematoma of left lower extremity 02/09/2018  . Acute on chronic diastolic CHF (congestive heart failure) (HCC) 01/30/2018  . Sepsis due to pneumonia (HCC) 01/30/2018  . Agitation 01/30/2018  . Right leg injury, subsequent  encounter 01/30/2018  . Benign hypertension with chronic kidney disease, stage III (HCC) 10/06/2017  . Chronic kidney disease, stage III (moderate) (HCC) 10/06/2017  . Restrictive airway disease 10/06/2017  . Fall 09/29/2017  . Acute renal failure superimposed on stage 3 chronic kidney disease (HCC) 09/29/2017  . Bilateral lower extremity edema 08/15/2015  . Paroxysmal supraventricular tachycardia (HCC) 12/06/2013  . Essential hypertension, benign 12/06/2013  . Syncope and collapse 12/06/2013    CMP     Component Value Date/Time   NA 145 02/02/2018 0627   NA 147 10/17/2017   K 3.1 (L) 02/02/2018 0627   CL 109 02/02/2018 0627   CO2 27 02/02/2018 0627   GLUCOSE 95 02/02/2018 0627   BUN 11 02/02/2018 0627   BUN 22 (A) 10/17/2017   CREATININE 0.94 02/02/2018 0627   CREATININE 1.07 (H) 08/15/2015 1422   CALCIUM 8.6 (L) 02/02/2018 0627   PROT 5.1 (L) 01/30/2018 1724   ALBUMIN 3.0 (L) 01/30/2018 1724   AST 43 (H) 01/30/2018 1724   ALT 15 01/30/2018 1724   ALKPHOS 55 01/30/2018 1724   BILITOT 1.8 (H) 01/30/2018 1724   GFRNONAA 50 (L) 02/02/2018 0627   GFRAA 58 (L) 02/02/2018 0627   Recent Labs    01/30/18 1724 02/01/18 0846 02/02/18 0627  NA 142 143 145  K 5.6* 3.2* 3.1*  CL 109 109 109  CO2 18* 25 27  GLUCOSE 115* 99 95  BUN 29* 16 11  CREATININE 1.84* 0.96 0.94  CALCIUM 8.4* 8.6* 8.6*   Recent Labs    09/30/17 0728 01/29/18 0952 01/30/18 1724  AST 108* 17 43*  ALT 36 12 15  ALKPHOS 43 51 55  BILITOT 1.1 0.8 1.8*  PROT 5.0* 5.6* 5.1*  ALBUMIN 2.5* 3.0* 3.0*   Recent Labs    09/29/17 1127  01/29/18 0952 01/30/18 2009 02/01/18 0846 02/02/18 0627  WBC 13.5*   < > 10.3 11.1* 8.3 8.4  NEUTROABS 11.4*  --  8.6* 8.0*  --   --   HGB 13.8   < > 13.5 12.0 12.8 12.3  HCT 42.6   < > 41.3 38.3 39.4 37.4  MCV 95.1   < > 95.6 98.5 95.4 93.5  PLT 252   < > 242 241 230 268   < > = values in this interval not displayed.   No results for input(s): CHOL, LDLCALC, TRIG  in the last 8760 hours.  Invalid input(s): HCL No results found for: [facility] Lab Results  Component Value Date   TSH 4.553 (H) 01/30/2018   No results found for: HG[facility]1C Lab Results  Component Value Date   CHOL 167 08/15/2015   HDL 65 08/15/2015   LDLCALC 87 08/15/2015   TRIG 76 08/15/2015   CHOLHDL 2.6 08/15/2015    Significant Diagnostic Results in last 30 days:  No results found.  Assessment and Plan  Restrictive airway disease Stable; no reported use of inhalers; continue to monitor  Chronic kidney disease, stage III (moderate) (HCC) Most recent creatinine 0.94, no GFR available; no change from prior; monitor intervals  Bilateral lower extremity edema Chronic and stable; continue Lasix every other day    Madeline Parrish. [name], MD

## 2018-05-05 NOTE — Assessment & Plan Note (Signed)
Most recent creatinine 0.94, no GFR available; no change from prior; monitor intervals

## 2018-05-05 NOTE — Assessment & Plan Note (Signed)
Chronic and stable; continue Lasix every other day

## 2018-05-05 NOTE — Assessment & Plan Note (Signed)
Stable; no reported use of inhalers; continue to monitor

## 2018-06-03 ENCOUNTER — Non-Acute Institutional Stay (SKILLED_NURSING_FACILITY): Payer: Medicare Other | Admitting: Internal Medicine

## 2018-06-03 ENCOUNTER — Encounter: Payer: Self-pay | Admitting: Internal Medicine

## 2018-06-03 DIAGNOSIS — I10 Essential (primary) hypertension: Secondary | ICD-10-CM | POA: Diagnosis not present

## 2018-06-03 DIAGNOSIS — R600 Localized edema: Secondary | ICD-10-CM

## 2018-06-03 DIAGNOSIS — I471 Supraventricular tachycardia, unspecified: Secondary | ICD-10-CM

## 2018-06-03 NOTE — Progress Notes (Signed)
Location:  [facility] Room Number: 205D Place of Service:  SNF [phone])  [name]. [name], MD  Patient Care Team: Via, Caryn Bee, MD as PCP - General [facility] Medicine)  Extended Emergency Contact Information Primary Emergency Contact: McNeely,Richard Address: [address]          [place], [place] [address] [place] Home Phone: [phone] Relation: Brother    Allergies: Patient has no known allergies.  Chief Complaint  Patient presents with  . Medical Management of Chronic Issues    Routine visit    HPI: Patient is 84 y.o. female who is being seen for routine issues of paroxysmal SVT, hypertension, and bilateral lower extremity edema.  Past Medical History:  Diagnosis Date  . Acne rosacea   . CKD (chronic kidney disease), stage III (HCC)   . Depression   . Hypertension   . Osteopenia   . PUD (peptic ulcer disease)   . Restrictive lung disease   . Shingles   . Tinnitus    and vertigo    History reviewed. No pertinent surgical history.  Allergies as of 06/03/2018   No Known Allergies     Medication List       Accurate as of June 03, 2018 11:59 PM. Always use your most recent med list.        CENTRUM SILVER 50+WOMEN Tabs Take 1 tablet by mouth daily.   furosemide 20 MG tablet Commonly known as:  LASIX Take 20 mg by mouth every other day.   metoprolol succinate 25 MG 24 hr tablet Commonly known as:  TOPROL-XL Take 25 mg by mouth daily.   mirtazapine 7.5 MG tablet Commonly known as:  REMERON Take 7.5 mg by mouth. TAKE 1 TABLET BY MOUTH AT BEDTIME FOR APPETITE STIMULANT   polyethylene glycol packet Commonly known as:  MIRALAX / GLYCOLAX Take 17 g by mouth daily.   potassium chloride 10 MEQ tablet Commonly known as:  K-DUR,KLOR-CON Take 10 mEq by mouth every other day.       No orders of the defined types were placed in this encounter.   Immunization History  Administered Date(s) Administered    . Influenza, High Dose Seasonal PF 03/31/2017    Social History   Tobacco Use  . Smoking status: Never Smoker  . Smokeless tobacco: Never Used  Substance Use Topics  . Alcohol use: No    Review of Systems  DATA OBTAINED: from patient, nurse GENERAL:  no fevers, fatigue, appetite changes SKIN: No itching, rash HEENT: No complaint RESPIRATORY: No cough, wheezing, SOB CARDIAC: No chest pain, palpitations, lower extremity edema  GI: No abdominal pain, No N/V/D or constipation, No heartburn or reflux  GU: No dysuria, frequency or urgency, or incontinence  MUSCULOSKELETAL: No unrelieved bone/joint pain NEUROLOGIC: No headache, dizziness  PSYCHIATRIC: No overt anxiety or sadness  Vitals:   06/03/18 1440  BP: 110/60  Pulse: 68  Resp: 18  Temp: (!) 97.1 F (36.2 C)   Body mass index is 19.38 kg/m. Physical Exam  GENERAL APPEARANCE: Alert, conversant, No acute distress  SKIN: No diaphoresis rash HEENT: Unremarkable RESPIRATORY: Breathing is even, unlabored. Lung sounds are clear   CARDIOVASCULAR: Heart RRR no murmurs, rubs or gallops.  Minimal peripheral edema  GASTROINTESTINAL: Abdomen is soft, non-tender, not distended w/ normal bowel sounds.  GENITOURINARY: Bladder non tender, not distended  MUSCULOSKELETAL: No abnormal joints or musculature NEUROLOGIC: Cranial nerves 2-12 grossly intact. Moves all extremities PSYCHIATRIC: Mood  and affect appropriate to situation with some dementia, no behavioral issues  Patient Active Problem List   Diagnosis Date Noted  . Pneumonia of lower lobe of lung (HCC) 02/09/2018  . Delirium 02/09/2018  . Chronic diastolic (congestive) heart failure (HCC) 02/09/2018  . Hematoma of left lower extremity 02/09/2018  . Acute on chronic diastolic CHF (congestive heart failure) (HCC) 01/30/2018  . Sepsis due to pneumonia (HCC) 01/30/2018  . Agitation 01/30/2018  . Right leg injury, subsequent encounter 01/30/2018  . Benign hypertension with  chronic kidney disease, stage III (HCC) 10/06/2017  . Chronic kidney disease, stage III (moderate) (HCC) 10/06/2017  . Restrictive airway disease 10/06/2017  . Fall 09/29/2017  . Acute renal failure superimposed on stage 3 chronic kidney disease (HCC) 09/29/2017  . Bilateral lower extremity edema 08/15/2015  . Paroxysmal supraventricular tachycardia (HCC) 12/06/2013  . Essential hypertension, benign 12/06/2013  . Syncope and collapse 12/06/2013    CMP     Component Value Date/Time   NA 145 02/02/2018 0627   NA 147 10/17/2017   K 3.1 (L) 02/02/2018 0627   CL 109 02/02/2018 0627   CO2 27 02/02/2018 0627   GLUCOSE 95 02/02/2018 0627   BUN 11 02/02/2018 0627   BUN 22 (A) 10/17/2017   CREATININE 0.94 02/02/2018 0627   CREATININE 1.07 (H) 08/15/2015 1422   CALCIUM 8.6 (L) 02/02/2018 0627   PROT 5.1 (L) 01/30/2018 1724   ALBUMIN 3.0 (L) 01/30/2018 1724   AST 43 (H) 01/30/2018 1724   ALT 15 01/30/2018 1724   ALKPHOS 55 01/30/2018 1724   BILITOT 1.8 (H) 01/30/2018 1724   GFRNONAA 50 (L) 02/02/2018 0627   GFRAA 58 (L) 02/02/2018 0627   Recent Labs    01/30/18 1724 02/01/18 0846 02/02/18 0627  NA 142 143 145  K 5.6* 3.2* 3.1*  CL 109 109 109  CO2 18* 25 27  GLUCOSE 115* 99 95  BUN 29* 16 11  CREATININE 1.84* 0.96 0.94  CALCIUM 8.4* 8.6* 8.6*   Recent Labs    09/30/17 0728 01/29/18 0952 01/30/18 1724  AST 108* 17 43*  ALT 36 12 15  ALKPHOS 43 51 55  BILITOT 1.1 0.8 1.8*  PROT 5.0* 5.6* 5.1*  ALBUMIN 2.5* 3.0* 3.0*   Recent Labs    09/29/17 1127  01/29/18 0952 01/30/18 2009 02/01/18 0846 02/02/18 0627  WBC 13.5*   < > 10.3 11.1* 8.3 8.4  NEUTROABS 11.4*  --  8.6* 8.0*  --   --   HGB 13.8   < > 13.5 12.0 12.8 12.3  HCT 42.6   < > 41.3 38.3 39.4 37.4  MCV 95.1   < > 95.6 98.5 95.4 93.5  PLT 252   < > 242 241 230 268   < > = values in this interval not displayed.   No results for input(s): CHOL, LDLCALC, TRIG in the last 8760 hours.  Invalid input(s):  HCL No results found for: [facility] Lab Results  Component Value Date   TSH 4.553 (H) 01/30/2018   No results found for: HGBA1C Lab Results  Component Value Date   CHOL 167 08/15/2015   HDL 65 08/15/2015   LDLCALC 87 08/15/2015   TRIG 76 08/15/2015   CHOLHDL 2.6 08/15/2015    Significant Diagnostic Results in last 30 days:  No results found.  Assessment and Plan  Paroxysmal supraventricular tachycardia (HCC) Controlled; continue Toprol-XL 25 mg daily  Essential hypertension, benign Controlled; continue Toprol-XL 25 mg daily  Bilateral lower  extremity edema Stable, at baseline, minimal edema; continue Lasix 20 mg every other day    Randon Goldsmithnne D. Lyn HollingsheadAlexander, MD

## 2018-06-14 ENCOUNTER — Encounter: Payer: Self-pay | Admitting: Internal Medicine

## 2018-06-14 NOTE — Assessment & Plan Note (Signed)
Controlled; continue Toprol-XL 25 mg daily 

## 2018-06-14 NOTE — Assessment & Plan Note (Signed)
Stable, at baseline, minimal edema; continue Lasix 20 mg every other day

## 2018-07-02 ENCOUNTER — Encounter: Payer: Self-pay | Admitting: Internal Medicine

## 2018-07-02 ENCOUNTER — Non-Acute Institutional Stay (SKILLED_NURSING_FACILITY): Payer: Medicare Other | Admitting: Internal Medicine

## 2018-07-02 DIAGNOSIS — L03116 Cellulitis of left lower limb: Secondary | ICD-10-CM | POA: Diagnosis not present

## 2018-07-02 NOTE — Progress Notes (Signed)
:  [facility] Room Number: 205D Place of Service:  SNF ([phone])  Randon Goldsmithnne D. Lyn HollingsheadAlexander, MD  Patient Care Team: Via, Caryn BeeKevin, MD as PCP - General [facility](Family [facility])  Extended Emergency Contact Information Primary Emergency Contact: McNeely,Richard Address: [address]          [place], [place] [address] [place] Home Phone: [phone] Relation: Brother     Allergies: Patient has no known allergies.  Chief Complaint  Patient presents with  . Acute Visit    HPI: Patient is 85 y.o. female who the wound care nurse asked me to see for possible cellulitis.  Had a dry scaly rash on both legs and on the left leg it has developed into an area that is wet and red, but does not appear to be gangrene.  There is some heat, no swelling.  Past Medical History:  Diagnosis Date  . Acne rosacea   . CKD (chronic kidney disease), stage III (HCC)   . Depression   . Hypertension   . Osteopenia   . PUD (peptic ulcer disease)   . Restrictive lung disease   . Shingles   . Tinnitus    and vertigo    History reviewed. No pertinent surgical history.  Allergies as of 07/02/2018   No Known Allergies     Medication List       Accurate as of July 02, 2018  3:32 PM. Always use your most recent med list.        CENTRUM SILVER 50+WOMEN Tabs Take 1 tablet by mouth daily.   furosemide 20 MG tablet Commonly known as:  LASIX Take 20 mg by mouth every other day.   metoprolol succinate 25 MG 24 hr tablet Commonly known as:  TOPROL-XL Take 25 mg by mouth daily.   mirtazapine 7.5 MG tablet Commonly known as:  REMERON Take 7.5 mg by mouth. TAKE 1 TABLET BY MOUTH AT BEDTIME FOR APPETITE STIMULANT   polyethylene glycol packet Commonly known as:  MIRALAX / GLYCOLAX Take 17 g by mouth daily.   potassium chloride 10 MEQ tablet Commonly known as:  K-DUR,KLOR-CON Take 10 mEq by mouth every other day.       No orders of the defined types  were placed in this encounter.   Immunization History  Administered Date(s) Administered  . Influenza, High Dose Seasonal PF 03/31/2017    Social History   Tobacco Use  . Smoking status: Never Smoker  . Smokeless tobacco: Never Used  Substance Use Topics  . Alcohol use: No    Family history is   Family History  Problem Relation Age of Onset  . CVA Mother       Review of Systems  DATA OBTAINED: from patient, nurse GENERAL:  no fevers, fatigue, appetite changes SKIN: rash, minimal if any pain EYES: No eye pain, redness, discharge EARS: No earache, tinnitus, change in hearing NOSE: No congestion, drainage or bleeding  MOUTH/THROAT: No mouth or tooth pain, No sore throat RESPIRATORY: No cough, wheezing, SOB CARDIAC: No chest pain, palpitations, lower extremity edema  GI: No abdominal pain, No N/V/D or constipation, No heartburn or reflux  GU: No dysuria, frequency or urgency, or incontinence  MUSCULOSKELETAL: No unrelieved bone/joint pain NEUROLOGIC: No headache, dizziness or focal weakness PSYCHIATRIC: No c/o anxiety or sadness   Vitals:   07/02/18 1528  BP: 104/60  Pulse: 74  Resp: 20  Temp: (!) 97 F (36.1 C)  SpO2 Readings from Last 1 Encounters:  02/02/18 97%   Body mass index is 18.57 kg/m.     Physical Exam  GENERAL APPEARANCE: Alert, conversant,  No acute distress.  SKIN: Left anterior leg with redness as if a layer of skin has been sheared off, some moisture and heat no swelling HEAD: Normocephalic, atraumatic  EYES: Conjunctiva/lids clear. Pupils round, reactive. EOMs intact.  EARS: External exam WNL, canals clear. Hearing grossly normal.  NOSE: No deformity or discharge.  MOUTH/THROAT: Lips w/o lesions  RESPIRATORY: Breathing is even, unlabored. Lung sounds are clear   CARDIOVASCULAR: Heart RRR no murmurs, rubs or gallops. No peripheral edema.  Moderate dorsalis pedis pulse on the right foot, minimal dorsalis pedis pulse left  foot GASTROINTESTINAL: Abdomen is soft, non-tender, not distended w/ normal bowel sounds. GENITOURINARY: Bladder non tender, not distended  MUSCULOSKELETAL: No abnormal joints or musculature NEUROLOGIC:  Cranial nerves 2-12 grossly intact. Moves all extremities  PSYCHIATRIC: Mood and affect appropriate to situation, no behavioral issues  Patient Active Problem List   Diagnosis Date Noted  . Pneumonia of lower lobe of lung (HCC) 02/09/2018  . Delirium 02/09/2018  . Chronic diastolic (congestive) heart failure (HCC) 02/09/2018  . Hematoma of left lower extremity 02/09/2018  . Acute on chronic diastolic CHF (congestive heart failure) (HCC) 01/30/2018  . Sepsis due to pneumonia (HCC) 01/30/2018  . Agitation 01/30/2018  . Right leg injury, subsequent encounter 01/30/2018  . Benign hypertension with chronic kidney disease, stage III (HCC) 10/06/2017  . Chronic kidney disease, stage III (moderate) (HCC) 10/06/2017  . Restrictive airway disease 10/06/2017  . Fall 09/29/2017  . Acute renal failure superimposed on stage 3 chronic kidney disease (HCC) 09/29/2017  . Bilateral lower extremity edema 08/15/2015  . Paroxysmal supraventricular tachycardia (HCC) 12/06/2013  . Essential hypertension, benign 12/06/2013  . Syncope and collapse 12/06/2013      Labs reviewed: Basic Metabolic Panel:    Component Value Date/Time   NA 145 02/02/2018 0627   NA 147 10/17/2017   K 3.1 (L) 02/02/2018 0627   CL 109 02/02/2018 0627   CO2 27 02/02/2018 0627   GLUCOSE 95 02/02/2018 0627   BUN 11 02/02/2018 0627   BUN 22 (A) 10/17/2017   CREATININE 0.94 02/02/2018 0627   CREATININE 1.07 (H) 08/15/2015 1422   CALCIUM 8.6 (L) 02/02/2018 0627   PROT 5.1 (L) 01/30/2018 1724   ALBUMIN 3.0 (L) 01/30/2018 1724   AST 43 (H) 01/30/2018 1724   ALT 15 01/30/2018 1724   ALKPHOS 55 01/30/2018 1724   BILITOT 1.8 (H) 01/30/2018 1724   GFRNONAA 50 (L) 02/02/2018 0627   GFRAA 58 (L) 02/02/2018 0627    Recent Labs     01/30/18 1724 02/01/18 0846 02/02/18 0627  NA 142 143 145  K 5.6* 3.2* 3.1*  CL 109 109 109  CO2 18* 25 27  GLUCOSE 115* 99 95  BUN 29* 16 11  CREATININE 1.84* 0.96 0.94  CALCIUM 8.4* 8.6* 8.6*   Liver Function Tests: Recent Labs    09/30/17 0728 01/29/18 0952 01/30/18 1724  AST 108* 17 43*  ALT 36 12 15  ALKPHOS 43 51 55  BILITOT 1.1 0.8 1.8*  PROT 5.0* 5.6* 5.1*  ALBUMIN 2.5* 3.0* 3.0*   No results for input(s): LIPASE, AMYLASE in the last 8760 hours. No results for input(s): AMMONIA in the last 8760 hours. CBC: Recent Labs    09/29/17 1127  01/29/18 0952 01/30/18 2009 02/01/18 0846 02/02/18 0627  WBC 13.5*   < >  10.3 11.1* 8.3 8.4  NEUTROABS 11.4*  --  8.6* 8.0*  --   --   HGB 13.8   < > 13.5 12.0 12.8 12.3  HCT 42.6   < > 41.3 38.3 39.4 37.4  MCV 95.1   < > 95.6 98.5 95.4 93.5  PLT 252   < > 242 241 230 268   < > = values in this interval not displayed.   Lipid No results for input(s): CHOL, HDL, LDLCALC, TRIG in the last 8760 hours.  Cardiac Enzymes: Recent Labs    09/30/17 0229 09/30/17 0728 10/02/17 0627 10/03/17 0739 01/29/18 0952 01/30/18 1724  CKTOTAL  --  3,002* 1,109* 544*  --  176  TROPONINI 0.88* 0.73*  --   --  <0.03  --    BNP: No results for input(s): BNP in the last 8760 hours. No results found for: MICROALBUR No results found for: HGBA1C Lab Results  Component Value Date   TSH 4.553 (H) 01/30/2018   No results found for: VITAMINB12 No results found for: FOLATE No results found for: IRON, TIBC, FERRITIN  Imaging and Procedures obtained prior to SNF admission: Dg Chest Port 1 View  Result Date: 01/30/2018 CLINICAL DATA:  85 year old found lethargic and unresponsive at the nursing home. Hypotensive upon EMS arrival (60/30). EXAM: PORTABLE CHEST 1 VIEW COMPARISON:  01/29/2018, 09/29/2017. FINDINGS: Cardiac silhouette moderately to markedly enlarged. Thoracic aorta tortuous and atherosclerotic. Interval development of mild  diffuse interstitial pulmonary edema. More focal patchy airspace opacities at the LEFT lung base. No focal airspace opacities elsewhere. IMPRESSION: 1. Stable moderate to marked cardiomegaly. Mild diffuse interstitial pulmonary edema indicates mild CHF and/or fluid overload. 2. Focal airspace opacities at the LEFT lung base, query superimposed pneumonia. Electronically Signed   By: Hulan Saashomas  Lawrence M.D.   On: 01/30/2018 18:04     Not all labs, radiology exams or other studies done during hospitalization come through on my EPIC note; however they are reviewed by me.    Assessment and Plan  Cellulitis left leg- doxycycline 100 mg twice daily for 10 days; continue wound care; monitor    Jalysa Swopes D. Lyn HollingsheadAlexander, MD

## 2018-07-03 ENCOUNTER — Encounter: Payer: Self-pay | Admitting: Internal Medicine

## 2018-07-06 ENCOUNTER — Non-Acute Institutional Stay (SKILLED_NURSING_FACILITY): Payer: Medicare Other | Admitting: Internal Medicine

## 2018-07-06 ENCOUNTER — Encounter: Payer: Self-pay | Admitting: Internal Medicine

## 2018-07-06 DIAGNOSIS — L03116 Cellulitis of left lower limb: Secondary | ICD-10-CM

## 2018-07-06 DIAGNOSIS — I5032 Chronic diastolic (congestive) heart failure: Secondary | ICD-10-CM | POA: Diagnosis not present

## 2018-07-06 DIAGNOSIS — N183 Chronic kidney disease, stage 3 (moderate): Secondary | ICD-10-CM | POA: Diagnosis not present

## 2018-07-06 DIAGNOSIS — I129 Hypertensive chronic kidney disease with stage 1 through stage 4 chronic kidney disease, or unspecified chronic kidney disease: Secondary | ICD-10-CM

## 2018-07-06 NOTE — Progress Notes (Signed)
Location:  [facility] Room Number: 205D Place of Service:  SNF [phone])  [name]. [name], MD  Patient Care Team: Via, Caryn Bee, MD as PCP - General [facility] Medicine)  Extended Emergency Contact Information Primary Emergency Contact: McNeely,Richard Address: [address]          [place], [place] [address] [place] Home Phone: [phone] Relation: Brother    Allergies: Patient has no known allergies.  Chief Complaint  Patient presents with  . Medical Management of Chronic Issues    Routine Visit    HPI: Patient is 85 y.o. female who is being seen for routine issues of hypertension and diastolic congestive heart failure and for checkup on cellulitis of left lower extremity.  Past Medical History:  Diagnosis Date  . Acne rosacea   . CKD (chronic kidney disease), stage III (HCC)   . Depression   . Hypertension   . Osteopenia   . PUD (peptic ulcer disease)   . Restrictive lung disease   . Shingles   . Tinnitus    and vertigo    History reviewed. No pertinent surgical history.  Allergies as of 07/06/2018   No Known Allergies     Medication List       Accurate as of July 06, 2018 11:59 PM. Always use your most recent med list.        CENTRUM SILVER 50+WOMEN Tabs Take 1 tablet by mouth daily.   furosemide 20 MG tablet Commonly known as:  LASIX Take 20 mg by mouth every other day.   metoprolol succinate 25 MG 24 hr tablet Commonly known as:  TOPROL-XL Take 25 mg by mouth daily.   mirtazapine 7.5 MG tablet Commonly known as:  REMERON Take 7.5 mg by mouth. TAKE 1 TABLET BY MOUTH AT BEDTIME FOR APPETITE STIMULANT   polyethylene glycol packet Commonly known as:  MIRALAX / GLYCOLAX Take 17 g by mouth daily.   potassium chloride 10 MEQ tablet Commonly known as:  K-DUR,KLOR-CON Take 10 mEq by mouth every other day.       No orders of the defined types were placed in this encounter.   Immunization  History  Administered Date(s) Administered  . Influenza, High Dose Seasonal PF 03/31/2017    Social History   Tobacco Use  . Smoking status: Never Smoker  . Smokeless tobacco: Never Used  Substance Use Topics  . Alcohol use: No    Review of Systems  DATA OBTAINED: from patient, nurse GENERAL:  no fevers, fatigue, appetite changes SKIN: Left lower extremity reported to be improved HEENT: No complaint RESPIRATORY: No cough, wheezing, SOB CARDIAC: No chest pain, palpitations, lower extremity edema  GI: No abdominal pain, No N/V/D or constipation, No heartburn or reflux  GU: No dysuria, frequency or urgency, or incontinence  MUSCULOSKELETAL: No unrelieved bone/joint pain NEUROLOGIC: No headache, dizziness  PSYCHIATRIC: No overt anxiety or sadness  Vitals:   07/06/18 1200  BP: 104/60  Pulse: 74  Resp: 20  Temp: (!) 97 F (36.1 C)   Body mass index is 18.54 kg/m. Physical Exam  GENERAL APPEARANCE: Alert, conversant, No acute distress  SKIN: Left lower extremity not visualized this visit secondary to dressing in place HEENT: Unremarkable RESPIRATORY: Breathing is even, unlabored. Lung sounds are clear   CARDIOVASCULAR: Heart RRR no murmurs, rubs or gallops. minimal peripheral edema  GASTROINTESTINAL: Abdomen is soft, non-tender, not distended w/ normal bowel sounds.  GENITOURINARY: Bladder non tender, not distended  MUSCULOSKELETAL: No abnormal joints or musculature NEUROLOGIC: Cranial nerves 2-12 grossly intact. Moves all extremities PSYCHIATRIC: Mood and affect appropriate to situation with dementia, no behavioral issues  Patient Active Problem List   Diagnosis Date Noted  . Cellulitis of left lower extremity 07/07/2018  . Pneumonia of lower lobe of lung (HCC) 02/09/2018  . Delirium 02/09/2018  . Chronic diastolic (congestive) heart failure (HCC) 02/09/2018  . Hematoma of left lower extremity 02/09/2018  . Acute on chronic diastolic CHF (congestive heart failure)  (HCC) 01/30/2018  . Sepsis due to pneumonia (HCC) 01/30/2018  . Agitation 01/30/2018  . Right leg injury, subsequent encounter 01/30/2018  . Benign hypertension with chronic kidney disease, stage III (HCC) 10/06/2017  . Chronic kidney disease, stage III (moderate) (HCC) 10/06/2017  . Restrictive airway disease 10/06/2017  . Fall 09/29/2017  . Acute renal failure superimposed on stage 3 chronic kidney disease (HCC) 09/29/2017  . Bilateral lower extremity edema 08/15/2015  . Paroxysmal supraventricular tachycardia (HCC) 12/06/2013  . Essential hypertension, benign 12/06/2013  . Syncope and collapse 12/06/2013    CMP     Component Value Date/Time   NA 145 02/02/2018 0627   NA 147 10/17/2017   K 3.1 (L) 02/02/2018 0627   CL 109 02/02/2018 0627   CO2 27 02/02/2018 0627   GLUCOSE 95 02/02/2018 0627   BUN 11 02/02/2018 0627   BUN 22 (A) 10/17/2017   CREATININE 0.94 02/02/2018 0627   CREATININE 1.07 (H) 08/15/2015 1422   CALCIUM 8.6 (L) 02/02/2018 0627   PROT 5.1 (L) 01/30/2018 1724   ALBUMIN 3.0 (L) 01/30/2018 1724   AST 43 (H) 01/30/2018 1724   ALT 15 01/30/2018 1724   ALKPHOS 55 01/30/2018 1724   BILITOT 1.8 (H) 01/30/2018 1724   GFRNONAA 50 (L) 02/02/2018 0627   GFRAA 58 (L) 02/02/2018 0627   Recent Labs    01/30/18 1724 02/01/18 0846 02/02/18 0627  NA 142 143 145  K 5.6* 3.2* 3.1*  CL 109 109 109  CO2 18* 25 27  GLUCOSE 115* 99 95  BUN 29* 16 11  CREATININE 1.84* 0.96 0.94  CALCIUM 8.4* 8.6* 8.6*   Recent Labs    09/30/17 0728 01/29/18 0952 01/30/18 1724  AST 108* 17 43*  ALT 36 12 15  ALKPHOS 43 51 55  BILITOT 1.1 0.8 1.8*  PROT 5.0* 5.6* 5.1*  ALBUMIN 2.5* 3.0* 3.0*   Recent Labs    09/29/17 1127  01/29/18 0952 01/30/18 2009 02/01/18 0846 02/02/18 0627  WBC 13.5*   < > 10.3 11.1* 8.3 8.4  NEUTROABS 11.4*  --  8.6* 8.0*  --   --   HGB 13.8   < > 13.5 12.0 12.8 12.3  HCT 42.6   < > 41.3 38.3 39.4 37.4  MCV 95.1   < > 95.6 98.5 95.4 93.5  PLT  252   < > 242 241 230 268   < > = values in this interval not displayed.   No results for input(s): CHOL, LDLCALC, TRIG in the last 8760 hours.  Invalid input(s): HCL No results found for: [facility] Lab Results  Component Value Date   TSH 4.553 (H) 01/30/2018   No results found for: HGBA1C Lab Results  Component Value Date   CHOL 167 08/15/2015   HDL 65 08/15/2015   LDLCALC 87 08/15/2015   TRIG 76 08/15/2015   CHOLHDL 2.6 08/15/2015    Significant Diagnostic Results in last 30 days:  No results found.  Assessment and Plan  Benign hypertension with chronic kidney disease, stage III (HCC) Well-controlled; continue Cozaar 25 mg daily, Toprol-XL 25 mg daily and Lasix every other day  Chronic diastolic (congestive) heart failure (HCC) Continues without exacerbation; continue Lasix 20 mg every other day, Toprol-XL 25 mg daily and Cozaar 25 mg daily  Cellulitis of left lower extremity Improving with doxycycline; there is no longer any odor and drainage is less; still a very active wound however; continue to monitor    Amory Simonetti D. [name], MD

## 2018-07-07 ENCOUNTER — Encounter: Payer: Self-pay | Admitting: Internal Medicine

## 2018-07-07 DIAGNOSIS — L03116 Cellulitis of left lower limb: Secondary | ICD-10-CM | POA: Insufficient documentation

## 2018-07-07 NOTE — Assessment & Plan Note (Signed)
Continues without exacerbation; continue Lasix 20 mg every other day, Toprol-XL 25 mg daily and Cozaar 25 mg daily

## 2018-07-07 NOTE — Assessment & Plan Note (Signed)
Well-controlled; continue Cozaar 25 mg daily, Toprol-XL 25 mg daily and Lasix every other day

## 2018-07-07 NOTE — Assessment & Plan Note (Signed)
Improving with doxycycline; there is no longer any odor and drainage is less; still a very active wound however; continue to monitor

## 2018-07-29 DIAGNOSIS — N183 Chronic kidney disease, stage 3 (moderate): Secondary | ICD-10-CM | POA: Diagnosis not present

## 2018-07-29 DIAGNOSIS — Z9181 History of falling: Secondary | ICD-10-CM | POA: Diagnosis not present

## 2018-07-30 DIAGNOSIS — Z9181 History of falling: Secondary | ICD-10-CM | POA: Diagnosis not present

## 2018-07-30 DIAGNOSIS — N183 Chronic kidney disease, stage 3 (moderate): Secondary | ICD-10-CM | POA: Diagnosis not present

## 2018-07-31 DIAGNOSIS — N183 Chronic kidney disease, stage 3 (moderate): Secondary | ICD-10-CM | POA: Diagnosis not present

## 2018-07-31 DIAGNOSIS — Z9181 History of falling: Secondary | ICD-10-CM | POA: Diagnosis not present

## 2018-08-03 DIAGNOSIS — N183 Chronic kidney disease, stage 3 (moderate): Secondary | ICD-10-CM | POA: Diagnosis not present

## 2018-08-03 DIAGNOSIS — Z9181 History of falling: Secondary | ICD-10-CM | POA: Diagnosis not present

## 2018-08-04 ENCOUNTER — Encounter: Payer: Self-pay | Admitting: Internal Medicine

## 2018-08-04 ENCOUNTER — Non-Acute Institutional Stay (SKILLED_NURSING_FACILITY): Payer: Medicare Other | Admitting: Internal Medicine

## 2018-08-04 DIAGNOSIS — I471 Supraventricular tachycardia: Secondary | ICD-10-CM

## 2018-08-04 DIAGNOSIS — N183 Chronic kidney disease, stage 3 unspecified: Secondary | ICD-10-CM

## 2018-08-04 DIAGNOSIS — J984 Other disorders of lung: Secondary | ICD-10-CM | POA: Diagnosis not present

## 2018-08-04 DIAGNOSIS — Z9181 History of falling: Secondary | ICD-10-CM | POA: Diagnosis not present

## 2018-08-04 NOTE — Progress Notes (Signed)
[facility] Room Number: 205D Place of Service:  SNF [phone]  Madeline Goldsmithnne D. Lyn HollingsheadAlexander, MD  Patient Care Team: Via, Caryn BeeKevin, MD as PCP - General [facility](Family Medicine)  Extended Emergency Contact Information Primary Emergency Contact: McNeely,Richard Address: [address]          [place], [place] [address] [place] Home Phone: [phone] Relation: Brother    Allergies: Patient has no known allergies.  Chief Complaint  Patient presents with  . Medical Management of Chronic Issues    Routine visit    HPI: Patient is 85 y.o. female who is being seen for routine issues of chronic kidney disease 3, paroxysmal SVT, and restrictive lung disease.  Past Medical History:  Diagnosis Date  . Acne rosacea   . CKD (chronic kidney disease), stage III (HCC)   . Depression   . Hypertension   . Osteopenia   . PUD (peptic ulcer disease)   . Restrictive lung disease   . Shingles   . Tinnitus    and vertigo    History reviewed. No pertinent surgical history.  Allergies as of 08/04/2018   No Known Allergies     Medication List       Accurate as of August 04, 2018 11:59 PM. Always use your most recent med list.        CENTRUM SILVER 50+WOMEN Tabs Take 1 tablet by mouth daily.   furosemide 20 MG tablet Commonly known as:  LASIX Take 20 mg by mouth every other day.   metoprolol succinate 25 MG 24 hr tablet Commonly known as:  TOPROL-XL Take 25 mg by mouth daily.   mirtazapine 7.5 MG tablet Commonly known as:  REMERON Take 7.5 mg by mouth. TAKE 1 TABLET BY MOUTH AT BEDTIME FOR APPETITE STIMULANT   polyethylene glycol packet Commonly known as:  MIRALAX / GLYCOLAX Take 17 g by mouth daily.   potassium chloride 10 MEQ tablet Commonly known as:  K-DUR,KLOR-CON Take 10 mEq by mouth every other day.       No orders of the defined types were placed in this encounter.   Immunization History  Administered Date(s)  Administered  . Influenza, High Dose Seasonal PF 03/31/2017    Social History   Tobacco Use  . Smoking status: Never Smoker  . Smokeless tobacco: Never Used  Substance Use Topics  . Alcohol use: No    Review of Systems  DATA OBTAINED: from patient, nurse GENERAL:  no fevers, fatigue, appetite changes SKIN: No itching, rash HEENT: No complaint RESPIRATORY: No cough, wheezing, SOB CARDIAC: No chest pain, palpitations, lower extremity edema  GI: No abdominal pain, No N/V/D or constipation, No heartburn or reflux  GU: No dysuria, frequency or urgency, or incontinence  MUSCULOSKELETAL: No unrelieved bone/joint pain NEUROLOGIC: No headache, dizziness  PSYCHIATRIC: No overt anxiety or sadness  Vitals:   08/04/18 1436  BP: 140/68  Pulse: 77  Resp: 16  Temp: 98 F (36.7 C)   Body mass index is 19.12 kg/m. Physical Exam  GENERAL APPEARANCE: Alert, conversant, No acute distress, very active young lady who wheels her self around the facility daily SKIN: No diaphoresis rash HEENT: Unremarkable RESPIRATORY: Breathing is even, unlabored. Lung sounds are clear   CARDIOVASCULAR: Heart RRR no murmurs, rubs or gallops. No peripheral edema  GASTROINTESTINAL: Abdomen is soft, non-tender, not distended w/ normal bowel sounds.  GENITOURINARY: Bladder non tender, not distended  MUSCULOSKELETAL: No abnormal joints or musculature NEUROLOGIC:  Cranial nerves 2-12 grossly intact. Moves all extremities PSYCHIATRIC: Mood and affect appropriate to situation, no behavioral issues  Patient Active Problem List   Diagnosis Date Noted  . Cellulitis of left lower extremity 07/07/2018  . Pneumonia of lower lobe of lung (HCC) 02/09/2018  . Delirium 02/09/2018  . Chronic diastolic (congestive) heart failure (HCC) 02/09/2018  . Hematoma of left lower extremity 02/09/2018  . Acute on chronic diastolic CHF (congestive heart failure) (HCC) 01/30/2018  . Sepsis due to pneumonia (HCC) 01/30/2018  .  Agitation 01/30/2018  . Right leg injury, subsequent encounter 01/30/2018  . Benign hypertension with chronic kidney disease, stage III (HCC) 10/06/2017  . Chronic kidney disease, stage III (moderate) (HCC) 10/06/2017  . Restrictive airway disease 10/06/2017  . Fall 09/29/2017  . Acute renal failure superimposed on stage 3 chronic kidney disease (HCC) 09/29/2017  . Bilateral lower extremity edema 08/15/2015  . Paroxysmal supraventricular tachycardia (HCC) 12/06/2013  . Essential hypertension, benign 12/06/2013  . Syncope and collapse 12/06/2013    CMP     Component Value Date/Time   NA 145 02/02/2018 0627   NA 147 10/17/2017   K 3.1 (L) 02/02/2018 0627   CL 109 02/02/2018 0627   CO2 27 02/02/2018 0627   GLUCOSE 95 02/02/2018 0627   BUN 11 02/02/2018 0627   BUN 22 (A) 10/17/2017   CREATININE 0.94 02/02/2018 0627   CREATININE 1.07 (H) 08/15/2015 1422   CALCIUM 8.6 (L) 02/02/2018 0627   PROT 5.1 (L) 01/30/2018 1724   ALBUMIN 3.0 (L) 01/30/2018 1724   AST 43 (H) 01/30/2018 1724   ALT 15 01/30/2018 1724   ALKPHOS 55 01/30/2018 1724   BILITOT 1.8 (H) 01/30/2018 1724   GFRNONAA 50 (L) 02/02/2018 0627   GFRAA 58 (L) 02/02/2018 0627   Recent Labs    01/30/18 1724 02/01/18 0846 02/02/18 0627  NA 142 143 145  K 5.6* 3.2* 3.1*  CL 109 109 109  CO2 18* 25 27  GLUCOSE 115* 99 95  BUN 29* 16 11  CREATININE 1.84* 0.96 0.94  CALCIUM 8.4* 8.6* 8.6*   Recent Labs    09/30/17 0728 01/29/18 0952 01/30/18 1724  AST 108* 17 43*  ALT 36 12 15  ALKPHOS 43 51 55  BILITOT 1.1 0.8 1.8*  PROT 5.0* 5.6* 5.1*  ALBUMIN 2.5* 3.0* 3.0*   Recent Labs    09/29/17 1127  01/29/18 0952 01/30/18 2009 02/01/18 0846 02/02/18 0627  WBC 13.5*   < > 10.3 11.1* 8.3 8.4  NEUTROABS 11.4*  --  8.6* 8.0*  --   --   HGB 13.8   < > 13.5 12.0 12.8 12.3  HCT 42.6   < > 41.3 38.3 39.4 37.4  MCV 95.1   < > 95.6 98.5 95.4 93.5  PLT 252   < > 242 241 230 268   < > = values in this interval not  displayed.   No results for input(s): CHOL, LDLCALC, TRIG in the last 8760 hours.  Invalid input(s): HCL No results found for: [facility] Lab Results  Component Value Date   TSH 4.553 (H) 01/30/2018   No results found for: HGBA1C Lab Results  Component Value Date   CHOL 167 08/15/2015   HDL 65 08/15/2015   LDLCALC 87 08/15/2015   TRIG 76 08/15/2015   CHOLHDL 2.6 08/15/2015    Significant Diagnostic Results in last 30 days:  No results found.  Assessment and Plan  Chronic kidney disease, stage III (moderate) (HCC) Most  recent GFR  50, which is excellent for a patient of this age; will monitor at intervals  Paroxysmal supraventricular tachycardia (HCC) Controlled; continue Toprol-XL 25 mg daily  Restrictive airway disease Stable;, no reported use of inhalers; continue supportive care    Joshual Terrio D. [name], MD

## 2018-08-05 DIAGNOSIS — N183 Chronic kidney disease, stage 3 (moderate): Secondary | ICD-10-CM | POA: Diagnosis not present

## 2018-08-05 DIAGNOSIS — Z9181 History of falling: Secondary | ICD-10-CM | POA: Diagnosis not present

## 2018-08-08 ENCOUNTER — Encounter: Payer: Self-pay | Admitting: Internal Medicine

## 2018-08-08 NOTE — Assessment & Plan Note (Signed)
Controlled; continue Toprol-XL 25 mg daily 

## 2018-08-08 NOTE — Assessment & Plan Note (Signed)
Stable;, no reported use of inhalers; continue supportive care

## 2018-08-08 NOTE — Assessment & Plan Note (Signed)
Most recent GFR  50, which is excellent for a patient of this age; will monitor at intervals

## 2018-08-12 ENCOUNTER — Non-Acute Institutional Stay (SKILLED_NURSING_FACILITY): Payer: Medicare Other | Admitting: Internal Medicine

## 2018-08-12 ENCOUNTER — Encounter: Payer: Self-pay | Admitting: Internal Medicine

## 2018-08-12 DIAGNOSIS — R21 Rash and other nonspecific skin eruption: Secondary | ICD-10-CM | POA: Diagnosis not present

## 2018-08-12 NOTE — Progress Notes (Signed)
:    [facility] Room Number: 205D Place of Service:  SNF (31)  [name]. [name], MD  Patient Care Team: Via, Caryn Bee, MD as PCP - General [facility] Medicine)  Extended Emergency Contact Information Primary Emergency Contact: McNeely,Richard Address: [address]          [place], [place] [address] [place] Home Phone: [phone] Relation: Brother     Allergies: Patient has no known allergies.  Chief Complaint  Patient presents with  . Acute Visit    HPI: Patient is 85 y.o. female who the wound care nurse asked me to see him.  Patient has a red rash on bilateral lower extremities which she has had for some time.  It is been treated with courses of doxycycline twice with some mild improvement then recurs.  Patient denies pain.  There is no fever chills, nausea vomiting or any other systemic symptom.  Has been changing dressings daily.  Past Medical History:  Diagnosis Date  . Acne rosacea   . CKD (chronic kidney disease), stage III (HCC)   . Depression   . Hypertension   . Osteopenia   . PUD (peptic ulcer disease)   . Restrictive lung disease   . Shingles   . Tinnitus    and vertigo    History reviewed. No pertinent surgical history.  Allergies as of 08/12/2018   No Known Allergies     Medication List       Accurate as of August 12, 2018 12:35 PM. Always use your most recent med list.        CENTRUM SILVER 50+WOMEN Tabs Take 1 tablet by mouth daily.   furosemide 20 MG tablet Commonly known as:  LASIX Take 20 mg by mouth every other day.   metoprolol succinate 25 MG 24 hr tablet Commonly known as:  TOPROL-XL Take 25 mg by mouth daily.   mirtazapine 7.5 MG tablet Commonly known as:  REMERON Take 7.5 mg by mouth. TAKE 1 TABLET BY MOUTH AT BEDTIME FOR APPETITE STIMULANT   polyethylene glycol packet Commonly known as:  MIRALAX / GLYCOLAX Take 17 g by mouth daily.   potassium chloride 10 MEQ  tablet Commonly known as:  K-DUR,KLOR-CON Take 10 mEq by mouth every other day.       No orders of the defined types were placed in this encounter.   Immunization History  Administered Date(s) Administered  . Influenza, High Dose Seasonal PF 03/31/2017    Social History   Tobacco Use  . Smoking status: Never Smoker  . Smokeless tobacco: Never Used  Substance Use Topics  . Alcohol use: No    Family history is   Family History  Problem Relation Age of Onset  . CVA Mother       Review of Systems  DATA OBTAINED: from patient, wound care nurse GENERAL:  no fevers, fatigue, appetite changes SKIN: Rash as per history present illness EYES: No eye pain, redness, discharge EARS: No earache, tinnitus, change in hearing NOSE: No congestion, drainage or bleeding  MOUTH/THROAT: No mouth or tooth pain, No sore throat RESPIRATORY: No cough, wheezing, SOB CARDIAC: No chest pain, palpitations, lower extremity edema  GI: No abdominal pain, No N/V/D or constipation, No heartburn or reflux  GU: No dysuria, frequency or urgency, or incontinence  MUSCULOSKELETAL: No unrelieved bone/joint pain NEUROLOGIC: No headache, dizziness or focal weakness PSYCHIATRIC: No c/o anxiety or sadness   Vitals:   08/12/18  1232  BP: 128/66  Pulse: 75  Resp: 16  Temp: 97.9 F (36.6 C)    SpO2 Readings from Last 1 Encounters:  02/02/18 97%   Body mass index is 19.5 kg/m.     Physical Exam  GENERAL APPEARANCE: Alert, conversant,  No acute distress.  SKIN: Red rubber rash on anterior shins bilaterally with edges with some scaliness HEAD: Normocephalic, atraumatic  EYES: Conjunctiva/lids clear. Pupils round, reactive. EOMs intact.  EARS: External exam WNL, canals clear. Hearing grossly normal.  NOSE: No deformity or discharge.  MOUTH/THROAT: Lips w/o lesions  RESPIRATORY: Breathing is even, unlabored. Lung sounds are clear   CARDIOVASCULAR: Heart RRR no murmurs, rubs or gallops. No  peripheral edema.   GASTROINTESTINAL: Abdomen is soft, non-tender, not distended w/ normal bowel sounds. GENITOURINARY: Bladder non tender, not distended  MUSCULOSKELETAL: No abnormal joints or musculature NEUROLOGIC:  Cranial nerves 2-12 grossly intact. Moves all extremities  PSYCHIATRIC: Mood and affect appropriate to situation with dementia, no behavioral issues  Patient Active Problem List   Diagnosis Date Noted  . Cellulitis of left lower extremity 07/07/2018  . Pneumonia of lower lobe of lung (HCC) 02/09/2018  . Delirium 02/09/2018  . Chronic diastolic (congestive) heart failure (HCC) 02/09/2018  . Hematoma of left lower extremity 02/09/2018  . Acute on chronic diastolic CHF (congestive heart failure) (HCC) 01/30/2018  . Sepsis due to pneumonia (HCC) 01/30/2018  . Agitation 01/30/2018  . Right leg injury, subsequent encounter 01/30/2018  . Benign hypertension with chronic kidney disease, stage III (HCC) 10/06/2017  . Chronic kidney disease, stage III (moderate) (HCC) 10/06/2017  . Restrictive airway disease 10/06/2017  . Fall 09/29/2017  . Acute renal failure superimposed on stage 3 chronic kidney disease (HCC) 09/29/2017  . Bilateral lower extremity edema 08/15/2015  . Paroxysmal supraventricular tachycardia (HCC) 12/06/2013  . Essential hypertension, benign 12/06/2013  . Syncope and collapse 12/06/2013      Labs reviewed: Basic Metabolic Panel:    Component Value Date/Time   NA 145 02/02/2018 0627   NA 147 10/17/2017   K 3.1 (L) 02/02/2018 0627   CL 109 02/02/2018 0627   CO2 27 02/02/2018 0627   GLUCOSE 95 02/02/2018 0627   BUN 11 02/02/2018 0627   BUN 22 (A) 10/17/2017   CREATININE 0.94 02/02/2018 0627   CREATININE 1.07 (H) 08/15/2015 1422   CALCIUM 8.6 (L) 02/02/2018 0627   PROT 5.1 (L) 01/30/2018 1724   ALBUMIN 3.0 (L) 01/30/2018 1724   AST 43 (H) 01/30/2018 1724   ALT 15 01/30/2018 1724   ALKPHOS 55 01/30/2018 1724   BILITOT 1.8 (H) 01/30/2018 1724    GFRNONAA 50 (L) 02/02/2018 0627   GFRAA 58 (L) 02/02/2018 0627    Recent Labs    01/30/18 1724 02/01/18 0846 02/02/18 0627  NA 142 143 145  K 5.6* 3.2* 3.1*  CL 109 109 109  CO2 18* 25 27  GLUCOSE 115* 99 95  BUN 29* 16 11  CREATININE 1.84* 0.96 0.94  CALCIUM 8.4* 8.6* 8.6*   Liver Function Tests: Recent Labs    09/30/17 0728 01/29/18 0952 01/30/18 1724  AST 108* 17 43*  ALT 36 12 15  ALKPHOS 43 51 55  BILITOT 1.1 0.8 1.8*  PROT 5.0* 5.6* 5.1*  ALBUMIN 2.5* 3.0* 3.0*   No results for input(s): LIPASE, AMYLASE in the last 8760 hours. No results for input(s): AMMONIA in the last 8760 hours. CBC: Recent Labs    09/29/17 1127  01/29/18 0952 01/30/18 2009 02/01/18  [id number] 02/02/18 0627  WBC 13.5*   < > 10.3 11.1* 8.3 8.4  NEUTROABS 11.4*  --  8.6* 8.0*  --   --   HGB 13.8   < > 13.5 12.0 12.8 12.3  HCT 42.6   < > 41.3 38.3 39.4 37.4  MCV 95.1   < > 95.6 98.5 95.4 93.5  PLT 252   < > 242 241 230 268   < > = values in this interval not displayed.   Lipid No results for input(s): CHOL, HDL, LDLCALC, TRIG in the last 8760 hours.  Cardiac Enzymes: Recent Labs    09/30/17 0229 09/30/17 0728 10/02/17 0627 10/03/17 0739 01/29/18 0952 01/30/18 1724  CKTOTAL  --  3,002* 1,109* 544*  --  176  TROPONINI 0.88* 0.73*  --   --  <0.03  --    BNP: No results for input(s): BNP in the last 8760 hours. No results found for: MICROALBUR No results found for: HGBA1C Lab Results  Component Value Date   TSH 4.553 (H) 01/30/2018   No results found for: VITAMINB12 No results found for: FOLATE No results found for: IRON, TIBC, FERRITIN  Imaging and Procedures obtained prior to SNF admission: Dg Chest Port 1 View  Result Date: 01/30/2018 CLINICAL DATA:  85 year old found lethargic and unresponsive at the nursing home. Hypotensive upon EMS arrival (60/30). EXAM: PORTABLE CHEST 1 VIEW COMPARISON:  01/29/2018, 09/29/2017. FINDINGS: Cardiac silhouette moderately to markedly  enlarged. Thoracic aorta tortuous and atherosclerotic. Interval development of mild diffuse interstitial pulmonary edema. More focal patchy airspace opacities at the LEFT lung base. No focal airspace opacities elsewhere. IMPRESSION: 1. Stable moderate to marked cardiomegaly. Mild diffuse interstitial pulmonary edema indicates mild CHF and/or fluid overload. 2. Focal airspace opacities at the LEFT lung base, query superimposed pneumonia. Electronically Signed   By: Hulan Saashomas  Lawrence M.D.   On: 01/30/2018 18:04     Not all labs, radiology exams or other studies done during hospitalization come through on my EPIC note; however they are reviewed by me.    Assessment and Plan  Rash bilateral lower extremity- not responding to antibiotics or to good wound care; I would like to have a diagnosis before I would commit to a long-term antibiotic or steroid regimen; will send to wound care clinic to Dr. Leanord Hawkingobson who will give us a biopsy if needed    Randon Goldsmithnne D. Lyn HollingsheadAlexander, MD

## 2018-08-14 ENCOUNTER — Encounter: Payer: Self-pay | Admitting: Internal Medicine

## 2018-08-24 ENCOUNTER — Encounter (HOSPITAL_BASED_OUTPATIENT_CLINIC_OR_DEPARTMENT_OTHER): Payer: Medicare Other | Attending: Internal Medicine

## 2018-08-24 DIAGNOSIS — I130 Hypertensive heart and chronic kidney disease with heart failure and stage 1 through stage 4 chronic kidney disease, or unspecified chronic kidney disease: Secondary | ICD-10-CM | POA: Diagnosis not present

## 2018-08-24 DIAGNOSIS — L308 Other specified dermatitis: Secondary | ICD-10-CM | POA: Diagnosis not present

## 2018-08-24 DIAGNOSIS — N183 Chronic kidney disease, stage 3 (moderate): Secondary | ICD-10-CM | POA: Diagnosis not present

## 2018-08-24 DIAGNOSIS — F0390 Unspecified dementia without behavioral disturbance: Secondary | ICD-10-CM | POA: Diagnosis not present

## 2018-08-24 DIAGNOSIS — I509 Heart failure, unspecified: Secondary | ICD-10-CM | POA: Insufficient documentation

## 2018-08-24 DIAGNOSIS — B354 Tinea corporis: Secondary | ICD-10-CM | POA: Insufficient documentation

## 2018-08-24 DIAGNOSIS — S81802A Unspecified open wound, left lower leg, initial encounter: Secondary | ICD-10-CM | POA: Diagnosis not present

## 2018-09-04 ENCOUNTER — Encounter: Payer: Self-pay | Admitting: Internal Medicine

## 2018-09-04 ENCOUNTER — Non-Acute Institutional Stay (SKILLED_NURSING_FACILITY): Payer: Medicare Other | Admitting: Internal Medicine

## 2018-09-04 DIAGNOSIS — I10 Essential (primary) hypertension: Secondary | ICD-10-CM | POA: Diagnosis not present

## 2018-09-04 DIAGNOSIS — R600 Localized edema: Secondary | ICD-10-CM

## 2018-09-04 DIAGNOSIS — J984 Other disorders of lung: Secondary | ICD-10-CM

## 2018-09-04 NOTE — Progress Notes (Signed)
Location[facility] Room Number: 205D Place of Service:  SNF [phone])  [name]. [name], MD  Patient Care Team: Via, Caryn Bee, MD as PCP - General [facility] Medicine)  Extended Emergency Contact Information Primary Emergency Contact: McNeely,Richard Address: [address]          [place], [place] [address] [place] Home Phone: [phone] Relation: Brother    Allergies: Patient has no known allergies.  Chief Complaint  Patient presents with  . Medical Management of Chronic Issues    Routine visit    HPI: Patient is 85 y.o. female who is being seen for routine issues of hypertension, restrictive airway disease, and bilateral lower extremity edema.  Past Medical History:  Diagnosis Date  . Acne rosacea   . CKD (chronic kidney disease), stage III (HCC)   . Depression   . Hypertension   . Osteopenia   . PUD (peptic ulcer disease)   . Restrictive lung disease   . Shingles   . Tinnitus    and vertigo    History reviewed. No pertinent surgical history.  Allergies as of 09/04/2018   No Known Allergies     Medication List       Accurate as of September 04, 2018 11:59 PM. Always use your most recent med list.        Centrum Silver 50+Women Tabs Take 1 tablet by mouth daily.   furosemide 20 MG tablet Commonly known as:  LASIX Take 20 mg by mouth every other day.   metoprolol succinate 25 MG 24 hr tablet Commonly known as:  TOPROL-XL Take 25 mg by mouth daily.   mirtazapine 7.5 MG tablet Commonly known as:  REMERON Take 7.5 mg by mouth. TAKE 1 TABLET BY MOUTH AT BEDTIME FOR APPETITE STIMULANT   polyethylene glycol packet Commonly known as:  MIRALAX / GLYCOLAX Take 17 g by mouth daily.   potassium chloride 10 MEQ tablet Commonly known as:  K-DUR,KLOR-CON Take 10 mEq by mouth every other day.       No orders of the defined types were placed in this encounter.   Immunization History  Administered Date(s)  Administered  . Influenza, High Dose Seasonal PF 03/31/2017    Social History   Tobacco Use  . Smoking status: Never Smoker  . Smokeless tobacco: Never Used  Substance Use Topics  . Alcohol use: No    Review of Systems  DATA OBTAINED: from patient, nurse GENERAL:  no fevers, fatigue, appetite changes SKIN: No itching, rash HEENT: No complaint RESPIRATORY: No cough, wheezing, SOB CARDIAC: No chest pain, palpitations, lower extremity edema  GI: No abdominal pain, No N/V/D or constipation, No heartburn or reflux  GU: No dysuria, frequency or urgency, or incontinence  MUSCULOSKELETAL: No unrelieved bone/joint pain NEUROLOGIC: No headache, dizziness  PSYCHIATRIC: No overt anxiety or sadness  Vitals:   09/04/18 1504  BP: 120/71  Pulse: 74  Resp: 18  Temp: 98 F (36.7 C)   Body mass index is 19.5 kg/m. Physical Exam  GENERAL APPEARANCE: Alert, conversant, No acute distress, in the hallway daily in her wheelchair SKIN: No diaphoresis rash HEENT: Unremarkable RESPIRATORY: Breathing is even, unlabored. Lung sounds are clear   CARDIOVASCULAR: Heart RRR no murmurs, rubs or gallops.  Trace to none peripheral edema  GASTROINTESTINAL: Abdomen is soft, non-tender, not distended w/ normal bowel sounds.  GENITOURINARY: Bladder non tender, not distended  MUSCULOSKELETAL: No abnormal joints or musculature NEUROLOGIC: Cranial nerves 2-12  grossly intact. Moves all extremities PSYCHIATRIC: Mood and affect appropriate to situation with mild dementia, no behavioral issues  Patient Active Problem List   Diagnosis Date Noted  . Cellulitis of left lower extremity 07/07/2018  . Pneumonia of lower lobe of lung (HCC) 02/09/2018  . Delirium 02/09/2018  . Chronic diastolic (congestive) heart failure (HCC) 02/09/2018  . Hematoma of left lower extremity 02/09/2018  . Acute on chronic diastolic CHF (congestive heart failure) (HCC) 01/30/2018  . Sepsis due to pneumonia (HCC) 01/30/2018  .  Agitation 01/30/2018  . Right leg injury, subsequent encounter 01/30/2018  . Benign hypertension with chronic kidney disease, stage III (HCC) 10/06/2017  . Chronic kidney disease, stage III (moderate) (HCC) 10/06/2017  . Restrictive airway disease 10/06/2017  . Fall 09/29/2017  . Acute renal failure superimposed on stage 3 chronic kidney disease (HCC) 09/29/2017  . Bilateral lower extremity edema 08/15/2015  . Paroxysmal supraventricular tachycardia (HCC) 12/06/2013  . Essential hypertension, benign 12/06/2013  . Syncope and collapse 12/06/2013    CMP     Component Value Date/Time   NA 145 02/02/2018 0627   NA 147 10/17/2017   K 3.1 (L) 02/02/2018 0627   CL 109 02/02/2018 0627   CO2 27 02/02/2018 0627   GLUCOSE 95 02/02/2018 0627   BUN 11 02/02/2018 0627   BUN 22 (A) 10/17/2017   CREATININE 0.94 02/02/2018 0627   CREATININE 1.07 (H) 08/15/2015 1422   CALCIUM 8.6 (L) 02/02/2018 0627   PROT 5.1 (L) 01/30/2018 1724   ALBUMIN 3.0 (L) 01/30/2018 1724   AST 43 (H) 01/30/2018 1724   ALT 15 01/30/2018 1724   ALKPHOS 55 01/30/2018 1724   BILITOT 1.8 (H) 01/30/2018 1724   GFRNONAA 50 (L) 02/02/2018 0627   GFRAA 58 (L) 02/02/2018 0627   Recent Labs    01/30/18 1724 02/01/18 0846 02/02/18 0627  NA 142 143 145  K 5.6* 3.2* 3.1*  CL 109 109 109  CO2 18* 25 27  GLUCOSE 115* 99 95  BUN 29* 16 11  CREATININE 1.84* 0.96 0.94  CALCIUM 8.4* 8.6* 8.6*   Recent Labs    09/30/17 0728 01/29/18 0952 01/30/18 1724  AST 108* 17 43*  ALT 36 12 15  ALKPHOS 43 51 55  BILITOT 1.1 0.8 1.8*  PROT 5.0* 5.6* 5.1*  ALBUMIN 2.5* 3.0* 3.0*   Recent Labs    09/29/17 1127  01/29/18 0952 01/30/18 2009 02/01/18 0846 02/02/18 0627  WBC 13.5*   < > 10.3 11.1* 8.3 8.4  NEUTROABS 11.4*  --  8.6* 8.0*  --   --   HGB 13.8   < > 13.5 12.0 12.8 12.3  HCT 42.6   < > 41.3 38.3 39.4 37.4  MCV 95.1   < > 95.6 98.5 95.4 93.5  PLT 252   < > 242 241 230 268   < > = values in this interval not  displayed.   No results for input(s): CHOL, LDLCALC, TRIG in the last 8760 hours.  Invalid input(s): HCL No results found for: [facility] Lab Results  Component Value Date   TSH 4.553 (H) 01/30/2018   No results found for: HGBA1C Lab Results  Component Value Date   CHOL 167 08/15/2015   HDL 65 08/15/2015   LDLCALC 87 08/15/2015   TRIG 76 08/15/2015   CHOLHDL 2.6 08/15/2015    Significant Diagnostic Results in last 30 days:  No results found.  Assessment and Plan  Essential hypertension, benign Controlled; continue Toprol-XL 25 mg  daily  Restrictive airway disease Stable; no reported use of inhalers or nebs; continue supportive care  Bilateral lower extremity edema Minimal to no edema; continue Lasix 20 mg every other day    [name]. [name], MD

## 2018-09-06 ENCOUNTER — Encounter: Payer: Self-pay | Admitting: Internal Medicine

## 2018-09-06 NOTE — Assessment & Plan Note (Signed)
Controlled; continue Toprol-XL 25 mg daily 

## 2018-09-06 NOTE — Assessment & Plan Note (Signed)
Stable; no reported use of inhalers or nebs; continue supportive care

## 2018-09-06 NOTE — Assessment & Plan Note (Signed)
Minimal to no edema; continue Lasix 20 mg every other day

## 2018-09-07 ENCOUNTER — Encounter (HOSPITAL_BASED_OUTPATIENT_CLINIC_OR_DEPARTMENT_OTHER): Payer: Medicare Other | Attending: Internal Medicine

## 2018-09-07 DIAGNOSIS — I872 Venous insufficiency (chronic) (peripheral): Secondary | ICD-10-CM | POA: Diagnosis not present

## 2018-09-07 DIAGNOSIS — Z872 Personal history of diseases of the skin and subcutaneous tissue: Secondary | ICD-10-CM | POA: Insufficient documentation

## 2018-09-07 DIAGNOSIS — I5030 Unspecified diastolic (congestive) heart failure: Secondary | ICD-10-CM | POA: Diagnosis not present

## 2018-09-07 DIAGNOSIS — Z09 Encounter for follow-up examination after completed treatment for conditions other than malignant neoplasm: Secondary | ICD-10-CM | POA: Insufficient documentation

## 2018-09-07 DIAGNOSIS — F0390 Unspecified dementia without behavioral disturbance: Secondary | ICD-10-CM | POA: Diagnosis not present

## 2018-09-07 DIAGNOSIS — R21 Rash and other nonspecific skin eruption: Secondary | ICD-10-CM | POA: Diagnosis not present

## 2018-09-07 DIAGNOSIS — N183 Chronic kidney disease, stage 3 (moderate): Secondary | ICD-10-CM | POA: Insufficient documentation

## 2018-09-16 DIAGNOSIS — R278 Other lack of coordination: Secondary | ICD-10-CM | POA: Diagnosis not present

## 2018-09-16 DIAGNOSIS — F0390 Unspecified dementia without behavioral disturbance: Secondary | ICD-10-CM | POA: Diagnosis not present

## 2018-09-16 DIAGNOSIS — R1312 Dysphagia, oropharyngeal phase: Secondary | ICD-10-CM | POA: Diagnosis not present

## 2018-09-16 DIAGNOSIS — N183 Chronic kidney disease, stage 3 (moderate): Secondary | ICD-10-CM | POA: Diagnosis not present

## 2018-09-17 ENCOUNTER — Non-Acute Institutional Stay (SKILLED_NURSING_FACILITY): Payer: Medicare Other | Admitting: Internal Medicine

## 2018-09-17 DIAGNOSIS — R238 Other skin changes: Secondary | ICD-10-CM | POA: Diagnosis not present

## 2018-09-17 DIAGNOSIS — L03116 Cellulitis of left lower limb: Secondary | ICD-10-CM

## 2018-09-18 ENCOUNTER — Encounter: Payer: Self-pay | Admitting: Internal Medicine

## 2018-09-18 NOTE — Progress Notes (Signed)
Opened in error

## 2018-09-18 NOTE — Progress Notes (Signed)
:  Location:  [facility] Room Number: 205D Place of Service:  SNF (31)  Madeline Parrish. [name], MD  Patient Care Team: Via, Caryn Bee, MD as PCP - General [facility] Medicine)  Extended Emergency Contact Information Primary Emergency Contact: McNeely,Richard Address: [address]          [place], [place] [address] [place] Home Phone: [phone] Relation: Brother     Allergies: Patient has no known allergies.  Chief Complaint  Patient presents with  . Acute Visit    HPI: Patient is 85 y.o. female who Is being seen for some lesions on her nose which are red and tender to palpation.  They are not in clusters.  Patient's had no fever chills nausea vomiting or any other systemic symptom.  Also her left leg, where she has had cellulitis before, is looking a little bit more red and has some mild heat.  Past Medical History:  Diagnosis Date  . Acne rosacea   . CKD (chronic kidney disease), stage III (HCC)   . Depression   . Hypertension   . Osteopenia   . PUD (peptic ulcer disease)   . Restrictive lung disease   . Shingles   . Tinnitus    and vertigo    History reviewed. No pertinent surgical history.  Allergies as of 09/17/2018   No Known Allergies     Medication List       Accurate as of September 17, 2018 11:59 PM. Always use your most recent med list.        Centrum Silver 50+Women Tabs Take 1 tablet by mouth daily.   doxycycline 100 MG tablet Commonly known as:  VIBRA-TABS Take 100 mg by mouth 2 (two) times daily.   furosemide 20 MG tablet Commonly known as:  LASIX Take 20 mg by mouth every other day.   metoprolol succinate 25 MG 24 hr tablet Commonly known as:  TOPROL-XL Take 25 mg by mouth daily.   mirtazapine 7.5 MG tablet Commonly known as:  REMERON Take 7.5 mg by mouth. TAKE 1 TABLET BY MOUTH AT BEDTIME FOR APPETITE STIMULANT   polyethylene glycol packet Commonly known as:  MIRALAX / GLYCOLAX Take 17 g by  mouth daily.   potassium chloride 10 MEQ tablet Commonly known as:  K-DUR,KLOR-CON Take 10 mEq by mouth every other day.       No orders of the defined types were placed in this encounter.   Immunization History  Administered Date(s) Administered  . Influenza, High Dose Seasonal PF 03/31/2017    Social History   Tobacco Use  . Smoking status: Never Smoker  . Smokeless tobacco: Never Used  Substance Use Topics  . Alcohol use: No    Family history is   Family History  Problem Relation Age of Onset  . CVA Mother       Review of Systems  DATA OBTAINED: from patient, nurse GENERAL:  no fevers, fatigue, appetite changes SKIN: Lesions on nose and redness to left lower extremity as per history present illness EYES: No eye pain, redness, discharge EARS: No earache, tinnitus, change in hearing NOSE: No congestion, drainage or bleeding  MOUTH/THROAT: No mouth or tooth pain, No sore throat RESPIRATORY: No cough, wheezing, SOB CARDIAC: No chest pain, palpitations, lower extremity edema  GI: No abdominal pain, No N/V/D or constipation, No heartburn or reflux  GU: No dysuria, frequency or urgency, or incontinence  MUSCULOSKELETAL: No unrelieved bone/joint pain NEUROLOGIC: No  headache, dizziness or focal weakness PSYCHIATRIC: No c/o anxiety or sadness   Vitals:   09/18/18 1514  BP: 110/60  Pulse: 70  Resp: 18  Temp: 98.5 F (36.9 C)    SpO2 Readings from Last 1 Encounters:  02/02/18 97%   Body mass index is 18.47 kg/m.     Physical Exam  GENERAL APPEARANCE: Alert, conversant,  No acute distress.  SKIN: Nose with discrete pink papules that are tender to palpation but not ripe; left leg with slight erythema and heat compared to prior on dorsal surface HEAD: Normocephalic, atraumatic  EYES: Conjunctiva/lids clear. Pupils round, reactive. EOMs intact.  EARS: External exam WNL, canals clear. Hearing grossly normal.  NOSE: No deformity or discharge.   MOUTH/THROAT: Lips w/o lesions  RESPIRATORY: Breathing is even, unlabored. Lung sounds are clear   CARDIOVASCULAR: Heart RRR no murmurs, rubs or gallops. No peripheral edema.   GASTROINTESTINAL: Abdomen is soft, non-tender, not distended w/ normal bowel sounds. GENITOURINARY: Bladder non tender, not distended  MUSCULOSKELETAL: No abnormal joints or musculature NEUROLOGIC:  Cranial nerves 2-12 grossly intact. Moves all extremities  PSYCHIATRIC: Mood and affect appropriate to situation with some dementia, no behavioral issues  Patient Active Problem List   Diagnosis Date Noted  . Cellulitis of left lower extremity 07/07/2018  . Pneumonia of lower lobe of lung (HCC) 02/09/2018  . Delirium 02/09/2018  . Chronic diastolic (congestive) heart failure (HCC) 02/09/2018  . Hematoma of left lower extremity 02/09/2018  . Acute on chronic diastolic CHF (congestive heart failure) (HCC) 01/30/2018  . Sepsis due to pneumonia (HCC) 01/30/2018  . Agitation 01/30/2018  . Right leg injury, subsequent encounter 01/30/2018  . Benign hypertension with chronic kidney disease, stage III (HCC) 10/06/2017  . Chronic kidney disease, stage III (moderate) (HCC) 10/06/2017  . Restrictive airway disease 10/06/2017  . Fall 09/29/2017  . Acute renal failure superimposed on stage 3 chronic kidney disease (HCC) 09/29/2017  . Bilateral lower extremity edema 08/15/2015  . Paroxysmal supraventricular tachycardia (HCC) 12/06/2013  . Essential hypertension, benign 12/06/2013  . Syncope and collapse 12/06/2013      Labs reviewed: Basic Metabolic Panel:    Component Value Date/Time   NA 145 02/02/2018 0627   NA 147 10/17/2017   K 3.1 (L) 02/02/2018 0627   CL 109 02/02/2018 0627   CO2 27 02/02/2018 0627   GLUCOSE 95 02/02/2018 0627   BUN 11 02/02/2018 0627   BUN 22 (A) 10/17/2017   CREATININE 0.94 02/02/2018 0627   CREATININE 1.07 (H) 08/15/2015 1422   CALCIUM 8.6 (L) 02/02/2018 0627   PROT 5.1 (L) 01/30/2018  1724   ALBUMIN 3.0 (L) 01/30/2018 1724   AST 43 (H) 01/30/2018 1724   ALT 15 01/30/2018 1724   ALKPHOS 55 01/30/2018 1724   BILITOT 1.8 (H) 01/30/2018 1724   GFRNONAA 50 (L) 02/02/2018 0627   GFRAA 58 (L) 02/02/2018 0627    Recent Labs    01/30/18 1724 02/01/18 0846 02/02/18 0627  NA 142 143 145  K 5.6* 3.2* 3.1*  CL 109 109 109  CO2 18* 25 27  GLUCOSE 115* 99 95  BUN 29* 16 11  CREATININE 1.84* 0.96 0.94  CALCIUM 8.4* 8.6* 8.6*   Liver Function Tests: Recent Labs    09/30/17 0728 01/29/18 0952 01/30/18 1724  AST 108* 17 43*  ALT 36 12 15  ALKPHOS 43 51 55  BILITOT 1.1 0.8 1.8*  PROT 5.0* 5.6* 5.1*  ALBUMIN 2.5* 3.0* 3.0*   No results for  input(s): LIPASE, AMYLASE in the last 8760 hours. No results for input(s): AMMONIA in the last 8760 hours. CBC: Recent Labs    09/29/17 1127  01/29/18 0952 01/30/18 2009 02/01/18 0846 02/02/18 0627  WBC 13.5*   < > 10.3 11.1* 8.3 8.4  NEUTROABS 11.4*  --  8.6* 8.0*  --   --   HGB 13.8   < > 13.5 12.0 12.8 12.3  HCT 42.6   < > 41.3 38.3 39.4 37.4  MCV 95.1   < > 95.6 98.5 95.4 93.5  PLT 252   < > 242 241 230 268   < > = values in this interval not displayed.   Lipid No results for input(s): CHOL, HDL, LDLCALC, TRIG in the last 8760 hours.  Cardiac Enzymes: Recent Labs    09/30/17 0229 09/30/17 0728 10/02/17 0627 10/03/17 0739 01/29/18 0952 01/30/18 1724  CKTOTAL  --  3,002* 1,109* 544*  --  176  TROPONINI 0.88* 0.73*  --   --  <0.03  --    BNP: No results for input(s): BNP in the last 8760 hours. No results found for: MICROALBUR No results found for: HGBA1C Lab Results  Component Value Date   TSH 4.553 (H) 01/30/2018   No results found for: VITAMINB12 No results found for: FOLATE No results found for: IRON, TIBC, FERRITIN  Imaging and Procedures obtained prior to SNF admission: Dg Chest Port 1 View  Result Date: 01/30/2018 CLINICAL DATA:  85 year old found lethargic and unresponsive at the nursing  home. Hypotensive upon EMS arrival (60/30). EXAM: PORTABLE CHEST 1 VIEW COMPARISON:  01/29/2018, 09/29/2017. FINDINGS: Cardiac silhouette moderately to markedly enlarged. Thoracic aorta tortuous and atherosclerotic. Interval development of mild diffuse interstitial pulmonary edema. More focal patchy airspace opacities at the LEFT lung base. No focal airspace opacities elsewhere. IMPRESSION: 1. Stable moderate to marked cardiomegaly. Mild diffuse interstitial pulmonary edema indicates mild CHF and/or fluid overload. 2. Focal airspace opacities at the LEFT lung base, query superimposed pneumonia. Electronically Signed   By: Hulan Saas M.D.   On: 01/30/2018 18:04     Not all labs, radiology exams or other studies done during hospitalization come through on my EPIC note; however they are reviewed by me.    Assessment and Plan  Papules nose/early early cellulitis left lower extremity- papules not ripe; will treat with doxycycline 100 mg twice daily for 7 days; monitor response    Thurston Hole D. [name], MD

## 2018-09-18 NOTE — Progress Notes (Signed)
:  Location:  [facility] Room Number: [id number] Place of Service:  SNF (31)  [name]. [name], MD  Patient Care Team: Via, Caryn Bee, MD as PCP - General [facility] Medicine)  Extended Emergency Contact Information Primary Emergency Contact: McNeely,Richard Address: [address]          [place], [place] [address] [place] Home Phone: [phone] Relation: Brother     Allergies: Patient has no known allergies.  Chief Complaint  Patient presents with  . Acute Visit    HPI: Patient is 85 y.o. female who  Past Medical History:  Diagnosis Date  . Acne rosacea   . CKD (chronic kidney disease), stage III (HCC)   . Depression   . Hypertension   . Osteopenia   . PUD (peptic ulcer disease)   . Restrictive lung disease   . Shingles   . Tinnitus    and vertigo    History reviewed. No pertinent surgical history.  Allergies as of 09/18/2018   No Known Allergies     Medication List       Accurate as of September 18, 2018  3:30 PM. Always use your most recent med list.        Centrum Silver 50+Women Tabs Take 1 tablet by mouth daily.   doxycycline 100 MG tablet Commonly known as:  VIBRA-TABS Take 100 mg by mouth 2 (two) times daily.   furosemide 20 MG tablet Commonly known as:  LASIX Take 20 mg by mouth every other day.   metoprolol succinate 25 MG 24 hr tablet Commonly known as:  TOPROL-XL Take 25 mg by mouth daily.   mirtazapine 7.5 MG tablet Commonly known as:  REMERON Take 7.5 mg by mouth. TAKE 1 TABLET BY MOUTH AT BEDTIME FOR APPETITE STIMULANT   polyethylene glycol packet Commonly known as:  MIRALAX / GLYCOLAX Take 17 g by mouth daily.   potassium chloride 10 MEQ tablet Commonly known as:  K-DUR,KLOR-CON Take 10 mEq by mouth every other day.       No orders of the defined types were placed in this encounter.   Immunization History  Administered Date(s) Administered  . Influenza, High Dose Seasonal PF  03/31/2017    Social History   Tobacco Use  . Smoking status: Never Smoker  . Smokeless tobacco: Never Used  Substance Use Topics  . Alcohol use: No    Family history is   Family History  Problem Relation Age of Onset  . CVA Mother       Review of Systems  DATA OBTAINED: from patient, nurse, medical record, family member GENERAL:  no fevers, fatigue, appetite changes SKIN: No itching, or rash EYES: No eye pain, redness, discharge EARS: No earache, tinnitus, change in hearing NOSE: No congestion, drainage or bleeding  MOUTH/THROAT: No mouth or tooth pain, No sore throat RESPIRATORY: No cough, wheezing, SOB CARDIAC: No chest pain, palpitations, lower extremity edema  GI: No abdominal pain, No N/V/D or constipation, No heartburn or reflux  GU: No dysuria, frequency or urgency, or incontinence  MUSCULOSKELETAL: No unrelieved bone/joint pain NEUROLOGIC: No headache, dizziness or focal weakness PSYCHIATRIC: No c/o anxiety or sadness   Vitals:   09/18/18 1529  BP: 122/70  Pulse: 78  Resp: 18  Temp: 98.6 F (37 C)    SpO2 Readings from Last 1 Encounters:  02/02/18 97%   Body mass index is 19.06 kg/m.     Physical Exam  GENERAL APPEARANCE: Alert,  conversant,  No acute distress.  SKIN: No diaphoresis rash HEAD: Normocephalic, atraumatic  EYES: Conjunctiva/lids clear. Pupils round, reactive. EOMs intact.  EARS: External exam WNL, canals clear. Hearing grossly normal.  NOSE: No deformity or discharge.  MOUTH/THROAT: Lips w/o lesions  RESPIRATORY: Breathing is even, unlabored. Lung sounds are clear   CARDIOVASCULAR: Heart RRR no murmurs, rubs or gallops. No peripheral edema.   GASTROINTESTINAL: Abdomen is soft, non-tender, not distended w/ normal bowel sounds. GENITOURINARY: Bladder non tender, not distended  MUSCULOSKELETAL: No abnormal joints or musculature NEUROLOGIC:  Cranial nerves 2-12 grossly intact. Moves all extremities  PSYCHIATRIC: Mood and affect  appropriate to situation, no behavioral issues  Patient Active Problem List   Diagnosis Date Noted  . Cellulitis of left lower extremity 07/07/2018  . Pneumonia of lower lobe of lung (HCC) 02/09/2018  . Delirium 02/09/2018  . Chronic diastolic (congestive) heart failure (HCC) 02/09/2018  . Hematoma of left lower extremity 02/09/2018  . Acute on chronic diastolic CHF (congestive heart failure) (HCC) 01/30/2018  . Sepsis due to pneumonia (HCC) 01/30/2018  . Agitation 01/30/2018  . Right leg injury, subsequent encounter 01/30/2018  . Benign hypertension with chronic kidney disease, stage III (HCC) 10/06/2017  . Chronic kidney disease, stage III (moderate) (HCC) 10/06/2017  . Restrictive airway disease 10/06/2017  . Fall 09/29/2017  . Acute renal failure superimposed on stage 3 chronic kidney disease (HCC) 09/29/2017  . Bilateral lower extremity edema 08/15/2015  . Paroxysmal supraventricular tachycardia (HCC) 12/06/2013  . Essential hypertension, benign 12/06/2013  . Syncope and collapse 12/06/2013      Labs reviewed: Basic Metabolic Panel:    Component Value Date/Time   NA 145 02/02/2018 0627   NA 147 10/17/2017   K 3.1 (L) 02/02/2018 0627   CL 109 02/02/2018 0627   CO2 27 02/02/2018 0627   GLUCOSE 95 02/02/2018 0627   BUN 11 02/02/2018 0627   BUN 22 (A) 10/17/2017   CREATININE 0.94 02/02/2018 0627   CREATININE 1.07 (H) 08/15/2015 1422   CALCIUM 8.6 (L) 02/02/2018 0627   PROT 5.1 (L) 01/30/2018 1724   ALBUMIN 3.0 (L) 01/30/2018 1724   AST 43 (H) 01/30/2018 1724   ALT 15 01/30/2018 1724   ALKPHOS 55 01/30/2018 1724   BILITOT 1.8 (H) 01/30/2018 1724   GFRNONAA 50 (L) 02/02/2018 0627   GFRAA 58 (L) 02/02/2018 0627    Recent Labs    01/30/18 1724 02/01/18 0846 02/02/18 0627  NA 142 143 145  K 5.6* 3.2* 3.1*  CL 109 109 109  CO2 18* 25 27  GLUCOSE 115* 99 95  BUN 29* 16 11  CREATININE 1.84* 0.96 0.94  CALCIUM 8.4* 8.6* 8.6*   Liver Function Tests: Recent  Labs    09/30/17 0728 01/29/18 0952 01/30/18 1724  AST 108* 17 43*  ALT 36 12 15  ALKPHOS 43 51 55  BILITOT 1.1 0.8 1.8*  PROT 5.0* 5.6* 5.1*  ALBUMIN 2.5* 3.0* 3.0*   No results for input(s): LIPASE, AMYLASE in the last 8760 hours. No results for input(s): AMMONIA in the last 8760 hours. CBC: Recent Labs    09/29/17 1127  01/29/18 0952 01/30/18 2009 02/01/18 0846 02/02/18 0627  WBC 13.5*   < > 10.3 11.1* 8.3 8.4  NEUTROABS 11.4*  --  8.6* 8.0*  --   --   HGB 13.8   < > 13.5 12.0 12.8 12.3  HCT 42.6   < > 41.3 38.3 39.4 37.4  MCV 95.1   < >  95.6 98.5 95.4 93.5  PLT 252   < > 242 241 230 268   < > = values in this interval not displayed.   Lipid No results for input(s): CHOL, HDL, LDLCALC, TRIG in the last 8760 hours.  Cardiac Enzymes: Recent Labs    09/30/17 0229 09/30/17 0728 10/02/17 0627 10/03/17 0739 01/29/18 0952 01/30/18 1724  CKTOTAL  --  3,002* 1,109* 544*  --  176  TROPONINI 0.88* 0.73*  --   --  <0.03  --    BNP: No results for input(s): BNP in the last 8760 hours. No results found for: MICROALBUR No results found for: HGBA1C Lab Results  Component Value Date   TSH 4.553 (H) 01/30/2018   No results found for: VITAMINB12 No results found for: FOLATE No results found for: IRON, TIBC, FERRITIN  Imaging and Procedures obtained prior to SNF admission: Dg Chest Port 1 View  Result Date: 01/30/2018 CLINICAL DATA:  85 year old found lethargic and unresponsive at the nursing home. Hypotensive upon EMS arrival (60/30). EXAM: PORTABLE CHEST 1 VIEW COMPARISON:  01/29/2018, 09/29/2017. FINDINGS: Cardiac silhouette moderately to markedly enlarged. Thoracic aorta tortuous and atherosclerotic. Interval development of mild diffuse interstitial pulmonary edema. More focal patchy airspace opacities at the LEFT lung base. No focal airspace opacities elsewhere. IMPRESSION: 1. Stable moderate to marked cardiomegaly. Mild diffuse interstitial pulmonary edema indicates  mild CHF and/or fluid overload. 2. Focal airspace opacities at the LEFT lung base, query superimposed pneumonia. Electronically Signed   By: Hulan Saas M.D.   On: 01/30/2018 18:04     Not all labs, radiology exams or other studies done during hospitalization come through on my EPIC note; however they are reviewed by me.    Assessment and Plan  No problem-specific Assessment & Plan notes found for this encounter.   [name]. [name], MD

## 2018-09-21 DIAGNOSIS — N183 Chronic kidney disease, stage 3 (moderate): Secondary | ICD-10-CM | POA: Diagnosis not present

## 2018-09-21 DIAGNOSIS — R278 Other lack of coordination: Secondary | ICD-10-CM | POA: Diagnosis not present

## 2018-09-21 DIAGNOSIS — R1312 Dysphagia, oropharyngeal phase: Secondary | ICD-10-CM | POA: Diagnosis not present

## 2018-09-21 DIAGNOSIS — F0390 Unspecified dementia without behavioral disturbance: Secondary | ICD-10-CM | POA: Diagnosis not present

## 2018-09-22 DIAGNOSIS — R1312 Dysphagia, oropharyngeal phase: Secondary | ICD-10-CM | POA: Diagnosis not present

## 2018-09-22 DIAGNOSIS — N183 Chronic kidney disease, stage 3 (moderate): Secondary | ICD-10-CM | POA: Diagnosis not present

## 2018-09-22 DIAGNOSIS — F0390 Unspecified dementia without behavioral disturbance: Secondary | ICD-10-CM | POA: Diagnosis not present

## 2018-09-22 DIAGNOSIS — R278 Other lack of coordination: Secondary | ICD-10-CM | POA: Diagnosis not present

## 2018-09-23 DIAGNOSIS — R278 Other lack of coordination: Secondary | ICD-10-CM | POA: Diagnosis not present

## 2018-09-23 DIAGNOSIS — F0390 Unspecified dementia without behavioral disturbance: Secondary | ICD-10-CM | POA: Diagnosis not present

## 2018-09-23 DIAGNOSIS — R1312 Dysphagia, oropharyngeal phase: Secondary | ICD-10-CM | POA: Diagnosis not present

## 2018-09-23 DIAGNOSIS — N183 Chronic kidney disease, stage 3 (moderate): Secondary | ICD-10-CM | POA: Diagnosis not present

## 2018-09-24 ENCOUNTER — Encounter: Payer: Self-pay | Admitting: Internal Medicine

## 2018-09-26 NOTE — Progress Notes (Signed)
This encounter was created in error - please disregard.

## 2018-09-29 DIAGNOSIS — R278 Other lack of coordination: Secondary | ICD-10-CM | POA: Diagnosis not present

## 2018-09-29 DIAGNOSIS — N183 Chronic kidney disease, stage 3 (moderate): Secondary | ICD-10-CM | POA: Diagnosis not present

## 2018-09-29 DIAGNOSIS — R1312 Dysphagia, oropharyngeal phase: Secondary | ICD-10-CM | POA: Diagnosis not present

## 2018-09-29 DIAGNOSIS — F0390 Unspecified dementia without behavioral disturbance: Secondary | ICD-10-CM | POA: Diagnosis not present

## 2018-09-30 DIAGNOSIS — R278 Other lack of coordination: Secondary | ICD-10-CM | POA: Diagnosis not present

## 2018-09-30 DIAGNOSIS — N183 Chronic kidney disease, stage 3 (moderate): Secondary | ICD-10-CM | POA: Diagnosis not present

## 2018-09-30 DIAGNOSIS — R1312 Dysphagia, oropharyngeal phase: Secondary | ICD-10-CM | POA: Diagnosis not present

## 2018-09-30 DIAGNOSIS — F0390 Unspecified dementia without behavioral disturbance: Secondary | ICD-10-CM | POA: Diagnosis not present

## 2018-10-01 ENCOUNTER — Non-Acute Institutional Stay (SKILLED_NURSING_FACILITY): Payer: Medicare Other | Admitting: Adult Health

## 2018-10-01 ENCOUNTER — Encounter: Payer: Self-pay | Admitting: Adult Health

## 2018-10-01 DIAGNOSIS — I5032 Chronic diastolic (congestive) heart failure: Secondary | ICD-10-CM

## 2018-10-01 DIAGNOSIS — F0390 Unspecified dementia without behavioral disturbance: Secondary | ICD-10-CM | POA: Diagnosis not present

## 2018-10-01 DIAGNOSIS — J984 Other disorders of lung: Secondary | ICD-10-CM | POA: Diagnosis not present

## 2018-10-01 DIAGNOSIS — I129 Hypertensive chronic kidney disease with stage 1 through stage 4 chronic kidney disease, or unspecified chronic kidney disease: Secondary | ICD-10-CM

## 2018-10-01 DIAGNOSIS — K5909 Other constipation: Secondary | ICD-10-CM | POA: Diagnosis not present

## 2018-10-01 DIAGNOSIS — N183 Chronic kidney disease, stage 3 (moderate): Secondary | ICD-10-CM

## 2018-10-01 DIAGNOSIS — I10 Essential (primary) hypertension: Secondary | ICD-10-CM

## 2018-10-01 DIAGNOSIS — R627 Adult failure to thrive: Secondary | ICD-10-CM

## 2018-10-01 DIAGNOSIS — R1312 Dysphagia, oropharyngeal phase: Secondary | ICD-10-CM | POA: Diagnosis not present

## 2018-10-01 DIAGNOSIS — R278 Other lack of coordination: Secondary | ICD-10-CM | POA: Diagnosis not present

## 2018-10-01 NOTE — Progress Notes (Signed)
Location:    [facility] Room Number: 205D Place of Service:  SNF (31)   CODE STATUS: dnr  No Known Allergies  Chief Complaint  Patient presents with   Medical Management of Chronic Issues    Benign hypertension with chronic kidney disease stage III: essential hypertension; benign; chronic diastolic (congestive) heart failure     HPI:  She is a 85 year old long term resident of this facility being seen for the management of her chronic illnesses: hypertensive heart disease; hypertension; chf. She denies any cough of shortness of breath; no uncontrolled pain. There are no reports of fevers present.   Past Medical History:  Diagnosis Date   Acne rosacea    CKD (chronic kidney disease), stage III (HCC)    Depression    Hypertension    Osteopenia    PUD (peptic ulcer disease)    Restrictive lung disease    Shingles    Tinnitus    and vertigo    History reviewed. No pertinent surgical history.  Social History   Socioeconomic History   Marital status: Divorced    Spouse name: Not on file   Number of children: Not on file   Years of education: Not on file   Highest education level: Not on file  Occupational History   Occupation: retired  Ecologist strain: Not on file   Food insecurity:    Worry: Not on file    Inability: Not on file   Transportation needs:    Medical: Not on file    Non-medical: Not on file  Tobacco Use   Smoking status: Never Smoker   Smokeless tobacco: Never Used  Substance and Sexual Activity   Alcohol use: No   Drug use: No   Sexual activity: Not on file  Lifestyle   Physical activity:    Days per week: Not on file    Minutes per session: Not on file   Stress: Not on file  Relationships   Social connections:    Talks on phone: Not on file    Gets together: Not on file    Attends religious service: Not on file    Active member of club or  organization: Not on file    Attends meetings of clubs or organizations: Not on file    Relationship status: Not on file   Intimate partner violence:    Fear of current or ex partner: Not on file    Emotionally abused: Not on file    Physically abused: Not on file    Forced sexual activity: Not on file  Other Topics Concern   Not on file  Social History Narrative   Not on file   Family History  Problem Relation Age of Onset   CVA Mother       VITAL SIGNS BP 132/70    Pulse 69    Temp 98 F (36.7 C)    Resp 18    Ht 5\' 4"  (1.626 m)    Wt 110 lb 6.4 oz (50.1 kg)    BMI 18.95 kg/m   Outpatient Encounter Medications as of 10/01/2018  Medication Sig   furosemide (LASIX) 20 MG tablet Take 20 mg by mouth every other day.    metoprolol succinate (TOPROL-XL) 25 MG 24 hr tablet Take 25 mg by mouth daily.    mirtazapine (REMERON) 7.5 MG tablet Take 7.5 mg by mouth. TAKE 1 TABLET BY MOUTH AT  BEDTIME FOR APPETITE STIMULANT   Multiple Vitamins-Minerals (CENTRUM SILVER 50+WOMEN) TABS Take 1 tablet by mouth daily.   polyethylene glycol (MIRALAX / GLYCOLAX) packet Take 17 g by mouth daily.   potassium chloride (K-DUR,KLOR-CON) 10 MEQ tablet Take 10 mEq by mouth every other day.    No facility-administered encounter medications on file as of 10/01/2018.      SIGNIFICANT DIAGNOSTIC EXAMS  NONE RECENT    Review of Systems  Constitutional: Negative for malaise/fatigue.  Respiratory: Negative for cough and shortness of breath.   Cardiovascular: Negative for chest pain, palpitations and leg swelling.  Gastrointestinal: Negative for abdominal pain, constipation and heartburn.  Musculoskeletal: Negative for back pain, joint pain and myalgias.  Skin: Negative.   Neurological: Negative for dizziness.  Psychiatric/Behavioral: The patient is not nervous/anxious.    Physical Exam Constitutional:      General: She is not in acute distress.    Appearance: She is underweight. She is not  diaphoretic.  Neck:     Musculoskeletal: Neck supple.     Thyroid: No thyromegaly.  Cardiovascular:     Rate and Rhythm: Normal rate and regular rhythm.     Pulses: Normal pulses.     Heart sounds: Normal heart sounds.  Pulmonary:     Effort: Pulmonary effort is normal. No respiratory distress.     Breath sounds: Normal breath sounds.  Abdominal:     General: Bowel sounds are normal. There is no distension.     Palpations: Abdomen is soft.     Tenderness: There is no abdominal tenderness.  Musculoskeletal:     Right lower leg: No edema.     Left lower leg: No edema.     Comments: Is able to move all extremities   Lymphadenopathy:     Cervical: No cervical adenopathy.  Skin:    General: Skin is warm and dry.  Neurological:     Mental Status: She is alert. Mental status is at baseline.  Psychiatric:        Mood and Affect: Mood normal.      ASSESSMENT/ PLAN:  TODAY:   1. Paroxsymal supraventricular tachycardia: heart rate is stable will continue toprol xl 25 mg daily for rate control   2. Benign hypertensive with chronic kidney disease stage III: is stable will monitor  3. Essential hypertension benign: is stable b/p 132/70: will continue toprol xl 25 mg daily   4. Chronic diastolic (congestive) heart failure: is stable will continue lasix 20 mg every other day with k+ 10 meq every other day   5. Restrictive airway disease: is stable will monitor   6. Failure to thrive in adult: is without change weight is 110 pounds; will continue remeron 7.5 mg nightly for appetite will monitor  7. Chronic constipation is stable will continue miralax daily   Will check cbc; cmp       MD is aware of resident's narcotic use and is in agreement with current plan of care. We will attempt to wean resident as apropriate   Synthia Innocent NP [facility]  Contact [phone] Monday through Friday 8am- 5pm  After hours call [phone]

## 2018-10-02 DIAGNOSIS — F0390 Unspecified dementia without behavioral disturbance: Secondary | ICD-10-CM | POA: Diagnosis not present

## 2018-10-02 DIAGNOSIS — I10 Essential (primary) hypertension: Secondary | ICD-10-CM | POA: Diagnosis not present

## 2018-10-02 DIAGNOSIS — D649 Anemia, unspecified: Secondary | ICD-10-CM | POA: Diagnosis not present

## 2018-10-02 DIAGNOSIS — R278 Other lack of coordination: Secondary | ICD-10-CM | POA: Diagnosis not present

## 2018-10-02 DIAGNOSIS — R1312 Dysphagia, oropharyngeal phase: Secondary | ICD-10-CM | POA: Diagnosis not present

## 2018-10-02 DIAGNOSIS — N183 Chronic kidney disease, stage 3 (moderate): Secondary | ICD-10-CM | POA: Diagnosis not present

## 2018-10-05 DIAGNOSIS — K5909 Other constipation: Secondary | ICD-10-CM | POA: Insufficient documentation

## 2018-10-05 DIAGNOSIS — N183 Chronic kidney disease, stage 3 (moderate): Secondary | ICD-10-CM | POA: Diagnosis not present

## 2018-10-05 DIAGNOSIS — R278 Other lack of coordination: Secondary | ICD-10-CM | POA: Diagnosis not present

## 2018-10-05 DIAGNOSIS — F0390 Unspecified dementia without behavioral disturbance: Secondary | ICD-10-CM | POA: Diagnosis not present

## 2018-10-05 DIAGNOSIS — R1312 Dysphagia, oropharyngeal phase: Secondary | ICD-10-CM | POA: Diagnosis not present

## 2018-10-05 DIAGNOSIS — R627 Adult failure to thrive: Secondary | ICD-10-CM | POA: Insufficient documentation

## 2018-10-06 DIAGNOSIS — R278 Other lack of coordination: Secondary | ICD-10-CM | POA: Diagnosis not present

## 2018-10-06 DIAGNOSIS — R1312 Dysphagia, oropharyngeal phase: Secondary | ICD-10-CM | POA: Diagnosis not present

## 2018-10-06 DIAGNOSIS — F0390 Unspecified dementia without behavioral disturbance: Secondary | ICD-10-CM | POA: Diagnosis not present

## 2018-10-06 DIAGNOSIS — N183 Chronic kidney disease, stage 3 (moderate): Secondary | ICD-10-CM | POA: Diagnosis not present

## 2018-10-07 DIAGNOSIS — N183 Chronic kidney disease, stage 3 (moderate): Secondary | ICD-10-CM | POA: Diagnosis not present

## 2018-10-07 DIAGNOSIS — R278 Other lack of coordination: Secondary | ICD-10-CM | POA: Diagnosis not present

## 2018-10-07 DIAGNOSIS — F0390 Unspecified dementia without behavioral disturbance: Secondary | ICD-10-CM | POA: Diagnosis not present

## 2018-10-07 DIAGNOSIS — R1312 Dysphagia, oropharyngeal phase: Secondary | ICD-10-CM | POA: Diagnosis not present

## 2018-10-08 DIAGNOSIS — N183 Chronic kidney disease, stage 3 (moderate): Secondary | ICD-10-CM | POA: Diagnosis not present

## 2018-10-08 DIAGNOSIS — R1312 Dysphagia, oropharyngeal phase: Secondary | ICD-10-CM | POA: Diagnosis not present

## 2018-10-08 DIAGNOSIS — F0390 Unspecified dementia without behavioral disturbance: Secondary | ICD-10-CM | POA: Diagnosis not present

## 2018-10-08 DIAGNOSIS — R278 Other lack of coordination: Secondary | ICD-10-CM | POA: Diagnosis not present

## 2018-10-09 DIAGNOSIS — F0390 Unspecified dementia without behavioral disturbance: Secondary | ICD-10-CM | POA: Diagnosis not present

## 2018-10-09 DIAGNOSIS — R278 Other lack of coordination: Secondary | ICD-10-CM | POA: Diagnosis not present

## 2018-10-09 DIAGNOSIS — R1312 Dysphagia, oropharyngeal phase: Secondary | ICD-10-CM | POA: Diagnosis not present

## 2018-10-09 DIAGNOSIS — N183 Chronic kidney disease, stage 3 (moderate): Secondary | ICD-10-CM | POA: Diagnosis not present

## 2018-10-12 DIAGNOSIS — N183 Chronic kidney disease, stage 3 (moderate): Secondary | ICD-10-CM | POA: Diagnosis not present

## 2018-10-12 DIAGNOSIS — R278 Other lack of coordination: Secondary | ICD-10-CM | POA: Diagnosis not present

## 2018-10-12 DIAGNOSIS — R1312 Dysphagia, oropharyngeal phase: Secondary | ICD-10-CM | POA: Diagnosis not present

## 2018-10-12 DIAGNOSIS — F0390 Unspecified dementia without behavioral disturbance: Secondary | ICD-10-CM | POA: Diagnosis not present

## 2018-10-13 DIAGNOSIS — N183 Chronic kidney disease, stage 3 (moderate): Secondary | ICD-10-CM | POA: Diagnosis not present

## 2018-10-13 DIAGNOSIS — R1312 Dysphagia, oropharyngeal phase: Secondary | ICD-10-CM | POA: Diagnosis not present

## 2018-10-13 DIAGNOSIS — R278 Other lack of coordination: Secondary | ICD-10-CM | POA: Diagnosis not present

## 2018-10-13 DIAGNOSIS — F0390 Unspecified dementia without behavioral disturbance: Secondary | ICD-10-CM | POA: Diagnosis not present

## 2018-10-14 DIAGNOSIS — R278 Other lack of coordination: Secondary | ICD-10-CM | POA: Diagnosis not present

## 2018-10-14 DIAGNOSIS — F0390 Unspecified dementia without behavioral disturbance: Secondary | ICD-10-CM | POA: Diagnosis not present

## 2018-10-14 DIAGNOSIS — N183 Chronic kidney disease, stage 3 (moderate): Secondary | ICD-10-CM | POA: Diagnosis not present

## 2018-10-14 DIAGNOSIS — R1312 Dysphagia, oropharyngeal phase: Secondary | ICD-10-CM | POA: Diagnosis not present

## 2018-10-15 ENCOUNTER — Encounter: Payer: Self-pay | Admitting: Internal Medicine

## 2018-10-15 ENCOUNTER — Non-Acute Institutional Stay (SKILLED_NURSING_FACILITY): Payer: Medicare Other | Admitting: Internal Medicine

## 2018-10-15 DIAGNOSIS — R627 Adult failure to thrive: Secondary | ICD-10-CM

## 2018-10-15 DIAGNOSIS — I5032 Chronic diastolic (congestive) heart failure: Secondary | ICD-10-CM

## 2018-10-15 DIAGNOSIS — N183 Chronic kidney disease, stage 3 unspecified: Secondary | ICD-10-CM

## 2018-10-15 DIAGNOSIS — I129 Hypertensive chronic kidney disease with stage 1 through stage 4 chronic kidney disease, or unspecified chronic kidney disease: Secondary | ICD-10-CM | POA: Diagnosis not present

## 2018-10-15 DIAGNOSIS — R1312 Dysphagia, oropharyngeal phase: Secondary | ICD-10-CM | POA: Diagnosis not present

## 2018-10-15 DIAGNOSIS — I471 Supraventricular tachycardia: Secondary | ICD-10-CM | POA: Diagnosis not present

## 2018-10-15 DIAGNOSIS — Z710 Person encountering health services to consult on behalf of another person: Secondary | ICD-10-CM

## 2018-10-15 DIAGNOSIS — F0390 Unspecified dementia without behavioral disturbance: Secondary | ICD-10-CM | POA: Diagnosis not present

## 2018-10-15 DIAGNOSIS — R278 Other lack of coordination: Secondary | ICD-10-CM | POA: Diagnosis not present

## 2018-10-15 NOTE — Progress Notes (Signed)
:  [facility] Room Number: 205D Place of Service:  SNF (31)  Madeline Parrish. [name], MD  Patient Care Team: Via, Caryn Bee, MD as PCP - General [facility])  Extended Emergency Contact Information Primary Emergency Contact: McNeely,Richard Address: [address]          [place], [place] [address] [place] Home Phone: [phone] Relation: Brother     Allergies: Patient has no known allergies.  Chief Complaint  Patient presents with  . Acute Visit    HPI: Patient is 85 y.o. female who is being seen today for a care plan meeting with her brother.  In attendance are Denny Peon, MDS, Salena from social work and myself and patient's brother by phone conference.  Past Medical History:  Diagnosis Date  . Acne rosacea   . CKD (chronic kidney disease), stage III (HCC)   . Depression   . Hypertension   . Osteopenia   . PUD (peptic ulcer disease)   . Restrictive lung disease   . Shingles   . Tinnitus    and vertigo    History reviewed. No pertinent surgical history.  Allergies as of 10/15/2018   No Known Allergies     Medication List       Accurate as of October 15, 2018  3:27 PM. Always use your most recent med list.        Centrum Silver 50+Women Tabs Take 1 tablet by mouth daily.   furosemide 20 MG tablet Commonly known as:  LASIX Take 20 mg by mouth every other day.   metoprolol succinate 25 MG 24 hr tablet Commonly known as:  TOPROL-XL Take 25 mg by mouth daily.   mirtazapine 7.5 MG tablet Commonly known as:  REMERON Take 7.5 mg by mouth. TAKE 1 TABLET BY MOUTH AT BEDTIME FOR APPETITE STIMULANT   polyethylene glycol 17 g packet Commonly known as:  MIRALAX / GLYCOLAX Take 17 g by mouth daily.   potassium chloride 10 MEQ tablet Commonly known as:  K-DUR Take 10 mEq by mouth every other day.       No orders of the defined types were placed in this encounter.   Immunization History  Administered  Date(s) Administered  . Influenza, High Dose Seasonal PF 03/31/2017    Social History   Tobacco Use  . Smoking status: Never Smoker  . Smokeless tobacco: Never Used  Substance Use Topics  . Alcohol use: No    Family history is   Family History  Problem Relation Age of Onset  . CVA Mother       Review of Systems  DATA OBTAINED: from nurse GENERAL:  no fevers, fatigue, appetite changes SKIN: No itching, or rash EYES: No eye pain, redness, discharge EARS: No earache, tinnitus, change in hearing NOSE: No congestion, drainage or bleeding  MOUTH/THROAT: No mouth or tooth pain, No sore throat RESPIRATORY: No cough, wheezing, SOB CARDIAC: No chest pain, palpitations, lower extremity edema  GI: No abdominal pain, No N/V/D or constipation, No heartburn or reflux  GU: No dysuria, frequency or urgency, or incontinence  MUSCULOSKELETAL: No unrelieved bone/joint pain NEUROLOGIC: No headache, dizziness or focal weakness PSYCHIATRIC: No c/o anxiety or sadness   Vitals:   10/15/18 1524  BP: 116/68  Pulse: (!) 56  Resp: 18  Temp: 98 F (36.7 C)    SpO2 Readings from Last 1 Encounters:  02/02/18 97%   Body mass index is 18.61 kg/m.  Physical Exam  GENERAL APPEARANCE: Alert, conversant,  No acute distress.  SKIN: No diaphoresis rash HEAD: Normocephalic, atraumatic  EYES: Conjunctiva/lids clear. Pupils round, reactive. EOMs intact.  EARS: External exam WNL, canals clear. Hearing grossly normal.  NOSE: No deformity or discharge.  MOUTH/THROAT: Lips w/o lesions  RESPIRATORY: Breathing is even, unlabored. Lung sounds are clear   CARDIOVASCULAR: Heart RRR no murmurs, rubs or gallops. No peripheral edema.   GASTROINTESTINAL: Abdomen is soft, non-tender, not distended w/ normal bowel sounds. GENITOURINARY: Bladder non tender, not distended  MUSCULOSKELETAL: No abnormal joints or musculature NEUROLOGIC:  Cranial nerves 2-12 grossly intact. Moves all extremities   PSYCHIATRIC: Mood and affect appropriate to situation with dementia, no behavioral issues  Patient Active Problem List   Diagnosis Date Noted  . Failure to thrive in adult 10/05/2018  . Chronic constipation 10/05/2018  . Cellulitis of left lower extremity 07/07/2018  . Pneumonia of lower lobe of lung (HCC) 02/09/2018  . Delirium 02/09/2018  . Chronic diastolic (congestive) heart failure (HCC) 02/09/2018  . Hematoma of left lower extremity 02/09/2018  . Acute on chronic diastolic CHF (congestive heart failure) (HCC) 01/30/2018  . Sepsis due to pneumonia (HCC) 01/30/2018  . Agitation 01/30/2018  . Right leg injury, subsequent encounter 01/30/2018  . Benign hypertension with chronic kidney disease, stage III (HCC) 10/06/2017  . Chronic kidney disease, stage III (moderate) (HCC) 10/06/2017  . Restrictive airway disease 10/06/2017  . Fall 09/29/2017  . Acute renal failure superimposed on stage 3 chronic kidney disease (HCC) 09/29/2017  . Bilateral lower extremity edema 08/15/2015  . Paroxysmal supraventricular tachycardia (HCC) 12/06/2013  . Essential hypertension, benign 12/06/2013  . Syncope and collapse 12/06/2013      Labs reviewed: Basic Metabolic Panel:    Component Value Date/Time   NA 145 02/02/2018 0627   NA 147 10/17/2017   K 3.1 (L) 02/02/2018 0627   CL 109 02/02/2018 0627   CO2 27 02/02/2018 0627   GLUCOSE 95 02/02/2018 0627   BUN 11 02/02/2018 0627   BUN 22 (A) 10/17/2017   CREATININE 0.94 02/02/2018 0627   CREATININE 1.07 (H) 08/15/2015 1422   CALCIUM 8.6 (L) 02/02/2018 0627   PROT 5.1 (L) 01/30/2018 1724   ALBUMIN 3.0 (L) 01/30/2018 1724   AST 43 (H) 01/30/2018 1724   ALT 15 01/30/2018 1724   ALKPHOS 55 01/30/2018 1724   BILITOT 1.8 (H) 01/30/2018 1724   GFRNONAA 50 (L) 02/02/2018 0627   GFRAA 58 (L) 02/02/2018 0627    Recent Labs    01/30/18 1724 02/01/18 0846 02/02/18 0627  NA 142 143 145  K 5.6* 3.2* 3.1*  CL 109 109 109  CO2 18* 25 27   GLUCOSE 115* 99 95  BUN 29* 16 11  CREATININE 1.84* 0.96 0.94  CALCIUM 8.4* 8.6* 8.6*   Liver Function Tests: Recent Labs    01/29/18 0952 01/30/18 1724  AST 17 43*  ALT 12 15  ALKPHOS 51 55  BILITOT 0.8 1.8*  PROT 5.6* 5.1*  ALBUMIN 3.0* 3.0*   No results for input(s): LIPASE, AMYLASE in the last 8760 hours. No results for input(s): AMMONIA in the last 8760 hours. CBC: Recent Labs    01/29/18 0952 01/30/18 2009 02/01/18 0846 02/02/18 0627  WBC 10.3 11.1* 8.3 8.4  NEUTROABS 8.6* 8.0*  --   --   HGB 13.5 12.0 12.8 12.3  HCT 41.3 38.3 39.4 37.4  MCV 95.6 98.5 95.4 93.5  PLT 242 241 230 268   Lipid No  results for input(s): CHOL, HDL, LDLCALC, TRIG in the last 8760 hours.  Cardiac Enzymes: Recent Labs    01/29/18 0952 01/30/18 1724  CKTOTAL  --  176  TROPONINI <0.03  --    BNP: No results for input(s): BNP in the last 8760 hours. No results found for: MICROALBUR No results found for: HGBA1C Lab Results  Component Value Date   TSH 4.553 (H) 01/30/2018   No results found for: VITAMINB12 No results found for: FOLATE No results found for: IRON, TIBC, FERRITIN  Imaging and Procedures obtained prior to SNF admission: Dg Chest Port 1 View  Result Date: 01/30/2018 CLINICAL DATA:  85 year old found lethargic and unresponsive at the nursing home. Hypotensive upon EMS arrival (60/30). EXAM: PORTABLE CHEST 1 VIEW COMPARISON:  01/29/2018, 09/29/2017. FINDINGS: Cardiac silhouette moderately to markedly enlarged. Thoracic aorta tortuous and atherosclerotic. Interval development of mild diffuse interstitial pulmonary edema. More focal patchy airspace opacities at the LEFT lung base. No focal airspace opacities elsewhere. IMPRESSION: 1. Stable moderate to marked cardiomegaly. Mild diffuse interstitial pulmonary edema indicates mild CHF and/or fluid overload. 2. Focal airspace opacities at the LEFT lung base, query superimposed pneumonia. Electronically Signed   By: Hulan Saas M.D.   On: 01/30/2018 18:04     Not all labs, radiology exams or other studies done during hospitalization come through on my EPIC note; however they are reviewed by me.    Assessment and Plan  Chronic systolic congestive heart failure, hypertension/PSVT/CKD 3- after all questions were asked and answered per [place] Custard and [place]; I discussed in detail with the nursing home was doing to protect his sister and although the residents; we also clarified with the brother was not Ms. Jordan Likes desires would be and per him she does not desire hospitalization she does not desire to be intubated she does not desire to leave the nursing home; if she were to get COVID-19 we would treat her with comfort care    Thurston Hole D. [name], MD

## 2018-10-17 ENCOUNTER — Encounter: Payer: Self-pay | Admitting: Internal Medicine

## 2018-10-17 DIAGNOSIS — R278 Other lack of coordination: Secondary | ICD-10-CM | POA: Diagnosis not present

## 2018-10-17 DIAGNOSIS — F0390 Unspecified dementia without behavioral disturbance: Secondary | ICD-10-CM | POA: Diagnosis not present

## 2018-10-17 DIAGNOSIS — N183 Chronic kidney disease, stage 3 (moderate): Secondary | ICD-10-CM | POA: Diagnosis not present

## 2018-10-17 DIAGNOSIS — R1312 Dysphagia, oropharyngeal phase: Secondary | ICD-10-CM | POA: Diagnosis not present

## 2018-10-19 DIAGNOSIS — F0390 Unspecified dementia without behavioral disturbance: Secondary | ICD-10-CM | POA: Diagnosis not present

## 2018-10-19 DIAGNOSIS — R278 Other lack of coordination: Secondary | ICD-10-CM | POA: Diagnosis not present

## 2018-10-19 DIAGNOSIS — N183 Chronic kidney disease, stage 3 (moderate): Secondary | ICD-10-CM | POA: Diagnosis not present

## 2018-10-19 DIAGNOSIS — R1312 Dysphagia, oropharyngeal phase: Secondary | ICD-10-CM | POA: Diagnosis not present

## 2018-10-20 DIAGNOSIS — R278 Other lack of coordination: Secondary | ICD-10-CM | POA: Diagnosis not present

## 2018-10-20 DIAGNOSIS — N183 Chronic kidney disease, stage 3 (moderate): Secondary | ICD-10-CM | POA: Diagnosis not present

## 2018-10-20 DIAGNOSIS — R1312 Dysphagia, oropharyngeal phase: Secondary | ICD-10-CM | POA: Diagnosis not present

## 2018-10-20 DIAGNOSIS — F0390 Unspecified dementia without behavioral disturbance: Secondary | ICD-10-CM | POA: Diagnosis not present

## 2018-10-21 DIAGNOSIS — F0390 Unspecified dementia without behavioral disturbance: Secondary | ICD-10-CM | POA: Diagnosis not present

## 2018-10-21 DIAGNOSIS — N183 Chronic kidney disease, stage 3 (moderate): Secondary | ICD-10-CM | POA: Diagnosis not present

## 2018-10-21 DIAGNOSIS — R1312 Dysphagia, oropharyngeal phase: Secondary | ICD-10-CM | POA: Diagnosis not present

## 2018-10-21 DIAGNOSIS — R278 Other lack of coordination: Secondary | ICD-10-CM | POA: Diagnosis not present

## 2018-10-22 DIAGNOSIS — N183 Chronic kidney disease, stage 3 (moderate): Secondary | ICD-10-CM | POA: Diagnosis not present

## 2018-10-22 DIAGNOSIS — R278 Other lack of coordination: Secondary | ICD-10-CM | POA: Diagnosis not present

## 2018-10-22 DIAGNOSIS — R1312 Dysphagia, oropharyngeal phase: Secondary | ICD-10-CM | POA: Diagnosis not present

## 2018-10-22 DIAGNOSIS — F0390 Unspecified dementia without behavioral disturbance: Secondary | ICD-10-CM | POA: Diagnosis not present

## 2018-10-23 DIAGNOSIS — R1312 Dysphagia, oropharyngeal phase: Secondary | ICD-10-CM | POA: Diagnosis not present

## 2018-10-23 DIAGNOSIS — N183 Chronic kidney disease, stage 3 (moderate): Secondary | ICD-10-CM | POA: Diagnosis not present

## 2018-10-23 DIAGNOSIS — R278 Other lack of coordination: Secondary | ICD-10-CM | POA: Diagnosis not present

## 2018-10-23 DIAGNOSIS — F0390 Unspecified dementia without behavioral disturbance: Secondary | ICD-10-CM | POA: Diagnosis not present

## 2018-10-30 ENCOUNTER — Encounter: Payer: Self-pay | Admitting: Internal Medicine

## 2018-10-30 ENCOUNTER — Non-Acute Institutional Stay (SKILLED_NURSING_FACILITY): Payer: Medicare Other | Admitting: Internal Medicine

## 2018-10-30 DIAGNOSIS — N183 Chronic kidney disease, stage 3 unspecified: Secondary | ICD-10-CM

## 2018-10-30 DIAGNOSIS — R627 Adult failure to thrive: Secondary | ICD-10-CM

## 2018-10-30 DIAGNOSIS — I129 Hypertensive chronic kidney disease with stage 1 through stage 4 chronic kidney disease, or unspecified chronic kidney disease: Secondary | ICD-10-CM

## 2018-10-30 DIAGNOSIS — I5032 Chronic diastolic (congestive) heart failure: Secondary | ICD-10-CM

## 2018-10-30 NOTE — Progress Notes (Signed)
[facility] Room Number: 205D Place of Service:  SNF [phone])  Randon Goldsmithnne D. Lyn HollingsheadAlexander, MD  Patient Care Team: Via, Caryn BeeKevin, MD as PCP - General [facility](Family Medicine)  Extended Emergency Contact Information Primary Emergency Contact: McNeely,Richard Address: [address]          [place], [place] [address] [place] Home Phone: [phone] Relation: Brother    Allergies: Patient has no known allergies.  Chief Complaint  Patient presents with  . Medical Management of Chronic Issues    Routine visit    HPI: Patient is 85 y.o. female who is being seen for routine issues of hypertension, chronic diastolic congestive heart failure, and failure to thrive.  Past Medical History:  Diagnosis Date  . Acne rosacea   . CKD (chronic kidney disease), stage III (HCC)   . Depression   . Hypertension   . Osteopenia   . PUD (peptic ulcer disease)   . Restrictive lung disease   . Shingles   . Tinnitus    and vertigo    History reviewed. No pertinent surgical history.  Allergies as of 10/30/2018   No Known Allergies     Medication List       Accurate as of Oct 30, 2018 11:59 PM. Always use your most recent med list.        Centrum Silver 50+Women Tabs Take 1 tablet by mouth daily.   furosemide 20 MG tablet Commonly known as:  LASIX Take 20 mg by mouth every other day.   metoprolol succinate 25 MG 24 hr tablet Commonly known as:  TOPROL-XL Take 25 mg by mouth daily.   mirtazapine 7.5 MG tablet Commonly known as:  REMERON Take 7.5 mg by mouth. TAKE 1 TABLET BY MOUTH AT BEDTIME FOR APPETITE STIMULANT   polyethylene glycol 17 g packet Commonly known as:  MIRALAX / GLYCOLAX Take 17 g by mouth daily.   potassium chloride 10 MEQ tablet Commonly known as:  K-DUR Take 10 mEq by mouth every other day.       No orders of the defined types were placed in this encounter.   Immunization History  Administered Date(s)  Administered  . Influenza, High Dose Seasonal PF 03/31/2017    Social History   Tobacco Use  . Smoking status: Never Smoker  . Smokeless tobacco: Never Used  Substance Use Topics  . Alcohol use: No    Review of Systems  DATA OBTAINED: from patient-limited; nursing-no acute concerns GENERAL:  no fevers, fatigue, appetite changes SKIN: No itching, rash HEENT: No complaint RESPIRATORY: No cough, wheezing, SOB CARDIAC: No chest pain, palpitations, lower extremity edema  GI: No abdominal pain, No N/V/D or constipation, No heartburn or reflux  GU: No dysuria, frequency or urgency, or incontinence  MUSCULOSKELETAL: No unrelieved bone/joint pain NEUROLOGIC: No headache, dizziness  PSYCHIATRIC: No overt anxiety or sadness  Vitals:   10/30/18 1411  BP: 113/68  Pulse: 84  Resp: 20  Temp: 98 F (36.7 C)   Body mass index is 18.57 kg/m. Physical Exam  GENERAL APPEARANCE: Alert, conversant, No acute distress  SKIN: No diaphoresis rash HEENT: Unremarkable RESPIRATORY: Breathing is even, unlabored. Lung sounds are clear   CARDIOVASCULAR: Heart RRR no murmurs, rubs or gallops. No peripheral edema  GASTROINTESTINAL: Abdomen is soft, non-tender, not distended w/ normal bowel sounds.  GENITOURINARY: Bladder non tender, not distended  MUSCULOSKELETAL: No abnormal joints or musculature NEUROLOGIC: Cranial nerves 2-12 grossly intact. Moves all  extremities PSYCHIATRIC: Mood and affect dementia, no behavioral issues  Patient Active Problem List   Diagnosis Date Noted  . Failure to thrive in adult 10/05/2018  . Chronic constipation 10/05/2018  . Cellulitis of left lower extremity 07/07/2018  . Pneumonia of lower lobe of lung (HCC) 02/09/2018  . Delirium 02/09/2018  . Chronic diastolic (congestive) heart failure (HCC) 02/09/2018  . Hematoma of left lower extremity 02/09/2018  . Acute on chronic diastolic CHF (congestive heart failure) (HCC) 01/30/2018  . Sepsis due to pneumonia (HCC)  01/30/2018  . Agitation 01/30/2018  . Right leg injury, subsequent encounter 01/30/2018  . Benign hypertension with chronic kidney disease, stage III (HCC) 10/06/2017  . Chronic kidney disease, stage III (moderate) (HCC) 10/06/2017  . Restrictive airway disease 10/06/2017  . Fall 09/29/2017  . Acute renal failure superimposed on stage 3 chronic kidney disease (HCC) 09/29/2017  . Bilateral lower extremity edema 08/15/2015  . Paroxysmal supraventricular tachycardia (HCC) 12/06/2013  . Essential hypertension, benign 12/06/2013  . Syncope and collapse 12/06/2013    CMP     Component Value Date/Time   NA 145 02/02/2018 0627   NA 147 10/17/2017   K 3.1 (L) 02/02/2018 0627   CL 109 02/02/2018 0627   CO2 27 02/02/2018 0627   GLUCOSE 95 02/02/2018 0627   BUN 11 02/02/2018 0627   BUN 22 (A) 10/17/2017   CREATININE 0.94 02/02/2018 0627   CREATININE 1.07 (H) 08/15/2015 1422   CALCIUM 8.6 (L) 02/02/2018 0627   PROT 5.1 (L) 01/30/2018 1724   ALBUMIN 3.0 (L) 01/30/2018 1724   AST 43 (H) 01/30/2018 1724   ALT 15 01/30/2018 1724   ALKPHOS 55 01/30/2018 1724   BILITOT 1.8 (H) 01/30/2018 1724   GFRNONAA 50 (L) 02/02/2018 0627   GFRAA 58 (L) 02/02/2018 0627   Recent Labs    01/30/18 1724 02/01/18 0846 02/02/18 0627  NA 142 143 145  K 5.6* 3.2* 3.1*  CL 109 109 109  CO2 18* 25 27  GLUCOSE 115* 99 95  BUN 29* 16 11  CREATININE 1.84* 0.96 0.94  CALCIUM 8.4* 8.6* 8.6*   Recent Labs    01/29/18 0952 01/30/18 1724  AST 17 43*  ALT 12 15  ALKPHOS 51 55  BILITOT 0.8 1.8*  PROT 5.6* 5.1*  ALBUMIN 3.0* 3.0*   Recent Labs    01/29/18 0952 01/30/18 2009 02/01/18 0846 02/02/18 0627  WBC 10.3 11.1* 8.3 8.4  NEUTROABS 8.6* 8.0*  --   --   HGB 13.5 12.0 12.8 12.3  HCT 41.3 38.3 39.4 37.4  MCV 95.6 98.5 95.4 93.5  PLT 242 241 230 268   No results for input(s): CHOL, LDLCALC, TRIG in the last 8760 hours.  Invalid input(s): HCL No results found for: [facility] Lab Results   Component Value Date   TSH 4.553 (H) 01/30/2018   No results found for: HGBA1C Lab Results  Component Value Date   CHOL 167 08/15/2015   HDL 65 08/15/2015   LDLCALC 87 08/15/2015   TRIG 76 08/15/2015   CHOLHDL 2.6 08/15/2015    Significant Diagnostic Results in last 30 days:  No results found.  Assessment and Plan  Benign hypertension with chronic kidney disease, stage III (HCC) Good control; continue Toprol-XL 25 mg daily, Lasix every other day and Cozaar 25 mg daily  Chronic diastolic (congestive) heart failure (HCC) No reported exacerbations; continue Cozaar 25 mg daily, Lasix 20 mg every other day and Toprol-XL 25 mg daily  Failure to thrive  in adult Patient at the moment seems to be doing well, is alert and active in the hallway; continue supportive care     [name]. [name], MD

## 2018-10-31 ENCOUNTER — Encounter: Payer: Self-pay | Admitting: Internal Medicine

## 2018-10-31 NOTE — Assessment & Plan Note (Signed)
No reported exacerbations; continue Cozaar 25 mg daily, Lasix 20 mg every other day and Toprol-XL 25 mg daily

## 2018-10-31 NOTE — Assessment & Plan Note (Signed)
Patient at the moment seems to be doing well, is alert and active in the hallway; continue supportive care

## 2018-10-31 NOTE — Assessment & Plan Note (Signed)
Good control; continue Toprol-XL 25 mg daily, Lasix every other day and Cozaar 25 mg daily

## 2018-11-30 ENCOUNTER — Encounter: Payer: Self-pay | Admitting: Internal Medicine

## 2018-11-30 ENCOUNTER — Non-Acute Institutional Stay (SKILLED_NURSING_FACILITY): Payer: Medicare Other | Admitting: Internal Medicine

## 2018-11-30 DIAGNOSIS — R600 Localized edema: Secondary | ICD-10-CM

## 2018-11-30 DIAGNOSIS — J984 Other disorders of lung: Secondary | ICD-10-CM

## 2018-11-30 DIAGNOSIS — I471 Supraventricular tachycardia: Secondary | ICD-10-CM | POA: Diagnosis not present

## 2018-11-30 NOTE — Progress Notes (Signed)
Location[facility] Room Number: 205/D Place of Service:  SNF (31)  Margit Hanks, MD  Patient Care Team: Margit Hanks, MD as PCP - General (Internal Medicine)  Extended Emergency Contact Information Primary Emergency Contact: McNeely,Richard Address: [address]          [place], [place] [address] [place] Home Phone: [phone] Relation: Brother    Allergies: Patient has no known allergies.  Chief Complaint  Patient presents with  . Medical Management of Chronic Issues    Routine visit of medical management  . Best Practice Recommendations    Dexa Scan    HPI: Patient is 85 y.o. female who is being seen for routine issues of paroxysmal atrial fib, reactive airway disease, and bilateral lower extremity edema.  Past Medical History:  Diagnosis Date  . Acne rosacea   . CKD (chronic kidney disease), stage III (HCC)   . Depression   . Hypertension   . Osteopenia   . PUD (peptic ulcer disease)   . Restrictive lung disease   . Shingles   . Tinnitus    and vertigo    History reviewed. No pertinent surgical history.  Allergies as of 11/30/2018   No Known Allergies     Medication List       Accurate as of November 30, 2018 11:59 PM. If you have any questions, ask your nurse or doctor.        Centrum Silver 50+Women Tabs Take 1 tablet by mouth daily.   clotrimazole-betamethasone cream Commonly known as:  LOTRISONE APPLY CREAM TOPICALLY TO RASH/REDNESS ON LOWER LEFT EXTREMITIES ONCE DAILY   furosemide 20 MG tablet Commonly known as:  LASIX Take 20 mg by mouth every other day.   metoprolol succinate 25 MG 24 hr tablet Commonly known as:  TOPROL-XL Take 25 mg by mouth daily.   mirtazapine 7.5 MG tablet Commonly known as:  REMERON Take 7.5 mg by mouth. TAKE 1 TABLET BY MOUTH AT BEDTIME FOR APPETITE STIMULANT   NON FORMULARY INITIATE 120 ML PO MEDPASS TID AS SUPPLEMENT   polyethylene glycol 17 g  packet Commonly known as:  MIRALAX / GLYCOLAX Take 17 g by mouth daily.   potassium chloride 10 MEQ tablet Commonly known as:  K-DUR Take 10 mEq by mouth every other day.       No orders of the defined types were placed in this encounter.   Immunization History  Administered Date(s) Administered  . Influenza, High Dose Seasonal PF 03/31/2017    Social History   Tobacco Use  . Smoking status: Never Smoker  . Smokeless tobacco: Never Used  Substance Use Topics  . Alcohol use: No    Review of Systems  DATA OBTAINED: from patient, nurse GENERAL:  no fevers, fatigue, appetite changes SKIN: No itching, rash HEENT: No complaint RESPIRATORY: No cough, wheezing, SOB CARDIAC: No chest pain, palpitations, lower extremity edema  GI: No abdominal pain, No N/V/D or constipation, No heartburn or reflux  GU: No dysuria, frequency or urgency, or incontinence  MUSCULOSKELETAL: No unrelieved bone/joint pain NEUROLOGIC: No headache, dizziness  PSYCHIATRIC: No overt anxiety or sadness  Vitals:   11/30/18 1046  BP: 134/78  Pulse: 64  Resp: 16  Temp: 97.6 F (36.4 C)   Body mass index is 18.47 kg/m. Physical Exam  GENERAL APPEARANCE: Alert, conversant, No acute distress  SKIN: No diaphoresis rash HEENT: Unremarkable RESPIRATORY: Breathing is even, unlabored. Lung sounds are clear  CARDIOVASCULAR: Heart RRR no murmurs, rubs or gallops. No peripheral edema  GASTROINTESTINAL: Abdomen is soft, non-tender, not distended w/ normal bowel sounds.  GENITOURINARY: Bladder non tender, not distended  MUSCULOSKELETAL: No abnormal joints or musculature NEUROLOGIC: Cranial nerves 2-12 grossly intact. Moves all extremities PSYCHIATRIC: Mood and affect appropriate [place], no behavioral issues  Patient Active Problem List   Diagnosis Date Noted  . Failure to thrive in adult 10/05/2018  . Chronic constipation 10/05/2018  . Cellulitis of left lower extremity 07/07/2018  . Pneumonia of  lower lobe of lung (HCC) 02/09/2018  . Delirium 02/09/2018  . Chronic diastolic (congestive) heart failure (HCC) 02/09/2018  . Hematoma of left lower extremity 02/09/2018  . Acute on chronic diastolic CHF (congestive heart failure) (HCC) 01/30/2018  . Sepsis due to pneumonia (HCC) 01/30/2018  . Agitation 01/30/2018  . Right leg injury, subsequent encounter 01/30/2018  . Benign hypertension with chronic kidney disease, stage III (HCC) 10/06/2017  . Chronic kidney disease, stage III (moderate) (HCC) 10/06/2017  . Restrictive airway disease 10/06/2017  . Fall 09/29/2017  . Acute renal failure superimposed on stage 3 chronic kidney disease (HCC) 09/29/2017  . Bilateral lower extremity edema 08/15/2015  . Paroxysmal supraventricular tachycardia (HCC) 12/06/2013  . Essential hypertension, benign 12/06/2013  . Syncope and collapse 12/06/2013    CMP     Component Value Date/Time   NA 145 02/02/2018 0627   NA 147 10/17/2017   K 3.1 (L) 02/02/2018 0627   CL 109 02/02/2018 0627   CO2 27 02/02/2018 0627   GLUCOSE 95 02/02/2018 0627   BUN 11 02/02/2018 0627   BUN 22 (A) 10/17/2017   CREATININE 0.94 02/02/2018 0627   CREATININE 1.07 (H) 08/15/2015 1422   CALCIUM 8.6 (L) 02/02/2018 0627   PROT 5.1 (L) 01/30/2018 1724   ALBUMIN 3.0 (L) 01/30/2018 1724   AST 43 (H) 01/30/2018 1724   ALT 15 01/30/2018 1724   ALKPHOS 55 01/30/2018 1724   BILITOT 1.8 (H) 01/30/2018 1724   GFRNONAA 50 (L) 02/02/2018 0627   GFRAA 58 (L) 02/02/2018 0627   Recent Labs    01/30/18 1724 02/01/18 0846 02/02/18 0627  NA 142 143 145  K 5.6* 3.2* 3.1*  CL 109 109 109  CO2 18* 25 27  GLUCOSE 115* 99 95  BUN 29* 16 11  CREATININE 1.84* 0.96 0.94  CALCIUM 8.4* 8.6* 8.6*   Recent Labs    01/29/18 0952 01/30/18 1724  AST 17 43*  ALT 12 15  ALKPHOS 51 55  BILITOT 0.8 1.8*  PROT 5.6* 5.1*  ALBUMIN 3.0* 3.0*   Recent Labs    01/29/18 0952 01/30/18 2009 02/01/18 0846 02/02/18 0627  WBC 10.3 11.1*  8.3 8.4  NEUTROABS 8.6* 8.0*  --   --   HGB 13.5 12.0 12.8 12.3  HCT 41.3 38.3 39.4 37.4  MCV 95.6 98.5 95.4 93.5  PLT 242 241 230 268   No results for input(s): CHOL, LDLCALC, TRIG in the last 8760 hours.  Invalid input(s): HCL No results found for: [facility] Lab Results  Component Value Date   TSH 4.553 (H) 01/30/2018   No results found for: HGBA1C Lab Results  Component Value Date   CHOL 167 08/15/2015   HDL 65 08/15/2015   LDLCALC 87 08/15/2015   TRIG 76 08/15/2015   CHOLHDL 2.6 08/15/2015    Significant Diagnostic Results in last 30 days:  No results found.  Assessment and Plan  Paroxysmal supraventricular tachycardia (HCC) Rate controlled; continue Toprol-XL 25  mg daily; patient is not on anticoagulant is not on antiplatelets secondary to age  Restrictive airway disease Stable without any reported use of inhalers or nebs; continue supportive care  Bilateral lower extremity edema Baseline with minimal edema; continue Lasix 20 mg daily     Joeangel Jeanpaul D. Shiheem Corporan MD.

## 2018-12-02 ENCOUNTER — Encounter: Payer: Self-pay | Admitting: Internal Medicine

## 2018-12-02 DIAGNOSIS — R41841 Cognitive communication deficit: Secondary | ICD-10-CM | POA: Diagnosis not present

## 2018-12-02 NOTE — Assessment & Plan Note (Signed)
Rate controlled; continue Toprol-XL 25 mg daily; patient is not on anticoagulant is not on antiplatelets secondary to age

## 2018-12-02 NOTE — Assessment & Plan Note (Signed)
Baseline with minimal edema; continue Lasix 20 mg daily

## 2018-12-02 NOTE — Assessment & Plan Note (Signed)
Stable without any reported use of inhalers or nebs; continue supportive care

## 2018-12-03 DIAGNOSIS — E559 Vitamin D deficiency, unspecified: Secondary | ICD-10-CM | POA: Diagnosis not present

## 2018-12-03 DIAGNOSIS — D649 Anemia, unspecified: Secondary | ICD-10-CM | POA: Diagnosis not present

## 2018-12-03 DIAGNOSIS — I10 Essential (primary) hypertension: Secondary | ICD-10-CM | POA: Diagnosis not present

## 2018-12-03 DIAGNOSIS — E039 Hypothyroidism, unspecified: Secondary | ICD-10-CM | POA: Diagnosis not present

## 2018-12-03 LAB — VITAMIN D 25 HYDROXY (VIT D DEFICIENCY, FRACTURES): Vit D, 25-Hydroxy: 20.77

## 2018-12-15 DIAGNOSIS — I10 Essential (primary) hypertension: Secondary | ICD-10-CM | POA: Diagnosis not present

## 2018-12-15 DIAGNOSIS — D649 Anemia, unspecified: Secondary | ICD-10-CM | POA: Diagnosis not present

## 2018-12-15 LAB — BASIC METABOLIC PANEL
BUN: 22 — AB (ref 4–21)
Creatinine: 1.1 (ref 0.5–1.1)
Glucose: 85
Potassium: 3.9 (ref 3.4–5.3)
Sodium: 146 (ref 137–147)

## 2019-01-04 ENCOUNTER — Encounter: Payer: Self-pay | Admitting: Internal Medicine

## 2019-01-04 ENCOUNTER — Non-Acute Institutional Stay (SKILLED_NURSING_FACILITY): Payer: Medicare Other | Admitting: Internal Medicine

## 2019-01-04 DIAGNOSIS — N183 Chronic kidney disease, stage 3 unspecified: Secondary | ICD-10-CM

## 2019-01-04 DIAGNOSIS — I5032 Chronic diastolic (congestive) heart failure: Secondary | ICD-10-CM

## 2019-01-04 DIAGNOSIS — R627 Adult failure to thrive: Secondary | ICD-10-CM

## 2019-01-04 DIAGNOSIS — I129 Hypertensive chronic kidney disease with stage 1 through stage 4 chronic kidney disease, or unspecified chronic kidney disease: Secondary | ICD-10-CM | POA: Diagnosis not present

## 2019-01-04 NOTE — Progress Notes (Signed)
[facility] Room Number: 205-D Place of Service:  SNF (31)  Margit HanksAlexander, Ashaad Gaertner D, MD  Patient Care Team: Margit HanksAlexander, Derik Fults D, MD as PCP - General (Internal Medicine)  Extended Emergency Contact Information Primary Emergency Contact: McNeely,Richard Address: [address]          [place], [place] [address] [place] Home Phone: [phone] Relation: Brother    Allergies: Patient has no known allergies.  Chief Complaint  Patient presents with  . Medical Management of Chronic Issues    Routine [facility] visit    HPI: Patient is a [age] y.o. female who is being seen for routine issues of failure to thrive, chronic diastolic congestive heart failure, and hypertension.  Past Medical History:  Diagnosis Date  . Acne rosacea   . CKD (chronic kidney disease), stage III (HCC)   . Depression   . Hypertension   . Osteopenia   . PUD (peptic ulcer disease)   . Restrictive lung disease   . Shingles   . Tinnitus    and vertigo    History reviewed. No pertinent surgical history.  Allergies as of 01/04/2019   No Known Allergies     Medication List       Accurate as of January 04, 2019 11:59 PM. If you have any questions, ask your nurse or doctor.        STOP taking these medications   mirtazapine 7.5 MG tablet Commonly known as: REMERON Stopped by: Merrilee SeashoreAnne Odile Veloso, MD   NON FORMULARY Stopped by: Merrilee SeashoreAnne Roemello Speyer, MD     TAKE these medications   Centrum Silver 50+Women Tabs Take 1 tablet by mouth daily.   clotrimazole-betamethasone cream Commonly known as: LOTRISONE APPLY CREAM TOPICALLY TO RASH/REDNESS ON LOWER LEFT EXTREMITIES ONCE DAILY   furosemide 20 MG tablet Commonly known as: LASIX Take 20 mg by mouth every other day.   metoprolol succinate 25 MG 24 hr tablet Commonly known as: TOPROL-XL Take 25 mg by mouth daily.   Nutritional Supplement Liqd Take 120 mLs by mouth 3 (three) times daily. MedPass    polyethylene glycol 17 g packet Commonly known as: MIRALAX / GLYCOLAX Take 17 g by mouth daily.   potassium chloride 10 MEQ tablet Commonly known as: K-DUR Take 10 mEq by mouth every other day.   Vitamin D3 1.25 MG (50000 UT) Caps Take 1 capsule by mouth once a week.       No orders of the defined types were placed in this encounter.   Immunization History  Administered Date(s) Administered  . Influenza, High Dose Seasonal PF 03/31/2017  . Influenza-Unspecified 03/17/2018    Social History   Tobacco Use  . Smoking status: Never Smoker  . Smokeless tobacco: Never Used  Substance Use Topics  . Alcohol use: No    Review of Systems  DATA OBTAINED: from patient is limited; nursing-no acute concerns GENERAL:  no fevers, fatigue, appetite changes SKIN: No itching, rash HEENT: No complaint RESPIRATORY: No cough, wheezing, SOB CARDIAC: No chest pain, palpitations, lower extremity edema  GI: No abdominal pain, No N/V/D or constipation, No heartburn or reflux  GU: No dysuria, frequency or urgency, or incontinence  MUSCULOSKELETAL: No unrelieved bone/joint pain NEUROLOGIC: No headache, dizziness  PSYCHIATRIC: No overt anxiety or sadness  Vitals:   01/04/19 0810  BP: 116/69  Pulse: 63  Resp: 17  Temp: (!) 97.5 F (36.4 C)   Body mass index is 18.13 kg/m. Physical  Exam  GENERAL APPEARANCE: Alert, No acute distress  SKIN: No diaphoresis rash HEENT: Unremarkable RESPIRATORY: Breathing is even, unlabored. Lung sounds are clear   CARDIOVASCULAR: Heart RRR no murmurs, rubs or gallops. No peripheral edema  GASTROINTESTINAL: Abdomen is soft, non-tender, not distended w/ normal bowel sounds.  GENITOURINARY: Bladder non tender, not distended  MUSCULOSKELETAL: No abnormal joints or musculature NEUROLOGIC: Cranial nerves 2-12 grossly intact. Moves all extremities PSYCHIATRIC: Mood and affect with dementia, no behavioral issues  Patient Active Problem List   Diagnosis  Date Noted  . Failure to thrive in adult 10/05/2018  . Chronic constipation 10/05/2018  . Cellulitis of left lower extremity 07/07/2018  . Pneumonia of lower lobe of lung ([place]) 02/09/2018  . Delirium 02/09/2018  . Chronic diastolic (congestive) heart failure ([place]) 02/09/2018  . Hematoma of left lower extremity 02/09/2018  . Acute on chronic diastolic CHF (congestive heart failure) ([place]) 01/30/2018  . Sepsis due to pneumonia ([place]) 01/30/2018  . Agitation 01/30/2018  . Right leg injury, subsequent encounter 01/30/2018  . Benign hypertension with chronic kidney disease, stage III ([place]) 10/06/2017  . Chronic kidney disease, stage III (moderate) ([place]) 10/06/2017  . Restrictive airway disease 10/06/2017  . Fall 09/29/2017  . Acute renal failure superimposed on stage 3 chronic kidney disease ([place]) 09/29/2017  . Bilateral lower extremity edema 08/15/2015  . Paroxysmal supraventricular tachycardia ([place]) 12/06/2013  . Essential hypertension, benign 12/06/2013  . Syncope and collapse 12/06/2013    CMP     Component Value Date/Time   NA 145 02/02/2018 0627   NA 147 10/17/2017   K 3.1 (L) 02/02/2018 0627   CL 109 02/02/2018 0627   CO2 27 02/02/2018 0627   GLUCOSE 95 02/02/2018 0627   BUN 11 02/02/2018 0627   BUN 22 (A) 10/17/2017   CREATININE 0.94 02/02/2018 0627   CREATININE 1.07 (H) 08/15/2015 1422   CALCIUM 8.6 (L) 02/02/2018 0627   PROT 5.1 (L) 01/30/2018 1724   ALBUMIN 3.0 (L) 01/30/2018 1724   AST 43 (H) 01/30/2018 1724   ALT 15 01/30/2018 1724   ALKPHOS 55 01/30/2018 1724   BILITOT 1.8 (H) 01/30/2018 1724   GFRNONAA 50 (L) 02/02/2018 0627   GFRAA 58 (L) 02/02/2018 0627   Recent Labs    01/30/18 1724 02/01/18 0846 02/02/18 0627  NA 142 143 145  K 5.6* 3.2* 3.1*  CL 109 109 109  CO2 18* 25 27  GLUCOSE 115* 99 95  BUN 29* 16 11  CREATININE 1.84* 0.96 0.94  CALCIUM 8.4* 8.6* 8.6*   Recent Labs    01/29/18 0952 01/30/18 1724  AST 17 43*  ALT 12 15  ALKPHOS 51 55   BILITOT 0.8 1.8*  PROT 5.6* 5.1*  ALBUMIN 3.0* 3.0*   Recent Labs    01/29/18 0952 01/30/18 2009 02/01/18 0846 02/02/18 0627  WBC 10.3 11.1* 8.3 8.4  NEUTROABS 8.6* 8.0*  --   --   HGB 13.5 12.0 12.8 12.3  HCT 41.3 38.3 39.4 37.4  MCV 95.6 98.5 95.4 93.5  PLT 242 241 230 268   No results for input(s): CHOL, LDLCALC, TRIG in the last 8760 hours.  Invalid input(s): HCL No results found for: [facility] Lab Results  Component Value Date   TSH 4.553 (H) 01/30/2018   No results found for: HGBA1C Lab Results  Component Value Date   CHOL 167 08/15/2015   HDL 65 08/15/2015   LDLCALC 87 08/15/2015   TRIG 76 08/15/2015   CHOLHDL 2.6 08/15/2015  Significant Diagnostic Results in last 30 days:  No results found.  Assessment and Plan  Failure to thrive in adult Patient continues to not be failing to thrive, and is in the hall almost daily even during COVID, smiles wheels her self around; continue supportive care  Chronic diastolic (congestive) heart failure (HCC) Continues without exacerbation; continue Toprol-XL 25 mg daily, Cozaar 25 mg daily, and Lasix 20 mg every other day  Benign hypertension with chronic kidney disease, stage III (HCC) Well-controlled; continue Cozaar 25 mg daily, Toprol-XL 25 mg daily and Lasix 20 mg every other day    Margit HanksAnne D Vail Basista, MD

## 2019-01-06 ENCOUNTER — Encounter: Payer: Self-pay | Admitting: Internal Medicine

## 2019-01-06 NOTE — Assessment & Plan Note (Signed)
Continues without exacerbation; continue Toprol-XL 25 mg daily, Cozaar 25 mg daily, and Lasix 20 mg every other day

## 2019-01-06 NOTE — Assessment & Plan Note (Signed)
Patient continues to not be failing to thrive, and is in the hall almost daily even during COVID, smiles wheels her self around; continue supportive care

## 2019-01-06 NOTE — Assessment & Plan Note (Signed)
Well-controlled; continue Cozaar 25 mg daily, Toprol-XL 25 mg daily and Lasix 20 mg every other day

## 2019-01-13 ENCOUNTER — Non-Acute Institutional Stay (SKILLED_NURSING_FACILITY): Payer: Medicare Other | Admitting: Internal Medicine

## 2019-01-13 ENCOUNTER — Encounter: Payer: Self-pay | Admitting: Internal Medicine

## 2019-01-13 DIAGNOSIS — J1282 Pneumonia due to coronavirus disease 2019: Secondary | ICD-10-CM

## 2019-01-13 DIAGNOSIS — R05 Cough: Secondary | ICD-10-CM | POA: Diagnosis not present

## 2019-01-13 DIAGNOSIS — R319 Hematuria, unspecified: Secondary | ICD-10-CM | POA: Diagnosis not present

## 2019-01-13 DIAGNOSIS — N390 Urinary tract infection, site not specified: Secondary | ICD-10-CM | POA: Diagnosis not present

## 2019-01-13 DIAGNOSIS — J069 Acute upper respiratory infection, unspecified: Secondary | ICD-10-CM | POA: Diagnosis not present

## 2019-01-13 DIAGNOSIS — R509 Fever, unspecified: Secondary | ICD-10-CM | POA: Diagnosis not present

## 2019-01-13 DIAGNOSIS — Z79899 Other long term (current) drug therapy: Secondary | ICD-10-CM | POA: Diagnosis not present

## 2019-01-13 DIAGNOSIS — R9389 Abnormal findings on diagnostic imaging of other specified body structures: Secondary | ICD-10-CM | POA: Diagnosis not present

## 2019-01-13 DIAGNOSIS — U071 COVID-19: Secondary | ICD-10-CM

## 2019-01-13 DIAGNOSIS — D649 Anemia, unspecified: Secondary | ICD-10-CM | POA: Diagnosis not present

## 2019-01-13 HISTORY — DX: COVID-19: U07.1

## 2019-01-13 LAB — CBC AND DIFFERENTIAL
HCT: 45 (ref 36–46)
Hemoglobin: 15.1 (ref 12.0–16.0)
Neutrophils Absolute: 5
Platelets: 129 — AB (ref 150–399)
WBC: 6.9

## 2019-01-13 NOTE — Progress Notes (Signed)
[facility] Room Number: [id number] Place of Service:  SNF [phone] Provider:  Edmon CrapeLassen, Pollie Poma, PA-C  Margit HanksAlexander, Anne D, MD  Patient Care Team: Margit HanksAlexander, Anne D, MD as PCP - General (Internal Medicine)  Extended Emergency Contact Information Primary Emergency Contact: McNeely,Richard Address: [address]          [place], [place] [address] [place] Home Phone: [phone] Relation: Brother  Code Status:  DNR Goals of care: Advanced Directive information Advanced Directives 01/04/2019  Does Patient Have a Medical Advance Directive? Yes  Type of Advance Directive Out of facility DNR (pink MOST or yellow form)  Does patient want to make changes to medical advance directive? No - Patient declined  Pre-existing out of facility DNR order (yellow form or pink MOST form) Yellow form placed in chart (order not valid for inpatient use)     Chief Complaint  Patient presents with  . Acute Visit    Followup on pneumonia.    HPI:  Pt is a 85 y.o. female seen today for an acute visit follow-up of a chest x-ray which shows bilateral pneumonia.  Patient is a long-term resident of facility a history of failure to thrive as well as chronic diastolic CHF on Lasix 3 times a week as well as hypertension atrial fibrillation reactive airway disease and a history of lower extremity edema  Apparently last night she developed a temperature of 102.6 somewhat lethargic with oxygen saturations 88 to 90%.  She was started on IV normal saline at 75 cc an hour- checks x-ray also was ordered during the course of her treatment  She has been moved to the isolation unit.  Checks x-ray has come back showing bilateral pneumonia.  She is currently afebrile and per staff she appears to be stable.  She does appear to be somewhat of a poor historian but when I asked her she does not really have any complaints not really noted any signs of respiratory compromise.    Past Medical History:  Diagnosis Date  . Acne rosacea   . CKD (chronic kidney disease), stage III (HCC)   . Depression   . Hypertension   . Osteopenia   . PUD (peptic ulcer disease)   . Restrictive lung disease   . Shingles   . Tinnitus    and vertigo   History reviewed. No pertinent surgical history.  No Known Allergies  Outpatient Encounter Medications as of 01/13/2019  Medication Sig  . Ascorbic Acid (VITAMIN C) 1000 MG tablet Take 1,000 mg by mouth daily.  Marland Kitchen. aspirin 81 MG chewable tablet Chew 81 mg by mouth daily.  . AZITHROMYCIN IV Inject 500 mg into the vein daily.  . cetaphil (CETAPHIL) lotion Apply 1 application topically at bedtime. Apply to bilateral lower extremities  . Cholecalciferol (VITAMIN D3) 1.25 MG (50000 UT) CAPS Take 1 capsule by mouth once a week.  . clotrimazole-betamethasone (LOTRISONE) cream APPLY CREAM TOPICALLY TO RASH/REDNESS ON LOWER LEFT EXTREMITIES ONCE DAILY  . furosemide (LASIX) 20 MG tablet Take 20 mg by mouth every other day.   . metoprolol succinate (TOPROL-XL) 25 MG 24 hr tablet Take 25 mg by mouth daily.   . Multiple Vitamins-Minerals (CENTRUM SILVER 50+WOMEN) TABS Take 1 tablet by mouth daily.  . Nutritional Supplement LIQD Take 120 mLs by mouth 3 (three) times daily. MedPass  . polyethylene glycol (MIRALAX / GLYCOLAX) packet Take 17 g by mouth daily.  . potassium chloride (K-DUR,KLOR-CON) 10 MEQ  tablet Take 10 mEq by mouth every other day.   . sodium chloride 0.9 % infusion Inject 1,000 mLs into the vein once. 75 mL/hour x2 liters = 2000 mL  . Zinc Sulfate 220 (50 Zn) MG TABS Take 1 tablet by mouth.   No facility-administered encounter medications on file as of 01/13/2019.     Review of Systems   This is quite limited from the patient provided by nursing as well.  In general she is afebrile today.  Skin there is been no diaphoresis noted to my knowledge.  Head ears eyes nose mouth and throat she is not complaining of a sore throat.   Respiratory does not really complain of shortness of breath or significant coughing.  Cardiac does not complain of chest pain has some mild suspect baseline lower extremity edema.  GI poor appetite although apparently this is not new she is not really complaining of any abdominal pain no nausea or vomiting noted by nursing staff.  GU she does have a urinalysis and culture pending because of the fever but appear to complain of dysuria.  Musculoskeletal does have weakness but does not really complain of pain today.  Neurologic does not complain dizzy or having a headache she does have weakness.  Psych she appears to have some cognitive deficits-she is somewhat restless but not in any distress  Immunization History  Administered Date(s) Administered  . Influenza, High Dose Seasonal PF 03/31/2017  . Influenza-Unspecified 03/17/2018   Pertinent  Health Maintenance Due  Topic Date Due  . INFLUENZA VACCINE  01/30/2019  . DEXA SCAN  Discontinued  . PNA vac Low Risk Adult  Discontinued   No flowsheet data found. Functional Status Survey:    Vitals:   01/13/19 1542  BP: 102/66  Pulse: 67  Resp: 18  Temp: 98.9 F (37.2 C)  TempSrc: Oral  Weight: 102 lb 9.6 oz (46.5 kg)  Height: 5\' 4"  (1.626 m)   Body mass index is 17.61 kg/m. Physical Exam In general this is a somewhat frail elderly female in no distress lying in bed appeared to be sleeping but was easily arousable and responsive.  Skin is warm and dry she does have IV lower left arm with wrapping.  Eyes visual acuity appears to be intact.  Oropharynx is clear mucous membranes appear a little dry.  Chest poor respiratory effort but could not really appreciate congestion or labored breathing.  Heart could not s fully assess because patient is moving about but from what I could ascertain and per radial pulse was largely regular rate and rhythm in the   She has mild--trace  lower extremity edema.  Abdomen is soft does  not appear to be tender there are positive bowel sounds.  Musculoskeletal she did not really follow verbal commands does hold her lower extremities in somewhat of a contracted position is able to move her upper extremities appears to be relatively baseline. In fact-- Her arm movement somewhat made it difficult to auscultate her heart sounds   Neurologic she is alert could not really appreciate lateralizing findings she did speak but not much.  Psych she appears to have some cognitive issues with dementia she is alert and responsive    Labs reviewed:  January 13, 2019.  Sodium 146 potassium 4.2 BUN 29.9 creatinine 1.13.  WBC 6.9 hemoglobin 15.1 platelets 120   Recent Labs    01/30/18 1724 02/01/18 0846 02/02/18 0627  NA 142 143 145  K 5.6* 3.2* 3.1*  CL 109  109 109  CO2 18* 25 27  GLUCOSE 115* 99 95  BUN 29* 16 11  CREATININE 1.84* 0.96 0.94  CALCIUM 8.4* 8.6* 8.6*   Recent Labs    01/29/18 0952 01/30/18 1724  AST 17 43*  ALT 12 15  ALKPHOS 51 55  BILITOT 0.8 1.8*  PROT 5.6* 5.1*  ALBUMIN 3.0* 3.0*   Recent Labs    01/29/18 0952 01/30/18 2009 02/01/18 0846 02/02/18 0627  WBC 10.3 11.1* 8.3 8.4  NEUTROABS 8.6* 8.0*  --   --   HGB 13.5 12.0 12.8 12.3  HCT 41.3 38.3 39.4 37.4  MCV 95.6 98.5 95.4 93.5  PLT 242 241 230 268   Lab Results  Component Value Date   TSH 4.553 (H) 01/30/2018   No results found for: HGBA1C Lab Results  Component Value Date   CHOL 167 08/15/2015   HDL 65 08/15/2015   LDLCALC 87 08/15/2015   TRIG 76 08/15/2015   CHOLHDL 2.6 08/15/2015    Significant Diagnostic Results in last 30 days:  No results found.  Assessment/Plan  #1 history of fever with bilateral pneumonia- fevers most likely respiratory related- COVID-19 test has been ordered she is on the isolation unit.  We will start her on dual therapy with azithromycin 500 mg either IV or p.o. x5 days daily.  Also Rocephin 1 g either IM or IV for 5 days.  This was  discussed with Dr. Lyn HollingsheadAlexander via phone.  Also she is receiving IV her sodium is slightly high we will change her IV fluids to half-normal saline for a liter update a BMP tomorrow morning.  Also will hold her Lasix until further notice because of her somewhat rising sodium which would indicate she is getting a bit dry.  Clinically she does not appear to be in any distress but will continue to be monitored- will await updated pending labs*--including COVID-19 as well as urine culture  [id number]- of note greater than 35 minutes spent assessing patient- discussing her status with nursing staff as well as with Dr. Lyn HollingsheadAlexander via phone and coordinating and formulating plan of care- of note greater than 50% of time spent coordinating a plan of care with input as noted above

## 2019-01-14 DIAGNOSIS — I10 Essential (primary) hypertension: Secondary | ICD-10-CM | POA: Diagnosis not present

## 2019-01-14 DIAGNOSIS — D649 Anemia, unspecified: Secondary | ICD-10-CM | POA: Diagnosis not present

## 2019-01-14 LAB — BASIC METABOLIC PANEL
BUN: 19 (ref 4–21)
Creatinine: 0.9 (ref 0.5–1.1)
Glucose: 79
Potassium: 4 (ref 3.4–5.3)
Sodium: 140 (ref 137–147)

## 2019-01-15 ENCOUNTER — Non-Acute Institutional Stay (SKILLED_NURSING_FACILITY): Payer: Medicare Other | Admitting: Internal Medicine

## 2019-01-15 DIAGNOSIS — Z20822 Contact with and (suspected) exposure to covid-19: Secondary | ICD-10-CM

## 2019-01-15 DIAGNOSIS — N390 Urinary tract infection, site not specified: Secondary | ICD-10-CM

## 2019-01-15 DIAGNOSIS — R6889 Other general symptoms and signs: Secondary | ICD-10-CM | POA: Diagnosis not present

## 2019-01-15 DIAGNOSIS — J189 Pneumonia, unspecified organism: Secondary | ICD-10-CM

## 2019-01-15 DIAGNOSIS — J181 Lobar pneumonia, unspecified organism: Secondary | ICD-10-CM

## 2019-01-15 DIAGNOSIS — B964 Proteus (mirabilis) (morganii) as the cause of diseases classified elsewhere: Secondary | ICD-10-CM

## 2019-01-17 ENCOUNTER — Encounter: Payer: Self-pay | Admitting: Internal Medicine

## 2019-01-17 DIAGNOSIS — B964 Proteus (mirabilis) (morganii) as the cause of diseases classified elsewhere: Secondary | ICD-10-CM | POA: Insufficient documentation

## 2019-01-17 DIAGNOSIS — N390 Urinary tract infection, site not specified: Secondary | ICD-10-CM | POA: Insufficient documentation

## 2019-01-17 NOTE — Progress Notes (Signed)
Location:  [facility] of Service:   SNF  Hennie Duos, MD  Patient Care Team: Hennie Duos, MD as PCP - General (Internal Medicine)  Extended Emergency Contact Information Primary Emergency Contact: McNeely,Richard Address: [address]          [place], [place] [address] [place] Phone: [phone] Relation: Brother    Allergies: Patient has no known allergies.  Chief Complaint  Patient presents with  . Acute Visit    HPI: Patient is 85 y.o. female who is being seen in follow-up for pneumonia and for a new Proteus UTI.  Patient had already been placed on Rocephin and Zithromax and has had 2 L of IV fluid, the second liter being half-normal saline for a slightly elevated sodium of 146.  Per nursing patient looks much much better today than she did yesterday.  O2 saturation is 93% room air.  Past Medical History:  Diagnosis Date  . Acne rosacea   . CKD (chronic kidney disease), stage III ([place])   . Depression   . Hypertension   . Osteopenia   . PUD (peptic ulcer disease)   . Restrictive lung disease   . Shingles   . Tinnitus    and vertigo    History reviewed. No pertinent surgical history.  Allergies as of 01/15/2019   No Known Allergies     Medication List       Accurate as of January 15, 2019 11:59 PM. If you have any questions, ask your nurse or doctor.        aspirin 81 MG chewable tablet Chew 81 mg by mouth daily.   AZITHROMYCIN IV Inject 500 mg into the vein daily.   Centrum Silver 50+Women Tabs Take 1 tablet by mouth daily.   cetaphil lotion Apply 1 application topically at bedtime. Apply to bilateral lower extremities   clotrimazole-betamethasone cream Commonly known as: LOTRISONE APPLY CREAM TOPICALLY TO RASH/REDNESS ON LOWER LEFT EXTREMITIES ONCE DAILY   furosemide 20 MG tablet Commonly known as: LASIX Take 20 mg by mouth every other day.   metoprolol succinate 25 MG 24  hr tablet Commonly known as: TOPROL-XL Take 25 mg by mouth daily.   Nutritional Supplement Liqd Take 120 mLs by mouth 3 (three) times daily. MedPass   polyethylene glycol 17 g packet Commonly known as: MIRALAX / GLYCOLAX Take 17 g by mouth daily.   potassium chloride 10 MEQ tablet Commonly known as: K-DUR Take 10 mEq by mouth every other day.   sodium chloride 0.9 % infusion Inject 1,000 mLs into the vein once. 75 mL/hour x2 liters = 2000 mL   vitamin C 1000 MG tablet Take 1,000 mg by mouth daily.   Vitamin D3 1.25 MG (50000 UT) Caps Take 1 capsule by mouth once a week.   Zinc Sulfate 220 (50 Zn) MG Tabs Take 1 tablet by mouth.       No orders of the defined types were placed in this encounter.   Immunization History  Administered Date(s) Administered  . Influenza, High Dose Seasonal PF 03/31/2017  . Influenza-Unspecified 03/17/2018    Social History   Tobacco Use  . Smoking status: Never Smoker  . Smokeless tobacco: Never Used  Substance Use Topics  . Alcohol use: No    Review of Systems  DATA OBTAINED: from patient, nurse GENERAL:  no fevers SKIN: No itching, rash HEENT: No complaint RESPIRATORY: + cough, no wheezing, SOB CARDIAC:  No chest pain, palpitations, lower extremity edema  GI: No abdominal pain, No N/V/D or constipation, No heartburn or reflux  GU: No dysuria, frequency or urgency, or incontinence  MUSCULOSKELETAL: No unrelieved bone/joint pain NEUROLOGIC: No headache, dizziness  PSYCHIATRIC: No overt anxiety or sadness  Vitals:   01/17/19 1830  BP: 98/74  Pulse: 74  Resp: 18  Temp: 98 F (36.7 C)  SpO2: 93%   There is no height or weight on file to calculate BMI. Physical Exam  GENERAL APPEARANCE: Alert, minimally conversant, looks sick but not toxic SKIN: No diaphoresis rash HEENT: Unremarkable RESPIRATORY: Breathing is even, unlabored. Lung sounds are decreased, no wheezes no rails CARDIOVASCULAR: Heart RRR no murmurs, rubs  or gallops. No peripheral edema  GASTROINTESTINAL: Abdomen is soft, non-tender, not distended w/ normal bowel sounds.  GENITOURINARY: Bladder non tender, not distended  MUSCULOSKELETAL: No abnormal joints or musculature NEUROLOGIC: Cranial nerves 2-12 grossly intact. Moves all extremities PSYCHIATRIC: Mood and affect with dementia, no behavioral issues  Patient Active Problem List   Diagnosis Date Noted  . Failure to thrive in adult 10/05/2018  . Chronic constipation 10/05/2018  . Cellulitis of left lower extremity 07/07/2018  . Pneumonia of lower lobe of lung (HCC) 02/09/2018  . Delirium 02/09/2018  . Chronic diastolic (congestive) heart failure (HCC) 02/09/2018  . Hematoma of left lower extremity 02/09/2018  . Acute on chronic diastolic CHF (congestive heart failure) (HCC) 01/30/2018  . Sepsis due to pneumonia (HCC) 01/30/2018  . Agitation 01/30/2018  . Right leg injury, subsequent encounter 01/30/2018  . Benign hypertension with chronic kidney disease, stage III (HCC) 10/06/2017  . Chronic kidney disease, stage III (moderate) (HCC) 10/06/2017  . Restrictive airway disease 10/06/2017  . Fall 09/29/2017  . Acute renal failure superimposed on stage 3 chronic kidney disease (HCC) 09/29/2017  . Bilateral lower extremity edema 08/15/2015  . Paroxysmal supraventricular tachycardia (HCC) 12/06/2013  . Essential hypertension, benign 12/06/2013  . Syncope and collapse 12/06/2013    CMP     Component Value Date/Time   NA 145 02/02/2018 0627   NA 147 10/17/2017   K 3.1 (L) 02/02/2018 0627   CL 109 02/02/2018 0627   CO2 27 02/02/2018 0627   GLUCOSE 95 02/02/2018 0627   BUN 11 02/02/2018 0627   BUN 22 (A) 10/17/2017   CREATININE 0.94 02/02/2018 0627   CREATININE 1.07 (H) 08/15/2015 1422   CALCIUM 8.6 (L) 02/02/2018 0627   PROT 5.1 (L) 01/30/2018 1724   ALBUMIN 3.0 (L) 01/30/2018 1724   AST 43 (H) 01/30/2018 1724   ALT 15 01/30/2018 1724   ALKPHOS 55 01/30/2018 1724   BILITOT  1.8 (H) 01/30/2018 1724   GFRNONAA 50 (L) 02/02/2018 0627   GFRAA 58 (L) 02/02/2018 0627   Recent Labs    01/30/18 1724 02/01/18 0846 02/02/18 0627  NA 142 143 145  K 5.6* 3.2* 3.1*  CL 109 109 109  CO2 18* 25 27  GLUCOSE 115* 99 95  BUN 29* 16 11  CREATININE 1.84* 0.96 0.94  CALCIUM 8.4* 8.6* 8.6*   Recent Labs    01/29/18 0952 01/30/18 1724  AST 17 43*  ALT 12 15  ALKPHOS 51 55  BILITOT 0.8 1.8*  PROT 5.6* 5.1*  ALBUMIN 3.0* 3.0*   Recent Labs    01/29/18 0952 01/30/18 2009 02/01/18 0846 02/02/18 0627  WBC 10.3 11.1* 8.3 8.4  NEUTROABS 8.6* 8.0*  --   --   HGB 13.5 12.0 12.8 12.3  HCT 41.3 38.3  39.4 37.4  MCV 95.6 98.5 95.4 93.5  PLT 242 241 230 268   No results for input(s): CHOL, LDLCALC, TRIG in the last 8760 hours.  Invalid input(s): HCL No results found for: [facility] Lab Results  Component Value Date   TSH 4.553 (H) 01/30/2018   No results found for: HGBA1C Lab Results  Component Value Date   CHOL 167 08/15/2015   HDL 65 08/15/2015   LDLCALC 87 08/15/2015   TRIG 76 08/15/2015   CHOLHDL 2.6 08/15/2015    Significant Diagnostic Results in last 30 days:  No results found.  Assessment and Plan  Pneumonia/Proteus UTI/suspected coronavirus infection-patient was already being treated for pneumonia with Rocephin and Zithromax, the Zithromax IV as patient was not swallowing well at the time patient had 2 L of IV fluid 1 normal saline and one half normal saline for sodium of 146, patient's white count is 6.9; today when to start patient on Decadron 6 mg daily for 7 days; will continue to monitor  No problem-specific Assessment & Plan notes found for this encounter.   Labs/tests ordered:    Margit HanksAnne D Teruko Joswick, MD

## 2019-01-25 ENCOUNTER — Non-Acute Institutional Stay (SKILLED_NURSING_FACILITY): Payer: Medicare Other | Admitting: Internal Medicine

## 2019-01-25 ENCOUNTER — Encounter: Payer: Self-pay | Admitting: Internal Medicine

## 2019-01-25 DIAGNOSIS — B964 Proteus (mirabilis) (morganii) as the cause of diseases classified elsewhere: Secondary | ICD-10-CM | POA: Diagnosis not present

## 2019-01-25 DIAGNOSIS — N390 Urinary tract infection, site not specified: Secondary | ICD-10-CM

## 2019-01-25 DIAGNOSIS — U071 COVID-19: Secondary | ICD-10-CM

## 2019-01-25 DIAGNOSIS — J181 Lobar pneumonia, unspecified organism: Secondary | ICD-10-CM

## 2019-01-25 DIAGNOSIS — J189 Pneumonia, unspecified organism: Secondary | ICD-10-CM

## 2019-01-25 DIAGNOSIS — B999 Unspecified infectious disease: Secondary | ICD-10-CM | POA: Diagnosis not present

## 2019-01-25 NOTE — Progress Notes (Signed)
Location:  [facility] Room Number: 422-W Place of Service:  SNF (31)  Madeline Duos, MD  Patient Care Team: Madeline Duos, MD as PCP - General (Internal Medicine)  Extended Emergency Contact Information Primary Emergency Contact: Parrish,Madeline Address: [address]          [place], [place] [address] [place] Phone: [phone] Relation: Brother    Allergies: Patient has no known allergies.  Chief Complaint  Patient presents with  . Acute Visit    Patient is seen secondary to refusing all oral intake.     HPI: Patient is a 85 y.o. female who is in the COVID-19 isolation unit.  Patient has been treated for pneumonia with Rocephin and Zithromax, she also had a UTI with Proteus when she was found to be covered positive.  Patient was started on Decadron on 7/18 and she has been given IV fluid.  Today patient is refusing to eat or drink  Past Medical History:  Diagnosis Date  . Acne rosacea   . CKD (chronic kidney disease), stage III ([place])   . Depression   . Hypertension   . Osteopenia   . PUD (peptic ulcer disease)   . Restrictive lung disease   . Shingles   . Tinnitus    and vertigo    No past surgical history on file.  Allergies as of 01/25/2019   No Known Allergies     Medication List       Accurate as of January 25, 2019  4:41 PM. If you have any questions, ask your nurse or doctor.        STOP taking these medications   furosemide 20 MG tablet Commonly known as: LASIX Stopped by: Inocencio Homes, MD     TAKE these medications   aspirin 81 MG chewable tablet Chew 81 mg by mouth daily.   Centrum Silver 50+Women Tabs Take 1 tablet by mouth daily.   cetaphil lotion Apply 1 application topically at bedtime. Apply to bilateral lower extremities   clotrimazole-betamethasone cream Commonly known as: LOTRISONE APPLY CREAM TOPICALLY TO RASH/REDNESS ON LOWER LEFT EXTREMITIES ONCE DAILY    heparin lock flush 100 UNIT/ML Soln injection Inject 100 Units into the vein once. Flush midline with 3 mL after completion of medication.   metoprolol succinate 25 MG 24 hr tablet Commonly known as: TOPROL-XL Take 25 mg by mouth daily.   Nutritional Supplement Liqd Take 120 mLs by mouth 3 (three) times daily. MedPass   polyethylene glycol 17 g packet Commonly known as: MIRALAX / GLYCOLAX Take 17 g by mouth daily.   potassium chloride 10 MEQ tablet Commonly known as: K-DUR Take 10 mEq by mouth every other day.   sodium chloride 0.9 % infusion Inject 1,000 mLs into the vein continuous.   sodium chloride 0.9 % injection Inject 10 mLs into the vein as needed. Flush midline before and after IV medication   vitamin C 1000 MG tablet Take 1,000 mg by mouth daily.   Vitamin D3 1.25 MG (50000 UT) Caps Take 1 capsule by mouth once a week.   Zinc Sulfate 220 (50 Zn) MG Tabs Take 1 tablet by mouth.       No orders of the defined types were placed in this encounter.   Immunization History  Administered Date(s) Administered  . Influenza, High Dose Seasonal PF 03/31/2017  . Influenza-Unspecified 03/17/2018    Social History   Tobacco Use  . Smoking  status: Never Smoker  . Smokeless tobacco: Never Used  Substance Use Topics  . Alcohol use: No    Review of Systems   unable to obtain secondary to patient status; nursing-as per history of present illness    Vitals:   01/25/19 1621  BP: 100/77  Pulse: 88  Resp: 18  Temp: 98.2 F (36.8 C)  SpO2: 93%   Body mass index is 17.61 kg/m. Physical Exam  GENERAL APPEARANCE: Patient is somnolent, minimally responsive SKIN: No diaphoresis rash/pale HEENT: Unremarkable RESPIRATORY: Breathing is even, unlabored. Lung sounds are clear   CARDIOVASCULAR: Heart RRR no murmurs, rubs or gallops. No peripheral edema  GASTROINTESTINAL: Abdomen is soft, non-tender, not distended w/ normal bowel sounds.  GENITOURINARY: Bladder non  tender, not distended  MUSCULOSKELETAL: No abnormal joints or musculature NEUROLOGIC: Cranial nerves 2-12 grossly intact.;  Patient did not move during my visit PSYCHIATRIC: Flat, dementia, no behavioral issues  Patient Active Problem List   Diagnosis Date Noted  . Urinary tract infection due to Proteus 01/17/2019  . Failure to thrive in adult 10/05/2018  . Chronic constipation 10/05/2018  . Cellulitis of left lower extremity 07/07/2018  . Pneumonia of lower lobe of lung (HCC) 02/09/2018  . Delirium 02/09/2018  . Chronic diastolic (congestive) heart failure (HCC) 02/09/2018  . Hematoma of left lower extremity 02/09/2018  . Acute on chronic diastolic CHF (congestive heart failure) (HCC) 01/30/2018  . Sepsis due to pneumonia (HCC) 01/30/2018  . Agitation 01/30/2018  . Right leg injury, subsequent encounter 01/30/2018  . Benign hypertension with chronic kidney disease, stage III (HCC) 10/06/2017  . Chronic kidney disease, stage III (moderate) (HCC) 10/06/2017  . Restrictive airway disease 10/06/2017  . Fall 09/29/2017  . Acute renal failure superimposed on stage 3 chronic kidney disease (HCC) 09/29/2017  . Bilateral lower extremity edema 08/15/2015  . Paroxysmal supraventricular tachycardia (HCC) 12/06/2013  . Essential hypertension, benign 12/06/2013  . Syncope and collapse 12/06/2013    CMP     Component Value Date/Time   NA 145 02/02/2018 0627   NA 147 10/17/2017   K 3.1 (L) 02/02/2018 0627   CL 109 02/02/2018 0627   CO2 27 02/02/2018 0627   GLUCOSE 95 02/02/2018 0627   BUN 11 02/02/2018 0627   BUN 22 (A) 10/17/2017   CREATININE 0.94 02/02/2018 0627   CREATININE 1.07 (H) 08/15/2015 1422   CALCIUM 8.6 (L) 02/02/2018 0627   PROT 5.1 (L) 01/30/2018 1724   ALBUMIN 3.0 (L) 01/30/2018 1724   AST 43 (H) 01/30/2018 1724   ALT 15 01/30/2018 1724   ALKPHOS 55 01/30/2018 1724   BILITOT 1.8 (H) 01/30/2018 1724   GFRNONAA 50 (L) 02/02/2018 0627   GFRAA 58 (L) 02/02/2018 0627    Recent Labs    01/30/18 1724 02/01/18 0846 02/02/18 0627  NA 142 143 145  K 5.6* 3.2* 3.1*  CL 109 109 109  CO2 18* 25 27  GLUCOSE 115* 99 95  BUN 29* 16 11  CREATININE 1.84* 0.96 0.94  CALCIUM 8.4* 8.6* 8.6*   Recent Labs    01/29/18 0952 01/30/18 1724  AST 17 43*  ALT 12 15  ALKPHOS 51 55  BILITOT 0.8 1.8*  PROT 5.6* 5.1*  ALBUMIN 3.0* 3.0*   Recent Labs    01/29/18 0952 01/30/18 2009 02/01/18 0846 02/02/18 0627  WBC 10.3 11.1* 8.3 8.4  NEUTROABS 8.6* 8.0*  --   --   HGB 13.5 12.0 12.8 12.3  HCT 41.3 38.3 39.4 37.4  MCV  95.6 98.5 95.4 93.5  PLT 242 241 230 268   No results for input(s): CHOL, LDLCALC, TRIG in the last 8760 hours.  Invalid input(s): HCL No results found for: [facility] Lab Results  Component Value Date   TSH 4.553 (H) 01/30/2018   No results found for: HGBA1C Lab Results  Component Value Date   CHOL 167 08/15/2015   HDL 65 08/15/2015   LDLCALC 87 08/15/2015   TRIG 76 08/15/2015   CHOLHDL 2.6 08/15/2015    Significant Diagnostic Results in last 30 days:  No results found.  Assessment and Plan  Recent pneumonia/recent UTI/refusing p.o. intake/COVID-19- today patient looks like she may not make it, per myself and per the patient's nurse so I called her brother Mamie LaurelRichard Parrish and he is coming to visit with her; despite the fact that she is a DNR I think that we will start clysis on her.  We have had some success with clysis and this patient is normally out in the hall in her wheelchair on a daily basis so her baseline is is better than most patients with dementia; will continue O2 nasal cannula; will continue close monitoring     Margit HanksAnne D Dahna Hattabaugh, MD

## 2019-01-26 ENCOUNTER — Non-Acute Institutional Stay (SKILLED_NURSING_FACILITY): Payer: Medicare Other | Admitting: Internal Medicine

## 2019-01-26 DIAGNOSIS — B999 Unspecified infectious disease: Secondary | ICD-10-CM

## 2019-01-26 DIAGNOSIS — F0280 Dementia in other diseases classified elsewhere without behavioral disturbance: Secondary | ICD-10-CM

## 2019-01-26 DIAGNOSIS — G301 Alzheimer's disease with late onset: Secondary | ICD-10-CM

## 2019-01-26 DIAGNOSIS — U071 COVID-19: Secondary | ICD-10-CM | POA: Diagnosis not present

## 2019-01-29 ENCOUNTER — Non-Acute Institutional Stay (SKILLED_NURSING_FACILITY): Payer: Medicare Other | Admitting: Internal Medicine

## 2019-01-29 ENCOUNTER — Encounter: Payer: Self-pay | Admitting: Internal Medicine

## 2019-01-29 DIAGNOSIS — U071 COVID-19: Secondary | ICD-10-CM

## 2019-01-29 DIAGNOSIS — G301 Alzheimer's disease with late onset: Secondary | ICD-10-CM

## 2019-01-29 DIAGNOSIS — B999 Unspecified infectious disease: Secondary | ICD-10-CM | POA: Diagnosis not present

## 2019-01-29 DIAGNOSIS — F0280 Dementia in other diseases classified elsewhere without behavioral disturbance: Secondary | ICD-10-CM | POA: Diagnosis not present

## 2019-01-29 NOTE — Progress Notes (Signed)
[facility] Room Number: 422-W Place of Service:  SNF (31)  Margit HanksAlexander, Adithya Difrancesco D, MD  Patient Care Team: Margit HanksAlexander, Shawniece Oyola D, MD as PCP - General (Internal Medicine)  Extended Emergency Contact Information Primary Emergency Contact: McNeely,Richard Address: [address]          [place], [place] [address] [place] Home Phone: [phone] Relation: Brother    Allergies: Patient has no known allergies.  Chief Complaint  Patient presents with  . Acute Visit    Followup on COVID positivity    HPI: Patient is 85 y.o. female with dementia who is COVID-19 positive, general follow-up from several days ago today patient is responding much better, she is taking her meds she ate a little today she looks better, she is speak stronger she is on clysis.  No symptoms of fever cough shortness of breath.  Past Medical History:  Diagnosis Date  . Acne rosacea   . CKD (chronic kidney disease), stage III (HCC)   . Depression   . Hypertension   . Osteopenia   . PUD (peptic ulcer disease)   . Restrictive lung disease   . Shingles   . Tinnitus    and vertigo    History reviewed. No pertinent surgical history.  Allergies as of 01/29/2019   No Known Allergies     Medication List       Accurate as of January 29, 2019  3:50 PM. If you have any questions, ask your nurse or doctor.        STOP taking these medications   heparin lock flush 100 UNIT/ML Soln injection Stopped by: Merrilee SeashoreAnne Branson Kranz, MD     TAKE these medications   Centrum Silver 50+Women Tabs Take 1 tablet by mouth daily.   cetaphil lotion Apply 1 application topically at bedtime. Apply to bilateral lower extremities   clotrimazole-betamethasone cream Commonly known as: LOTRISONE Apply 1 application topically daily. APPLY  TO RASH/REDNESS ON LOWER LEFT EXTREMITIES   metoprolol succinate 25 MG 24 hr tablet Commonly known as: TOPROL-XL Take 25 mg by mouth daily.    Nutritional Supplement Liqd Take 120 mLs by mouth 3 (three) times daily. MedPass   polyethylene glycol 17 g packet Commonly known as: MIRALAX / GLYCOLAX Take 17 g by mouth daily.   potassium chloride 10 MEQ tablet Commonly known as: K-DUR Take 10 mEq by mouth every other day.   sodium chloride 0.9 % infusion Inject into the vein continuous. 60 mL/hour via clysis What changed: Another medication with the same name was removed. Continue taking this medication, and follow the directions you see here. Changed by: Merrilee SeashoreAnne Jeovani Weisenburger, MD   vitamin C 1000 MG tablet Take 1,000 mg by mouth daily.   Vitamin D3 1.25 MG (50000 UT) Caps Take 1 capsule by mouth once a week.       No orders of the defined types were placed in this encounter.   Immunization History  Administered Date(s) Administered  . Influenza, High Dose Seasonal PF 03/31/2017  . Influenza-Unspecified 03/17/2018    Social History   Tobacco Use  . Smoking status: Never Smoker  . Smokeless tobacco: Never Used  Substance Use Topics  . Alcohol use: No    Review of Systems      unable to obtain secondary to dementia; nursing- as per history of present illness patient is looking better today    Vitals:   01/29/19 1529  BP: 109/60  Pulse: 79  Resp: 17  Temp: (!) 97.1 F (36.2 C)  SpO2: 93%   Body mass index is 17.61 kg/m. Physical Exam  GENERAL APPEARANCE: Alert, stronger speech today, No acute distress  SKIN: No diaphoresis rash; skin color looks better HEENT: Unremarkable RESPIRATORY: Breathing is even, unlabored. Lung sounds are clear   CARDIOVASCULAR: Heart RRR no murmurs, rubs or gallops. No peripheral edema  GASTROINTESTINAL: Abdomen is soft, non-tender, not distended w/ normal bowel sounds.  GENITOURINARY: Bladder non tender, not distended  MUSCULOSKELETAL: No abnormal joints or musculature NEUROLOGIC: Cranial nerves 2-12 grossly intact. Moves all extremities PSYCHIATRIC: Dementia, no behavioral  issues  Patient Active Problem List   Diagnosis Date Noted  . Urinary tract infection due to Proteus 01/17/2019  . Failure to thrive in adult 10/05/2018  . Chronic constipation 10/05/2018  . Cellulitis of left lower extremity 07/07/2018  . Pneumonia of lower lobe of lung ([place]) 02/09/2018  . Delirium 02/09/2018  . Chronic diastolic (congestive) heart failure ([place]) 02/09/2018  . Hematoma of left lower extremity 02/09/2018  . Acute on chronic diastolic CHF (congestive heart failure) ([place]) 01/30/2018  . Sepsis due to pneumonia ([place]) 01/30/2018  . Agitation 01/30/2018  . Right leg injury, subsequent encounter 01/30/2018  . Benign hypertension with chronic kidney disease, stage III ([place]) 10/06/2017  . Chronic kidney disease, stage III (moderate) ([place]) 10/06/2017  . Restrictive airway disease 10/06/2017  . Fall 09/29/2017  . Acute renal failure superimposed on stage 3 chronic kidney disease ([place]) 09/29/2017  . Bilateral lower extremity edema 08/15/2015  . Paroxysmal supraventricular tachycardia ([place]) 12/06/2013  . Essential hypertension, benign 12/06/2013  . Syncope and collapse 12/06/2013    CMP     Component Value Date/Time   NA 145 02/02/2018 0627   NA 147 10/17/2017   K 3.1 (L) 02/02/2018 0627   CL 109 02/02/2018 0627   CO2 27 02/02/2018 0627   GLUCOSE 95 02/02/2018 0627   BUN 11 02/02/2018 0627   BUN 22 (A) 10/17/2017   CREATININE 0.94 02/02/2018 0627   CREATININE 1.07 (H) 08/15/2015 1422   CALCIUM 8.6 (L) 02/02/2018 0627   PROT 5.1 (L) 01/30/2018 1724   ALBUMIN 3.0 (L) 01/30/2018 1724   AST 43 (H) 01/30/2018 1724   ALT 15 01/30/2018 1724   ALKPHOS 55 01/30/2018 1724   BILITOT 1.8 (H) 01/30/2018 1724   GFRNONAA 50 (L) 02/02/2018 0627   GFRAA 58 (L) 02/02/2018 0627   Recent Labs    01/30/18 1724 02/01/18 0846 02/02/18 0627  NA 142 143 145  K 5.6* 3.2* 3.1*  CL 109 109 109  CO2 18* 25 27  GLUCOSE 115* 99 95  BUN 29* 16 11  CREATININE 1.84* 0.96 0.94  CALCIUM  8.4* 8.6* 8.6*   Recent Labs    01/30/18 1724  AST 43*  ALT 15  ALKPHOS 55  BILITOT 1.8*  PROT 5.1*  ALBUMIN 3.0*   Recent Labs    01/30/18 2009 02/01/18 0846 02/02/18 0627  WBC 11.1* 8.3 8.4  NEUTROABS 8.0*  --   --   HGB 12.0 12.8 12.3  HCT 38.3 39.4 37.4  MCV 98.5 95.4 93.5  PLT 241 230 268   No results for input(s): CHOL, LDLCALC, TRIG in the last 8760 hours.  Invalid input(s): HCL No results found for: [facility] Lab Results  Component Value Date   TSH 4.553 (H) 01/30/2018   No results found for: HGBA1C Lab Results  Component Value Date   CHOL 167 08/15/2015   HDL 65 08/15/2015  LDLCALC 87 08/15/2015   TRIG 76 08/15/2015   CHOLHDL 2.6 08/15/2015    Significant Diagnostic Results in last 30 days:  No results found.  Assessment and Plan  Dementia/COVID-19 positive- patient is looking better stronger, taking meds, ate a little today plan to continue clysis until she is eating on her own; from a code point you patient is symptom-free at the moment; will continue to monitor closely    Margit HanksAnne D Novelle Addair, MD

## 2019-01-31 ENCOUNTER — Encounter: Payer: Self-pay | Admitting: Internal Medicine

## 2019-01-31 NOTE — Progress Notes (Signed)
[facility]   Place of Service:   SNF  Lyn HollingsheadAlexander, Randon GoldsmithAnne D, MD  Patient Care Team: Margit HanksAlexander, Anne D, MD as PCP - General (Internal Medicine)  Extended Emergency Contact Information Primary Emergency Contact: McNeely,Richard Address: [address]          [place], [place] [address] [place] Home Phone: [phone] Relation: Brother    Allergies: Patient has no known allergies.  Chief Complaint  Patient presents with  . Acute Visit    HPI: Patient is 85 y.o. female who who is in the COVID-19 isolation unit who I am following up on yesterday.  Clysis is been started.  Today the patient leans forward, spoke to me although she was confused she looked better from yesterday  Past Medical History:  Diagnosis Date  . Acne rosacea   . CKD (chronic kidney disease), stage III (HCC)   . Depression   . Hypertension   . Osteopenia   . PUD (peptic ulcer disease)   . Restrictive lung disease   . Shingles   . Tinnitus    and vertigo    No past surgical history on file.  Allergies as of 01/26/2019   No Known Allergies     Medication List       Accurate as of January 26, 2019 11:59 PM. If you have any questions, ask your nurse or doctor.        aspirin 81 MG chewable tablet Chew 81 mg by mouth daily.   Centrum Silver 50+Women Tabs Take 1 tablet by mouth daily.   cetaphil lotion Apply 1 application topically at bedtime. Apply to bilateral lower extremities   clotrimazole-betamethasone cream Commonly known as: LOTRISONE Apply 1 application topically daily. APPLY  TO RASH/REDNESS ON LOWER LEFT EXTREMITIES   heparin lock flush 100 UNIT/ML Soln injection Inject 100 Units into the vein once. Flush midline with 3 mL after completion of medication.   metoprolol succinate 25 MG 24 hr tablet Commonly known as: TOPROL-XL Take 25 mg by mouth daily.   Nutritional Supplement Liqd Take 120 mLs by mouth 3 (three) times daily. MedPass   polyethylene  glycol 17 g packet Commonly known as: MIRALAX / GLYCOLAX Take 17 g by mouth daily.   potassium chloride 10 MEQ tablet Commonly known as: K-DUR Take 10 mEq by mouth every other day.   sodium chloride 0.9 % infusion Inject into the vein continuous. 60 mL/hour via clysis   sodium chloride 0.9 % injection Inject 10 mLs into the vein as needed. Flush midline before and after IV medication   vitamin C 1000 MG tablet Take 1,000 mg by mouth daily.   Vitamin D3 1.25 MG (50000 UT) Caps Take 1 capsule by mouth once a week.   Zinc Sulfate 220 (50 Zn) MG Tabs Take 1 tablet by mouth.       No orders of the defined types were placed in this encounter.   Immunization History  Administered Date(s) Administered  . Influenza, High Dose Seasonal PF 03/31/2017  . Influenza-Unspecified 03/17/2018    Social History   Tobacco Use  . Smoking status: Never Smoker  . Smokeless tobacco: Never Used  Substance Use Topics  . Alcohol use: No    Review of Systems   unable to obtain secondary to dementia; nursing-as per history of present illness patient does appear improved today    Vitals:   01/31/19 1321  BP: 100/77  Pulse: 88  Resp: 18  Temp: 98.2  F (36.8 C)   Body mass index is 17.51 kg/m. Physical Exam  GENERAL APPEARANCE: Alert, spoke a few words, No acute distress  SKIN: No diaphoresis rash HEENT: Unremarkable RESPIRATORY: Breathing is even, unlabored. Lung sounds are clear   CARDIOVASCULAR: Heart RRR no murmurs, rubs or gallops. No peripheral edema  GASTROINTESTINAL: Abdomen is soft, non-tender, not distended w/ normal bowel sounds.  GENITOURINARY: Bladder non tender, not distended  MUSCULOSKELETAL: No abnormal joints or musculature NEUROLOGIC: Cranial nerves 2-12 grossly intact. Moves all extremities; patient lying forward in the bed PSYCHIATRIC: , no behavioral issues  Patient Active Problem List   Diagnosis Date Noted  . Urinary tract infection due to Proteus  01/17/2019  . Failure to thrive in adult 10/05/2018  . Chronic constipation 10/05/2018  . Cellulitis of left lower extremity 07/07/2018  . Pneumonia of lower lobe of lung (HCC) 02/09/2018  . Delirium 02/09/2018  . Chronic diastolic (congestive) heart failure (HCC) 02/09/2018  . Hematoma of left lower extremity 02/09/2018  . Acute on chronic diastolic CHF (congestive heart failure) (HCC) 01/30/2018  . Sepsis due to pneumonia (HCC) 01/30/2018  . Agitation 01/30/2018  . Right leg injury, subsequent encounter 01/30/2018  . Benign hypertension with chronic kidney disease, stage III (HCC) 10/06/2017  . Chronic kidney disease, stage III (moderate) (HCC) 10/06/2017  . Restrictive airway disease 10/06/2017  . Fall 09/29/2017  . Acute renal failure superimposed on stage 3 chronic kidney disease (HCC) 09/29/2017  . Bilateral lower extremity edema 08/15/2015  . Paroxysmal supraventricular tachycardia (HCC) 12/06/2013  . Essential hypertension, benign 12/06/2013  . Syncope and collapse 12/06/2013    CMP     Component Value Date/Time   NA 145 02/02/2018 0627   NA 147 10/17/2017   K 3.1 (L) 02/02/2018 0627   CL 109 02/02/2018 0627   CO2 27 02/02/2018 0627   GLUCOSE 95 02/02/2018 0627   BUN 11 02/02/2018 0627   BUN 22 (A) 10/17/2017   CREATININE 0.94 02/02/2018 0627   CREATININE 1.07 (H) 08/15/2015 1422   CALCIUM 8.6 (L) 02/02/2018 0627   PROT 5.1 (L) 01/30/2018 1724   ALBUMIN 3.0 (L) 01/30/2018 1724   AST 43 (H) 01/30/2018 1724   ALT 15 01/30/2018 1724   ALKPHOS 55 01/30/2018 1724   BILITOT 1.8 (H) 01/30/2018 1724   GFRNONAA 50 (L) 02/02/2018 0627   GFRAA 58 (L) 02/02/2018 0627   Recent Labs    02/01/18 0846 02/02/18 0627  NA 143 145  K 3.2* 3.1*  CL 109 109  CO2 25 27  GLUCOSE 99 95  BUN 16 11  CREATININE 0.96 0.94  CALCIUM 8.6* 8.6*   No results for input(s): AST, ALT, ALKPHOS, BILITOT, PROT, ALBUMIN in the last 8760 hours. Recent Labs    02/01/18 0846 02/02/18 0627   WBC 8.3 8.4  HGB 12.8 12.3  HCT 39.4 37.4  MCV 95.4 93.5  PLT 230 268   No results for input(s): CHOL, LDLCALC, TRIG in the last 8760 hours.  Invalid input(s): HCL No results found for: [facility] Lab Results  Component Value Date   TSH 4.553 (H) 01/30/2018   No results found for: HGBA1C Lab Results  Component Value Date   CHOL 167 08/15/2015   HDL 65 08/15/2015   LDLCALC 87 08/15/2015   TRIG 76 08/15/2015   CHOLHDL 2.6 08/15/2015    Significant Diagnostic Results in last 30 days:  No results found.  Assessment and Plan  Dementia/COVID-19 positive- patient laying for today patient spoke some words  even though she was confused her color is better she is on clysis; will continue clysis throughout her stay or until she is eating and drinking; continue to monitor closely    Hennie Duos, MD

## 2019-02-01 ENCOUNTER — Other Ambulatory Visit: Payer: Self-pay

## 2019-02-02 ENCOUNTER — Encounter: Payer: Self-pay | Admitting: Internal Medicine

## 2019-02-02 ENCOUNTER — Non-Acute Institutional Stay (SKILLED_NURSING_FACILITY): Payer: Medicare Other | Admitting: Internal Medicine

## 2019-02-02 DIAGNOSIS — J189 Pneumonia, unspecified organism: Secondary | ICD-10-CM

## 2019-02-02 DIAGNOSIS — U071 COVID-19: Secondary | ICD-10-CM | POA: Diagnosis not present

## 2019-02-02 DIAGNOSIS — B999 Unspecified infectious disease: Secondary | ICD-10-CM | POA: Diagnosis not present

## 2019-02-02 DIAGNOSIS — J181 Lobar pneumonia, unspecified organism: Secondary | ICD-10-CM

## 2019-02-02 DIAGNOSIS — R638 Other symptoms and signs concerning food and fluid intake: Secondary | ICD-10-CM

## 2019-02-02 NOTE — Progress Notes (Signed)
[facility] Room Number: 422-W Place of Service:  SNF (31)  Margit HanksAlexander, Anne D, MD  Patient Care Team: Margit HanksAlexander, Anne D, MD as PCP - General (Internal Medicine)  Extended Emergency Contact Information Primary Emergency Contact: McNeely,Richard Address: [address]          [place], [place] [address] [place] Home Phone: [phone] Relation: Brother    Allergies: Patient has no known allergies.  Chief Complaint  Patient presents with  . Acute Visit    HPI: Patient is a 85 y.o. female who is being seen in follow-up for COVID-19 infection with pneumonia and a UTI.  Patient was treated with Rocephin and Zithromax and has been treated with Decadron 6 mg daily for 10 days.  Today patient is on no O2, her color is better and she speaks a few words to me which is an improvement.  Past Medical History:  Diagnosis Date  . Acne rosacea   . CKD (chronic kidney disease), stage III (HCC)   . Depression   . Hypertension   . Osteopenia   . PUD (peptic ulcer disease)   . Restrictive lung disease   . Shingles   . Tinnitus    and vertigo    History reviewed. No pertinent surgical history.  Allergies as of 02/02/2019   No Known Allergies     Medication List       Accurate as of February 02, 2019  2:53 PM. If you have any questions, ask your nurse or doctor.        Centrum Silver 50+Women Tabs Take 1 tablet by mouth daily.   cetaphil lotion Apply 1 application topically at bedtime. Apply to bilateral lower extremities   clotrimazole-betamethasone cream Commonly known as: LOTRISONE Apply 1 application topically daily as needed (LLE rash).   metoprolol succinate 25 MG 24 hr tablet Commonly known as: TOPROL-XL Take 25 mg by mouth daily.   Nutritional Supplement Liqd Take 120 mLs by mouth 3 (three) times daily. MedPass   polyethylene glycol 17 g packet Commonly known as: MIRALAX / GLYCOLAX Take 17 g by mouth  daily.   potassium chloride 10 MEQ tablet Commonly known as: K-DUR Take 10 mEq by mouth every other day.   sodium chloride 0.9 % infusion Inject into the vein continuous. 60 mL/hour via clysis   vitamin C 1000 MG tablet Take 1,000 mg by mouth daily.   Vitamin D3 1.25 MG (50000 UT) Caps Take 1 capsule by mouth once a week.       No orders of the defined types were placed in this encounter.   Immunization History  Administered Date(s) Administered  . Influenza, High Dose Seasonal PF 03/31/2017  . Influenza-Unspecified 03/17/2018    Social History   Tobacco Use  . Smoking status: Never Smoker  . Smokeless tobacco: Never Used  Substance Use Topics  . Alcohol use: No    Review of Systems unable to obtain secondary to patient's condition and dementia; nursing- patient actually looks better today and is off O2    Vitals:   02/02/19 1447  BP: 109/70  Pulse: 85  Resp: 18  Temp: (!) 97 F (36.1 C)  SpO2: 93%   Body mass index is 17.61 kg/m. Physical Exam  GENERAL APPEARANCE: Alert, spoke a few words no acute distress  SKIN: No diaphoresis rash HEENT: Unremarkable RESPIRATORY: Breathing is even, unlabored. Lung sounds are clear   CARDIOVASCULAR: Heart RRR no murmurs, rubs  or gallops. No peripheral edema  GASTROINTESTINAL: Abdomen is soft, non-tender, not distended w/ normal bowel sounds.  GENITOURINARY: Bladder non tender, not distended  MUSCULOSKELETAL: No abnormal joints or musculature NEUROLOGIC: Cranial nerves 2-12 grossly intact. Moves all extremities PSYCHIATRIC: Dementia, no behavioral issues  Patient Active Problem List   Diagnosis Date Noted  . Urinary tract infection due to Proteus 01/17/2019  . Failure to thrive in adult 10/05/2018  . Chronic constipation 10/05/2018  . Cellulitis of left lower extremity 07/07/2018  . Pneumonia of lower lobe of lung ([place]) 02/09/2018  . Delirium 02/09/2018  . Chronic diastolic (congestive) heart failure ([place])  02/09/2018  . Hematoma of left lower extremity 02/09/2018  . Acute on chronic diastolic CHF (congestive heart failure) ([place]) 01/30/2018  . Sepsis due to pneumonia ([place]) 01/30/2018  . Agitation 01/30/2018  . Right leg injury, subsequent encounter 01/30/2018  . Benign hypertension with chronic kidney disease, stage III ([place]) 10/06/2017  . Chronic kidney disease, stage III (moderate) ([place]) 10/06/2017  . Restrictive airway disease 10/06/2017  . Fall 09/29/2017  . Acute renal failure superimposed on stage 3 chronic kidney disease ([place]) 09/29/2017  . Bilateral lower extremity edema 08/15/2015  . Paroxysmal supraventricular tachycardia ([place]) 12/06/2013  . Essential hypertension, benign 12/06/2013  . Syncope and collapse 12/06/2013    CMP     Component Value Date/Time   NA 145 02/02/2018 0627   NA 147 10/17/2017   K 3.1 (L) 02/02/2018 0627   CL 109 02/02/2018 0627   CO2 27 02/02/2018 0627   GLUCOSE 95 02/02/2018 0627   BUN 11 02/02/2018 0627   BUN 22 (A) 10/17/2017   CREATININE 0.94 02/02/2018 0627   CREATININE 1.07 (H) 08/15/2015 1422   CALCIUM 8.6 (L) 02/02/2018 0627   PROT 5.1 (L) 01/30/2018 1724   ALBUMIN 3.0 (L) 01/30/2018 1724   AST 43 (H) 01/30/2018 1724   ALT 15 01/30/2018 1724   ALKPHOS 55 01/30/2018 1724   BILITOT 1.8 (H) 01/30/2018 1724   GFRNONAA 50 (L) 02/02/2018 0627   GFRAA 58 (L) 02/02/2018 0627   No results for input(s): NA, K, CL, CO2, GLUCOSE, BUN, CREATININE, CALCIUM, MG, PHOS in the last 8760 hours. No results for input(s): AST, ALT, ALKPHOS, BILITOT, PROT, ALBUMIN in the last 8760 hours. No results for input(s): WBC, NEUTROABS, HGB, HCT, MCV, PLT in the last 8760 hours. No results for input(s): CHOL, LDLCALC, TRIG in the last 8760 hours.  Invalid input(s): HCL No results found for: [facility] Lab Results  Component Value Date   TSH 4.553 (H) 01/30/2018   No results found for: HGBA1C Lab Results  Component Value Date   CHOL 167 08/15/2015   HDL 65  08/15/2015   LDLCALC 87 08/15/2015   TRIG 76 08/15/2015   CHOLHDL 2.6 08/15/2015    Significant Diagnostic Results in last 30 days:  No results found.  Assessment and Plan  COVID-19 positive/pneumonia/poor fluid intake- patient has been treated with full antibiotic courses and steroids; patient is now receiving clysis and will continue to receive clysis until she is eating and drinking much better; she does look better today and she is off of her tube; continue supportive care; patient is still in isolation unit requiring full PPE for any evaluations    Hennie Duos, MD

## 2019-02-06 ENCOUNTER — Encounter: Payer: Self-pay | Admitting: Internal Medicine

## 2019-02-08 ENCOUNTER — Non-Acute Institutional Stay (SKILLED_NURSING_FACILITY): Payer: Medicare Other | Admitting: Internal Medicine

## 2019-02-08 DIAGNOSIS — B999 Unspecified infectious disease: Secondary | ICD-10-CM | POA: Diagnosis not present

## 2019-02-08 DIAGNOSIS — R4189 Other symptoms and signs involving cognitive functions and awareness: Secondary | ICD-10-CM | POA: Diagnosis not present

## 2019-02-08 DIAGNOSIS — U071 COVID-19: Secondary | ICD-10-CM

## 2019-02-08 DIAGNOSIS — R638 Other symptoms and signs concerning food and fluid intake: Secondary | ICD-10-CM | POA: Diagnosis not present

## 2019-02-12 ENCOUNTER — Encounter: Payer: Self-pay | Admitting: Internal Medicine

## 2019-02-12 ENCOUNTER — Non-Acute Institutional Stay (SKILLED_NURSING_FACILITY): Payer: Medicare Other | Admitting: Internal Medicine

## 2019-02-12 DIAGNOSIS — U071 COVID-19: Secondary | ICD-10-CM | POA: Diagnosis not present

## 2019-02-12 NOTE — Progress Notes (Signed)
[facility] Room Number: [id number]-W Place of Service:  SNF (31)  Margit HanksAlexander, Perkins Molina D, MD  Patient Care Team: Margit HanksAlexander, Elbert Polyakov D, MD as PCP - General (Internal Medicine)  Extended Emergency Contact Information Primary Emergency Contact: McNeely,Richard Address: [address]          [place], [place] [address] [place] Home Phone: [phone] Relation: Brother    Allergies: Patient has no known allergies.  Chief Complaint  Patient presents with  . Acute Visit    Patient seen on the COVID unit to follow up on COVID positivity status    HPI: Patient is a 85 y.o. female who is beinglo all of her COVID-19.  Patient is no longer on oxygen and is reported that she is started to eat and drink.  Patient is still on clysis..  It was reported that patient set up for 2 hours today.  Patient cannot say anything for herself secondary to her compromised mental status.  Past Medical History:  Diagnosis Date  . Acne rosacea   . CKD (chronic kidney disease), stage III (HCC)   . Depression   . Hypertension   . Osteopenia   . PUD (peptic ulcer disease)   . Restrictive lung disease   . Shingles   . Tinnitus    and vertigo    History reviewed. No pertinent surgical history.  Allergies as of 02/12/2019   No Known Allergies     Medication List       Accurate as of February 12, 2019  4:24 PM. If you have any questions, ask your nurse or doctor.        Centrum Silver 50+Women Tabs Take 1 tablet by mouth daily.   cetaphil lotion Apply 1 application topically at bedtime. Apply to bilateral lower extremities   clotrimazole-betamethasone cream Commonly known as: LOTRISONE Apply 1 application topically daily as needed (LLE rash).   metoprolol succinate 25 MG 24 hr tablet Commonly known as: TOPROL-XL Take 25 mg by mouth daily.   Nutritional Supplement Liqd Take 120 mLs by mouth 3 (three) times daily. MedPass   polyethylene glycol  17 g packet Commonly known as: MIRALAX / GLYCOLAX Take 17 g by mouth daily.   potassium chloride 10 MEQ tablet Commonly known as: K-DUR Take 10 mEq by mouth every other day.   sodium chloride 0.9 % infusion Inject into the vein continuous. 60 mL/hour via clysis   vitamin C 1000 MG tablet Take 1,000 mg by mouth daily.   Vitamin D3 1.25 MG (50000 UT) Caps Take 1 capsule by mouth once a week.       No orders of the defined types were placed in this encounter.   Immunization History  Administered Date(s) Administered  . Influenza, High Dose Seasonal PF 03/31/2017  . Influenza-Unspecified 03/17/2018    Social History   Tobacco Use  . Smoking status: Never Smoker  . Smokeless tobacco: Never Used  Substance Use Topics  . Alcohol use: No    Review of Systems   unable to obtain secondary to mental status; nursing-as per history of present illness, patient is looking better today patient set up for 2 hours today in her chair and patient has started to eat and drink    Vitals:   02/12/19 1615  BP: 120/62  Pulse: 66  Resp: 18  Temp: 97.7 F (36.5 C)  SpO2: 94%   Body mass index is 15.88 kg/m. Physical Exam  GENERAL  APPEARANCE: Alert, minimally conversant but I could understand the words, No acute distress  SKIN: Good color HEENT: Unremarkable RESPIRATORY: Breathing is even, unlabored. Lung sounds are clear; patient is not on O2 CARDIOVASCULAR: Heart RRR no murmurs, rubs or gallops. No peripheral edema  GASTROINTESTINAL: Abdomen is soft, non-tender, not distended w/ normal bowel sounds.  GENITOURINARY: Bladder non tender, not distended  MUSCULOSKELETAL: No abnormal joints or musculature NEUROLOGIC: Cranial nerves 2-12 grossly intact. Moves all extremities PSYCHIATRIC: Mood and affect with dementia much brighter today, no behavioral issues  Patient Active Problem List   Diagnosis Date Noted  . Urinary tract infection due to Proteus 01/17/2019  . Failure to  thrive in adult 10/05/2018  . Chronic constipation 10/05/2018  . Cellulitis of left lower extremity 07/07/2018  . Pneumonia of lower lobe of lung ([place]) 02/09/2018  . Delirium 02/09/2018  . Chronic diastolic (congestive) heart failure ([place]) 02/09/2018  . Hematoma of left lower extremity 02/09/2018  . Acute on chronic diastolic CHF (congestive heart failure) ([place]) 01/30/2018  . Sepsis due to pneumonia ([place]) 01/30/2018  . Agitation 01/30/2018  . Right leg injury, subsequent encounter 01/30/2018  . Benign hypertension with chronic kidney disease, stage III ([place]) 10/06/2017  . Chronic kidney disease, stage III (moderate) ([place]) 10/06/2017  . Restrictive airway disease 10/06/2017  . Fall 09/29/2017  . Acute renal failure superimposed on stage 3 chronic kidney disease ([place]) 09/29/2017  . Bilateral lower extremity edema 08/15/2015  . Paroxysmal supraventricular tachycardia ([place]) 12/06/2013  . Essential hypertension, benign 12/06/2013  . Syncope and collapse 12/06/2013    CMP     Component Value Date/Time   NA 140 01/14/2019   K 4.0 01/14/2019   CL 109 02/02/2018 0627   CO2 27 02/02/2018 0627   GLUCOSE 95 02/02/2018 0627   BUN 19 01/14/2019   CREATININE 0.9 01/14/2019   CREATININE 0.94 02/02/2018 0627   CREATININE 1.07 (H) 08/15/2015 1422   CALCIUM 8.6 (L) 02/02/2018 0627   PROT 5.1 (L) 01/30/2018 1724   ALBUMIN 3.0 (L) 01/30/2018 1724   AST 43 (H) 01/30/2018 1724   ALT 15 01/30/2018 1724   ALKPHOS 55 01/30/2018 1724   BILITOT 1.8 (H) 01/30/2018 1724   GFRNONAA 50 (L) 02/02/2018 0627   GFRAA 58 (L) 02/02/2018 0627   Recent Labs    01/14/19  NA 140  K 4.0  BUN 19  CREATININE 0.9   No results for input(s): AST, ALT, ALKPHOS, BILITOT, PROT, ALBUMIN in the last 8760 hours. Recent Labs    01/13/19  WBC 6.9  NEUTROABS 5  HGB 15.1  HCT 45  PLT 129*   No results for input(s): CHOL, LDLCALC, TRIG in the last 8760 hours.  Invalid input(s): HCL No results found for:  [facility] Lab Results  Component Value Date   TSH 4.553 (H) 01/30/2018   No results found for: HGBA1C Lab Results  Component Value Date   CHOL 167 08/15/2015   HDL 65 08/15/2015   LDLCALC 87 08/15/2015   TRIG 76 08/15/2015   CHOLHDL 2.6 08/15/2015    Significant Diagnostic Results in last 30 days:  No results found.  Assessment and Plan  COVID-19 positive- patient continues to improve; patient is requiring no O2, patient has started to eat and drink, and patient set up for 2 hours today.  Patient's color looks good today and she spoke a few words which were much more intelligible than the last visit.  For someone her age she is done remarkably well; patient is  still on clysis and will keep her on clysis until she is eating and drinking well; I expect recovery in someone of her age to be slow, and that is what I have been seen.  We will continue to monitor closely.  Patient is in the COVID unit which requires full PPE for every visit.    Margit HanksAnne D Albion Weatherholtz, MD

## 2019-02-13 ENCOUNTER — Encounter: Payer: Self-pay | Admitting: Internal Medicine

## 2019-02-13 DIAGNOSIS — R4189 Other symptoms and signs involving cognitive functions and awareness: Secondary | ICD-10-CM | POA: Insufficient documentation

## 2019-02-13 DIAGNOSIS — U071 COVID-19: Secondary | ICD-10-CM | POA: Insufficient documentation

## 2019-02-13 DIAGNOSIS — R638 Other symptoms and signs concerning food and fluid intake: Secondary | ICD-10-CM | POA: Insufficient documentation

## 2019-02-13 NOTE — Progress Notes (Signed)
Location:  [place] of Service:    SNF  Hennie Duos, MD  Patient Care Team: Hennie Duos, MD as PCP - General (Internal Medicine)  Extended Emergency Contact Information Primary Emergency Contact: McNeely,Richard Address: [address]          [place], [place] [address] [place] Phone: [phone] Relation: Brother    Allergies: Patient has no known allergies.  Chief Complaint  Patient presents with  . Acute Visit    HPI: Patient is 85 y.o. female who is being followed up for COVID-19 positivity, status post treatment for pneumonia with Rocephin and Zithromax and for UTI treated with Rocephin.  Patient continues to not eat or drink well and I am keeping a close eye on her.  Today she looks a little better than the last visit and she spoke a little better than the last visit.  Patient continues with poor appetite and continues on clysis.  Past Medical History:  Diagnosis Date  . Acne rosacea   . CKD (chronic kidney disease), stage III ([place])   . Depression   . Hypertension   . Osteopenia   . PUD (peptic ulcer disease)   . Restrictive lung disease   . Shingles   . Tinnitus    and vertigo    History reviewed. No pertinent surgical history.  Allergies as of 02/08/2019   No Known Allergies     Medication List       Accurate as of February 08, 2019 11:59 PM. If you have any questions, ask your nurse or doctor.        Centrum Silver 50+Women Tabs Take 1 tablet by mouth daily.   cetaphil lotion Apply 1 application topically at bedtime. Apply to bilateral lower extremities   clotrimazole-betamethasone cream Commonly known as: LOTRISONE Apply 1 application topically daily as needed (LLE rash).   metoprolol succinate 25 MG 24 hr tablet Commonly known as: TOPROL-XL Take 25 mg by mouth daily.   Nutritional Supplement Liqd Take 120 mLs by mouth 3 (three) times daily. MedPass   polyethylene glycol 17 g  packet Commonly known as: MIRALAX / GLYCOLAX Take 17 g by mouth daily.   potassium chloride 10 MEQ tablet Commonly known as: K-DUR Take 10 mEq by mouth every other day.   sodium chloride 0.9 % infusion Inject into the vein continuous. 60 mL/hour via clysis   vitamin C 1000 MG tablet Take 1,000 mg by mouth daily.   Vitamin D3 1.25 MG (50000 UT) Caps Take 1 capsule by mouth once a week.       No orders of the defined types were placed in this encounter.   Immunization History  Administered Date(s) Administered  . Influenza, High Dose Seasonal PF 03/31/2017  . Influenza-Unspecified 03/17/2018    Social History   Tobacco Use  . Smoking status: Never Smoker  . Smokeless tobacco: Never Used  Substance Use Topics  . Alcohol use: No    Review of Systems  DATA OBTAINED: from nurse GENERAL:  no fevers, +fatigue, +appetite changes SKIN: No itching, rash HEENT: No complaint RESPIRATORY: No cough, wheezing, SOB CARDIAC: No chest pain, palpitations, lower extremity edema  GI: No abdominal pain, No N/V/D or constipation, No heartburn or reflux  GU: No dysuria, frequency or urgency, or incontinence  MUSCULOSKELETAL: No unrelieved bone/joint pain NEUROLOGIC: No headache, dizziness  PSYCHIATRIC: No overt anxiety or sadness  Vitals:   02/13/19 2303  BP: 120/75  Pulse: 74  Resp: 18  Temp: 98 F (36.7 C)   There is no height or weight on file to calculate BMI. Physical Exam  GENERAL APPEARANCE: More alert than prior, spoke a few words a little more intelligibly, No acute distress  SKIN: No diaphoresis rash, color is a little better in the last time I saw her HEENT: Unremarkable RESPIRATORY: Breathing is even, unlabored. Lung sounds are clear   CARDIOVASCULAR: Heart RRR no murmurs, rubs or gallops. No peripheral edema  GASTROINTESTINAL: Abdomen is soft, non-tender, not distended w/ normal bowel sounds.  GENITOURINARY: Bladder non tender, not distended  MUSCULOSKELETAL:  No abnormal joints or musculature NEUROLOGIC: Cranial nerves 2-12 grossly intact. Moves all extremities PSYCHIATRIC: Dementia, patient's cognition is taken hit with this illness, no behavioral issues  Patient Active Problem List   Diagnosis Date Noted  . Urinary tract infection due to Proteus 01/17/2019  . Failure to thrive in adult 10/05/2018  . Chronic constipation 10/05/2018  . Cellulitis of left lower extremity 07/07/2018  . Pneumonia of lower lobe of lung (HCC) 02/09/2018  . Delirium 02/09/2018  . Chronic diastolic (congestive) heart failure (HCC) 02/09/2018  . Hematoma of left lower extremity 02/09/2018  . Acute on chronic diastolic CHF (congestive heart failure) (HCC) 01/30/2018  . Sepsis due to pneumonia (HCC) 01/30/2018  . Agitation 01/30/2018  . Right leg injury, subsequent encounter 01/30/2018  . Benign hypertension with chronic kidney disease, stage III (HCC) 10/06/2017  . Chronic kidney disease, stage III (moderate) (HCC) 10/06/2017  . Restrictive airway disease 10/06/2017  . Fall 09/29/2017  . Acute renal failure superimposed on stage 3 chronic kidney disease (HCC) 09/29/2017  . Bilateral lower extremity edema 08/15/2015  . Paroxysmal supraventricular tachycardia (HCC) 12/06/2013  . Essential hypertension, benign 12/06/2013  . Syncope and collapse 12/06/2013    CMP     Component Value Date/Time   NA 140 01/14/2019   K 4.0 01/14/2019   CL 109 02/02/2018 0627   CO2 27 02/02/2018 0627   GLUCOSE 95 02/02/2018 0627   BUN 19 01/14/2019   CREATININE 0.9 01/14/2019   CREATININE 0.94 02/02/2018 0627   CREATININE 1.07 (H) 08/15/2015 1422   CALCIUM 8.6 (L) 02/02/2018 0627   PROT 5.1 (L) 01/30/2018 1724   ALBUMIN 3.0 (L) 01/30/2018 1724   AST 43 (H) 01/30/2018 1724   ALT 15 01/30/2018 1724   ALKPHOS 55 01/30/2018 1724   BILITOT 1.8 (H) 01/30/2018 1724   GFRNONAA 50 (L) 02/02/2018 0627   GFRAA 58 (L) 02/02/2018 0627   Recent Labs    01/14/19  NA 140  K 4.0  BUN  19  CREATININE 0.9   No results for input(s): AST, ALT, ALKPHOS, BILITOT, PROT, ALBUMIN in the last 8760 hours. Recent Labs    01/13/19  WBC 6.9  NEUTROABS 5  HGB 15.1  HCT 45  PLT 129*   No results for input(s): CHOL, LDLCALC, TRIG in the last 8760 hours.  Invalid input(s): HCL No results found for: [facility] Lab Results  Component Value Date   TSH 4.553 (H) 01/30/2018   No results found for: HGBA1C Lab Results  Component Value Date   CHOL 167 08/15/2015   HDL 65 08/15/2015   LDLCALC 87 08/15/2015   TRIG 76 08/15/2015   CHOLHDL 2.6 08/15/2015    Significant Diagnostic Results in last 30 days:  No results found.  Assessment and Plan  COVID-19 positive/poor p.o. intake/decreased cognition- patient's color is a little bit better patient is a little bit  more alert, patient is speaking a little better today; these are all small changes but they are changes in the right direction; patient is 85 years old and so far has survived COVID-19 which is something; will continue clysis until patient's p.o. intake is good; will continue to monitor very closely; patient is in the COVID unit which requires full PPE for any visit or evaluation  No problem-specific Assessment & Plan notes found for this encounter.   Labs/tests ordered:    Margit HanksAnne D Hassan Blackshire, MD

## 2019-02-14 ENCOUNTER — Encounter: Payer: Self-pay | Admitting: Internal Medicine

## 2019-02-16 ENCOUNTER — Encounter: Payer: Self-pay | Admitting: Internal Medicine

## 2019-02-16 ENCOUNTER — Non-Acute Institutional Stay (SKILLED_NURSING_FACILITY): Payer: Medicare Other | Admitting: Internal Medicine

## 2019-02-16 DIAGNOSIS — N183 Chronic kidney disease, stage 3 unspecified: Secondary | ICD-10-CM

## 2019-02-16 DIAGNOSIS — R600 Localized edema: Secondary | ICD-10-CM

## 2019-02-16 DIAGNOSIS — I10 Essential (primary) hypertension: Secondary | ICD-10-CM

## 2019-02-16 NOTE — Progress Notes (Signed)
Location:  [facility] Room Number: 422-W Place of Service:  SNF (31)  Hennie Duos, MD  Patient Care Team: Hennie Duos, MD as PCP - General (Internal Medicine)  Extended Emergency Contact Information Primary Emergency Contact: McNeely,Richard Address: [address]          [place], [place] [address] [place] Phone: [phone] Relation: Brother    Allergies: Patient has no known allergies.  Chief Complaint  Patient presents with  . Medical Management of Chronic Issues    Routine [facility] visit    HPI: Patient is a 85 y.o. female who is recuperating from [place] in the [place] unit and he is appetite is starting to come back although she is still on clysis but today patient is being seen for routine issues of hypertension, chronic kidney disease stage III, and bilateral lower extremity edema.  Past Medical History:  Diagnosis Date  . Acne rosacea   . CKD (chronic kidney disease), stage III ([place])   . Depression   . Hypertension   . Osteopenia   . PUD (peptic ulcer disease)   . Restrictive lung disease   . Shingles   . Tinnitus    and vertigo    History reviewed. No pertinent surgical history.  Allergies as of 02/16/2019   No Known Allergies     Medication List       Accurate as of February 16, 2019 11:59 PM. If you have any questions, ask your nurse or doctor.        Centrum Silver 50+Women Tabs Take 1 tablet by mouth daily.   cetaphil lotion Apply 1 application topically at bedtime. Apply to bilateral lower extremities   clotrimazole-betamethasone cream Commonly known as: LOTRISONE Apply 1 application topically daily as needed (LLE rash).   metoprolol succinate 25 MG 24 hr tablet Commonly known as: TOPROL-XL Take 25 mg by mouth daily.   Nutritional Supplement Liqd Take 120 mLs by mouth 3 (three) times daily. MedPass   polyethylene glycol 17 g packet Commonly known as: MIRALAX /  GLYCOLAX Take 17 g by mouth daily.   potassium chloride 10 MEQ tablet Commonly known as: K-DUR Take 10 mEq by mouth every other day.   sodium chloride 0.45 % solution Inject 60 mLs into the vein continuous. 60 mL/hour via clysis What changed: Another medication with the same name was removed. Continue taking this medication, and follow the directions you see here. Changed by: Inocencio Homes, MD   vitamin C 1000 MG tablet Take 1,000 mg by mouth daily.   Vitamin D3 1.25 MG (50000 UT) Caps Take 1 capsule by mouth once a week.       No orders of the defined types were placed in this encounter.   Immunization History  Administered Date(s) Administered  . Influenza, High Dose Seasonal PF 03/31/2017  . Influenza-Unspecified 03/17/2018    Social History   Tobacco Use  . Smoking status: Never Smoker  . Smokeless tobacco: Never Used  Substance Use Topics  . Alcohol use: No    Review of Systems    unable to obtain secondary to depressed mentation secondary to COVID; nursing- patient's appetite is improving daily and she is becoming more talkative and    Vitals:   02/16/19 0906  BP: 114/65  Pulse: 84  Resp: 18  Temp: 98.4 F (36.9 C)  SpO2: 94%   Body mass index is 16.38 kg/m. Physical Exam  GENERAL APPEARANCE:  Alert, conversant, No acute distress  SKIN: No diaphoresis rash HEENT: Unremarkable RESPIRATORY: Breathing is even, unlabored. Lung sounds are clear   CARDIOVASCULAR: Heart RRR no murmurs, rubs or gallops. No peripheral edema  GASTROINTESTINAL: Abdomen is soft, non-tender, not distended w/ normal bowel sounds.  GENITOURINARY: Bladder non tender, not distended  MUSCULOSKELETAL: No abnormal joints or musculature NEUROLOGIC: Cranial nerves 2-12 grossly intact. Moves all extremities PSYCHIATRIC: , no behavioral issues  Patient Active Problem List   Diagnosis Date Noted  . Real time reverse transcriptase PCR positive for COVID-19 virus 02/13/2019  . Poor  fluid intake 02/13/2019  . Alteration in cognition 02/13/2019  . Urinary tract infection due to Proteus 01/17/2019  . Failure to thrive in adult 10/05/2018  . Chronic constipation 10/05/2018  . Cellulitis of left lower extremity 07/07/2018  . Pneumonia of lower lobe of lung (HCC) 02/09/2018  . Delirium 02/09/2018  . Chronic diastolic (congestive) heart failure (HCC) 02/09/2018  . Hematoma of left lower extremity 02/09/2018  . Acute on chronic diastolic CHF (congestive heart failure) (HCC) 01/30/2018  . Sepsis due to pneumonia (HCC) 01/30/2018  . Agitation 01/30/2018  . Right leg injury, subsequent encounter 01/30/2018  . Benign hypertension with chronic kidney disease, stage III (HCC) 10/06/2017  . Chronic kidney disease, stage III (moderate) (HCC) 10/06/2017  . Restrictive airway disease 10/06/2017  . Fall 09/29/2017  . Acute renal failure superimposed on stage 3 chronic kidney disease (HCC) 09/29/2017  . Bilateral lower extremity edema 08/15/2015  . Paroxysmal supraventricular tachycardia (HCC) 12/06/2013  . Essential hypertension, benign 12/06/2013  . Syncope and collapse 12/06/2013    CMP     Component Value Date/Time   NA 140 01/14/2019   K 4.0 01/14/2019   CL 109 02/02/2018 0627   CO2 27 02/02/2018 0627   GLUCOSE 95 02/02/2018 0627   BUN 19 01/14/2019   CREATININE 0.9 01/14/2019   CREATININE 0.94 02/02/2018 0627   CREATININE 1.07 (H) 08/15/2015 1422   CALCIUM 8.6 (L) 02/02/2018 0627   PROT 5.1 (L) 01/30/2018 1724   ALBUMIN 3.0 (L) 01/30/2018 1724   AST 43 (H) 01/30/2018 1724   ALT 15 01/30/2018 1724   ALKPHOS 55 01/30/2018 1724   BILITOT 1.8 (H) 01/30/2018 1724   GFRNONAA 50 (L) 02/02/2018 0627   GFRAA 58 (L) 02/02/2018 0627   Recent Labs    12/15/18 01/14/19  NA 146 140  K 3.9 4.0  BUN 22* 19  CREATININE 1.1 0.9   No results for input(s): AST, ALT, ALKPHOS, BILITOT, PROT, ALBUMIN in the last 8760 hours. Recent Labs    01/13/19  WBC 6.9  NEUTROABS 5   HGB 15.1  HCT 45  PLT 129*   No results for input(s): CHOL, LDLCALC, TRIG in the last 8760 hours.  Invalid input(s): HCL No results found for: [facility] Lab Results  Component Value Date   TSH 4.553 (H) 01/30/2018   No results found for: HGBA1C Lab Results  Component Value Date   CHOL 167 08/15/2015   HDL 65 08/15/2015   LDLCALC 87 08/15/2015   TRIG 76 08/15/2015   CHOLHDL 2.6 08/15/2015    Significant Diagnostic Results in last 30 days:  No results found.  Assessment and Plan  Essential hypertension, benign Controlled; continue Toprol-XL 25 mg 1 p.o. daily  Chronic kidney disease, stage III (moderate) (HCC) No recent GFR; creatinine 0.9 which is improved from prior and stable will monitor intervals  Bilateral lower extremity edema Patient has been getting clysis because of poor  p.o. intake secondary to COVID says she has some lower extremity edema, which is being watched, but her getting fluid and coming back with her appetite is more important we will continue to monitor and continue supportive care    Margit HanksAnne D Perrie Ragin, MD

## 2019-02-19 ENCOUNTER — Encounter: Payer: Self-pay | Admitting: Internal Medicine

## 2019-02-19 ENCOUNTER — Non-Acute Institutional Stay (SKILLED_NURSING_FACILITY): Payer: Medicare Other | Admitting: Internal Medicine

## 2019-02-19 DIAGNOSIS — M62461 Contracture of muscle, right lower leg: Secondary | ICD-10-CM | POA: Diagnosis not present

## 2019-02-19 DIAGNOSIS — U071 COVID-19: Secondary | ICD-10-CM

## 2019-02-19 NOTE — Assessment & Plan Note (Signed)
Patient has been getting clysis because of poor p.o. intake secondary to COVID says she has some lower extremity edema, which is being watched, but her getting fluid and coming back with her appetite is more important we will continue to monitor and continue supportive care

## 2019-02-19 NOTE — Assessment & Plan Note (Signed)
No recent GFR; creatinine 0.9 which is improved from prior and stable will monitor intervals

## 2019-02-19 NOTE — Assessment & Plan Note (Signed)
Controlled; continue Toprol-XL 25 mg 1 p.o. daily

## 2019-02-22 ENCOUNTER — Encounter: Payer: Self-pay | Admitting: Internal Medicine

## 2019-02-22 NOTE — Progress Notes (Signed)
Location:  [facility] Room Number: 422-W Place of Service:  SNF (31)  Madeline Duos, MD  Patient Care Team: Madeline Duos, MD as PCP - General (Internal Medicine)  Extended Emergency Contact Information Primary Emergency Contact: McNeely,Richard Address: [address]          [place], [place] [address] [place] Phone: [phone] Relation: Brother    Allergies: Patient has no known allergies.  Chief Complaint  Patient presents with  . Acute Visit    Followup of COVID positive status    HPI: Patient is a 85 y.o. female who is being seen in follow-up for COVID.  Patient appears to be doing much much better.  She is reporting to be eating 75% and she is also drinking.  She does have 2 wounds that have been there prior one is a stage II on her right leg and one is a stage I to her left heel.  Patient does have a right lower extremity contracture from lying in bed with her knee in flexion.  Past Medical History:  Diagnosis Date  . Acne rosacea   . CKD (chronic kidney disease), stage III ([place])   . Depression   . Hypertension   . Osteopenia   . PUD (peptic ulcer disease)   . Restrictive lung disease   . Shingles   . Tinnitus    and vertigo    History reviewed. No pertinent surgical history.  Allergies as of 02/19/2019   No Known Allergies     Medication List       Accurate as of February 19, 2019 11:59 PM. If you have any questions, ask your nurse or doctor.        STOP taking these medications   Centrum Silver 50+Women Tabs Stopped by: Inocencio Homes, MD     TAKE these medications   cetaphil lotion Apply 1 application topically at bedtime. Apply to bilateral lower extremities   clotrimazole-betamethasone cream Commonly known as: LOTRISONE Apply 1 application topically daily as needed (LLE rash).   metoprolol succinate 25 MG 24 hr tablet Commonly known as: TOPROL-XL Take 25 mg by mouth daily.    Nutritional Supplement Liqd Take 120 mLs by mouth 3 (three) times daily. MedPass   polyethylene glycol 17 g packet Commonly known as: MIRALAX / GLYCOLAX Take 17 g by mouth daily.   potassium chloride 10 MEQ tablet Commonly known as: K-DUR Take 10 mEq by mouth every other day.   sodium chloride 0.45 % solution Inject 60 mLs into the vein continuous. 60 mL/hour via clysis   vitamin C 1000 MG tablet Take 1,000 mg by mouth daily.   Vitamin D3 1.25 MG (50000 UT) Caps Take 1 capsule by mouth once a week.       No orders of the defined types were placed in this encounter.   Immunization History  Administered Date(s) Administered  . Influenza, High Dose Seasonal PF 03/31/2017  . Influenza-Unspecified 03/17/2018    Social History   Tobacco Use  . Smoking status: Never Smoker  . Smokeless tobacco: Never Used  Substance Use Topics  . Alcohol use: No    Review of Systems  DATA OBTAINED: from nurse GENERAL:  no fevers, fatigue, appetite changes SKIN: No itching, rash HEENT: No complaint RESPIRATORY: No cough, wheezing, SOB CARDIAC: No chest pain, palpitations, lower extremity edema  GI: No abdominal pain, No N/V/D or constipation, No heartburn or reflux  GU: No dysuria,  frequency or urgency, or incontinence  MUSCULOSKELETAL: No unrelieved bone/joint pain NEUROLOGIC: No headache, dizziness  PSYCHIATRIC: No overt anxiety or sadness  Vitals:   02/22/19 1427  BP: 120/76  Pulse: 82  Resp: 18  Temp: 97.9 F (36.6 C)  SpO2: 94%   Body mass index is 18.33 kg/m. Physical Exam  GENERAL APPEARANCE: Alert, conversant, No acute distress  SKIN: No diaphoresis rash; wounds dressed and not visualized HEENT: Unremarkable RESPIRATORY: Breathing is even, unlabored. Lung sounds are clear   CARDIOVASCULAR: Heart RRR no murmurs, rubs or gallops. No peripheral edema  GASTROINTESTINAL: Abdomen is soft, non-tender, not distended w/ normal bowel sounds.  GENITOURINARY: Bladder non  tender, not distended  MUSCULOSKELETAL: No abnormal joints or musculature except patient does have contracture across her right knee NEUROLOGIC: Cranial nerves 2-12 grossly intact. Moves all extremities PSYCHIATRIC: Still some confusion, no behavioral issues  Patient Active Problem List   Diagnosis Date Noted  . Real time reverse transcriptase PCR positive for COVID-19 virus 02/13/2019  . Poor fluid intake 02/13/2019  . Alteration in cognition 02/13/2019  . Urinary tract infection due to Proteus 01/17/2019  . Failure to thrive in adult 10/05/2018  . Chronic constipation 10/05/2018  . Cellulitis of left lower extremity 07/07/2018  . Pneumonia of lower lobe of lung (HCC) 02/09/2018  . Delirium 02/09/2018  . Chronic diastolic (congestive) heart failure (HCC) 02/09/2018  . Hematoma of left lower extremity 02/09/2018  . Acute on chronic diastolic CHF (congestive heart failure) (HCC) 01/30/2018  . Sepsis due to pneumonia (HCC) 01/30/2018  . Agitation 01/30/2018  . Right leg injury, subsequent encounter 01/30/2018  . Benign hypertension with chronic kidney disease, stage III (HCC) 10/06/2017  . Chronic kidney disease, stage III (moderate) (HCC) 10/06/2017  . Restrictive airway disease 10/06/2017  . Fall 09/29/2017  . Acute renal failure superimposed on stage 3 chronic kidney disease (HCC) 09/29/2017  . Bilateral lower extremity edema 08/15/2015  . Paroxysmal supraventricular tachycardia (HCC) 12/06/2013  . Essential hypertension, benign 12/06/2013  . Syncope and collapse 12/06/2013    CMP     Component Value Date/Time   NA 140 01/14/2019   K 4.0 01/14/2019   CL 109 02/02/2018 0627   CO2 27 02/02/2018 0627   GLUCOSE 95 02/02/2018 0627   BUN 19 01/14/2019   CREATININE 0.9 01/14/2019   CREATININE 0.94 02/02/2018 0627   CREATININE 1.07 (H) 08/15/2015 1422   CALCIUM 8.6 (L) 02/02/2018 0627   PROT 5.1 (L) 01/30/2018 1724   ALBUMIN 3.0 (L) 01/30/2018 1724   AST 43 (H) 01/30/2018  1724   ALT 15 01/30/2018 1724   ALKPHOS 55 01/30/2018 1724   BILITOT 1.8 (H) 01/30/2018 1724   GFRNONAA 50 (L) 02/02/2018 0627   GFRAA 58 (L) 02/02/2018 0627   Recent Labs    12/15/18 01/14/19  NA 146 140  K 3.9 4.0  BUN 22* 19  CREATININE 1.1 0.9   No results for input(s): AST, ALT, ALKPHOS, BILITOT, PROT, ALBUMIN in the last 8760 hours. Recent Labs    01/13/19  WBC 6.9  NEUTROABS 5  HGB 15.1  HCT 45  PLT 129*   No results for input(s): CHOL, LDLCALC, TRIG in the last 8760 hours.  Invalid input(s): HCL No results found for: [facility] Lab Results  Component Value Date   TSH 4.553 (H) 01/30/2018   No results found for: HGBA1C Lab Results  Component Value Date   CHOL 167 08/15/2015   HDL 65 08/15/2015   LDLCALC 87 08/15/2015  TRIG 76 08/15/2015   CHOLHDL 2.6 08/15/2015    Significant Diagnostic Results in last 30 days:  No results found.  Assessment and Plan  COVID-19 positive/right knee contracture- patient appears to be coming out of her COVID infection and she is eating and drinking more.  PT has been consulted for her right lower extremity contracture; patient is in the COVID unit requiring full PPE for any visit      Margit HanksAnne D Zadia Uhde, MD

## 2019-02-23 DIAGNOSIS — R1312 Dysphagia, oropharyngeal phase: Secondary | ICD-10-CM | POA: Diagnosis not present

## 2019-02-23 DIAGNOSIS — M6281 Muscle weakness (generalized): Secondary | ICD-10-CM | POA: Diagnosis not present

## 2019-02-23 DIAGNOSIS — E46 Unspecified protein-calorie malnutrition: Secondary | ICD-10-CM | POA: Diagnosis not present

## 2019-02-23 DIAGNOSIS — M6249 Contracture of muscle, multiple sites: Secondary | ICD-10-CM | POA: Diagnosis not present

## 2019-02-23 DIAGNOSIS — I69828 Other speech and language deficits following other cerebrovascular disease: Secondary | ICD-10-CM | POA: Diagnosis not present

## 2019-02-23 DIAGNOSIS — F0390 Unspecified dementia without behavioral disturbance: Secondary | ICD-10-CM | POA: Diagnosis not present

## 2019-02-24 ENCOUNTER — Encounter: Payer: Self-pay | Admitting: Internal Medicine

## 2019-02-24 DIAGNOSIS — E46 Unspecified protein-calorie malnutrition: Secondary | ICD-10-CM | POA: Diagnosis not present

## 2019-02-24 DIAGNOSIS — I69828 Other speech and language deficits following other cerebrovascular disease: Secondary | ICD-10-CM | POA: Diagnosis not present

## 2019-02-24 DIAGNOSIS — R1312 Dysphagia, oropharyngeal phase: Secondary | ICD-10-CM | POA: Diagnosis not present

## 2019-02-24 DIAGNOSIS — M6249 Contracture of muscle, multiple sites: Secondary | ICD-10-CM | POA: Diagnosis not present

## 2019-02-24 DIAGNOSIS — M6281 Muscle weakness (generalized): Secondary | ICD-10-CM | POA: Diagnosis not present

## 2019-02-24 DIAGNOSIS — F0390 Unspecified dementia without behavioral disturbance: Secondary | ICD-10-CM | POA: Diagnosis not present

## 2019-02-25 DIAGNOSIS — F0390 Unspecified dementia without behavioral disturbance: Secondary | ICD-10-CM | POA: Diagnosis not present

## 2019-02-25 DIAGNOSIS — M6249 Contracture of muscle, multiple sites: Secondary | ICD-10-CM | POA: Diagnosis not present

## 2019-02-25 DIAGNOSIS — I69828 Other speech and language deficits following other cerebrovascular disease: Secondary | ICD-10-CM | POA: Diagnosis not present

## 2019-02-25 DIAGNOSIS — M6281 Muscle weakness (generalized): Secondary | ICD-10-CM | POA: Diagnosis not present

## 2019-02-25 DIAGNOSIS — R1312 Dysphagia, oropharyngeal phase: Secondary | ICD-10-CM | POA: Diagnosis not present

## 2019-02-25 DIAGNOSIS — E46 Unspecified protein-calorie malnutrition: Secondary | ICD-10-CM | POA: Diagnosis not present

## 2019-02-26 DIAGNOSIS — M6249 Contracture of muscle, multiple sites: Secondary | ICD-10-CM | POA: Diagnosis not present

## 2019-02-26 DIAGNOSIS — F0390 Unspecified dementia without behavioral disturbance: Secondary | ICD-10-CM | POA: Diagnosis not present

## 2019-02-26 DIAGNOSIS — R1312 Dysphagia, oropharyngeal phase: Secondary | ICD-10-CM | POA: Diagnosis not present

## 2019-02-26 DIAGNOSIS — E46 Unspecified protein-calorie malnutrition: Secondary | ICD-10-CM | POA: Diagnosis not present

## 2019-02-26 DIAGNOSIS — I69828 Other speech and language deficits following other cerebrovascular disease: Secondary | ICD-10-CM | POA: Diagnosis not present

## 2019-02-26 DIAGNOSIS — M6281 Muscle weakness (generalized): Secondary | ICD-10-CM | POA: Diagnosis not present

## 2019-02-27 DIAGNOSIS — I69828 Other speech and language deficits following other cerebrovascular disease: Secondary | ICD-10-CM | POA: Diagnosis not present

## 2019-02-27 DIAGNOSIS — E46 Unspecified protein-calorie malnutrition: Secondary | ICD-10-CM | POA: Diagnosis not present

## 2019-02-27 DIAGNOSIS — M6249 Contracture of muscle, multiple sites: Secondary | ICD-10-CM | POA: Diagnosis not present

## 2019-02-27 DIAGNOSIS — M6281 Muscle weakness (generalized): Secondary | ICD-10-CM | POA: Diagnosis not present

## 2019-02-27 DIAGNOSIS — F0390 Unspecified dementia without behavioral disturbance: Secondary | ICD-10-CM | POA: Diagnosis not present

## 2019-02-27 DIAGNOSIS — R1312 Dysphagia, oropharyngeal phase: Secondary | ICD-10-CM | POA: Diagnosis not present

## 2019-02-28 DIAGNOSIS — E46 Unspecified protein-calorie malnutrition: Secondary | ICD-10-CM | POA: Diagnosis not present

## 2019-02-28 DIAGNOSIS — M6249 Contracture of muscle, multiple sites: Secondary | ICD-10-CM | POA: Diagnosis not present

## 2019-02-28 DIAGNOSIS — F0390 Unspecified dementia without behavioral disturbance: Secondary | ICD-10-CM | POA: Diagnosis not present

## 2019-02-28 DIAGNOSIS — M6281 Muscle weakness (generalized): Secondary | ICD-10-CM | POA: Diagnosis not present

## 2019-02-28 DIAGNOSIS — I69828 Other speech and language deficits following other cerebrovascular disease: Secondary | ICD-10-CM | POA: Diagnosis not present

## 2019-02-28 DIAGNOSIS — R1312 Dysphagia, oropharyngeal phase: Secondary | ICD-10-CM | POA: Diagnosis not present

## 2019-03-01 DIAGNOSIS — I69828 Other speech and language deficits following other cerebrovascular disease: Secondary | ICD-10-CM | POA: Diagnosis not present

## 2019-03-01 DIAGNOSIS — F0390 Unspecified dementia without behavioral disturbance: Secondary | ICD-10-CM | POA: Diagnosis not present

## 2019-03-01 DIAGNOSIS — E46 Unspecified protein-calorie malnutrition: Secondary | ICD-10-CM | POA: Diagnosis not present

## 2019-03-01 DIAGNOSIS — R1312 Dysphagia, oropharyngeal phase: Secondary | ICD-10-CM | POA: Diagnosis not present

## 2019-03-01 DIAGNOSIS — M6249 Contracture of muscle, multiple sites: Secondary | ICD-10-CM | POA: Diagnosis not present

## 2019-03-01 DIAGNOSIS — M6281 Muscle weakness (generalized): Secondary | ICD-10-CM | POA: Diagnosis not present

## 2019-03-02 DIAGNOSIS — M6281 Muscle weakness (generalized): Secondary | ICD-10-CM | POA: Diagnosis not present

## 2019-03-02 DIAGNOSIS — E46 Unspecified protein-calorie malnutrition: Secondary | ICD-10-CM | POA: Diagnosis not present

## 2019-03-02 DIAGNOSIS — I69828 Other speech and language deficits following other cerebrovascular disease: Secondary | ICD-10-CM | POA: Diagnosis not present

## 2019-03-02 DIAGNOSIS — L03116 Cellulitis of left lower limb: Secondary | ICD-10-CM | POA: Diagnosis not present

## 2019-03-02 DIAGNOSIS — M6249 Contracture of muscle, multiple sites: Secondary | ICD-10-CM | POA: Diagnosis not present

## 2019-03-02 DIAGNOSIS — R1312 Dysphagia, oropharyngeal phase: Secondary | ICD-10-CM | POA: Diagnosis not present

## 2019-03-02 DIAGNOSIS — F0390 Unspecified dementia without behavioral disturbance: Secondary | ICD-10-CM | POA: Diagnosis not present

## 2019-03-03 DIAGNOSIS — I69828 Other speech and language deficits following other cerebrovascular disease: Secondary | ICD-10-CM | POA: Diagnosis not present

## 2019-03-03 DIAGNOSIS — M6281 Muscle weakness (generalized): Secondary | ICD-10-CM | POA: Diagnosis not present

## 2019-03-03 DIAGNOSIS — E46 Unspecified protein-calorie malnutrition: Secondary | ICD-10-CM | POA: Diagnosis not present

## 2019-03-03 DIAGNOSIS — L03116 Cellulitis of left lower limb: Secondary | ICD-10-CM | POA: Diagnosis not present

## 2019-03-03 DIAGNOSIS — M6249 Contracture of muscle, multiple sites: Secondary | ICD-10-CM | POA: Diagnosis not present

## 2019-03-03 DIAGNOSIS — F0390 Unspecified dementia without behavioral disturbance: Secondary | ICD-10-CM | POA: Diagnosis not present

## 2019-03-04 DIAGNOSIS — L03116 Cellulitis of left lower limb: Secondary | ICD-10-CM | POA: Diagnosis not present

## 2019-03-04 DIAGNOSIS — I69828 Other speech and language deficits following other cerebrovascular disease: Secondary | ICD-10-CM | POA: Diagnosis not present

## 2019-03-04 DIAGNOSIS — M6249 Contracture of muscle, multiple sites: Secondary | ICD-10-CM | POA: Diagnosis not present

## 2019-03-04 DIAGNOSIS — E46 Unspecified protein-calorie malnutrition: Secondary | ICD-10-CM | POA: Diagnosis not present

## 2019-03-04 DIAGNOSIS — F0390 Unspecified dementia without behavioral disturbance: Secondary | ICD-10-CM | POA: Diagnosis not present

## 2019-03-04 DIAGNOSIS — M6281 Muscle weakness (generalized): Secondary | ICD-10-CM | POA: Diagnosis not present

## 2019-03-05 DIAGNOSIS — I69828 Other speech and language deficits following other cerebrovascular disease: Secondary | ICD-10-CM | POA: Diagnosis not present

## 2019-03-05 DIAGNOSIS — L03116 Cellulitis of left lower limb: Secondary | ICD-10-CM | POA: Diagnosis not present

## 2019-03-05 DIAGNOSIS — E46 Unspecified protein-calorie malnutrition: Secondary | ICD-10-CM | POA: Diagnosis not present

## 2019-03-05 DIAGNOSIS — M6249 Contracture of muscle, multiple sites: Secondary | ICD-10-CM | POA: Diagnosis not present

## 2019-03-05 DIAGNOSIS — F0390 Unspecified dementia without behavioral disturbance: Secondary | ICD-10-CM | POA: Diagnosis not present

## 2019-03-05 DIAGNOSIS — M6281 Muscle weakness (generalized): Secondary | ICD-10-CM | POA: Diagnosis not present

## 2019-03-07 DIAGNOSIS — E46 Unspecified protein-calorie malnutrition: Secondary | ICD-10-CM | POA: Diagnosis not present

## 2019-03-07 DIAGNOSIS — I69828 Other speech and language deficits following other cerebrovascular disease: Secondary | ICD-10-CM | POA: Diagnosis not present

## 2019-03-07 DIAGNOSIS — F0390 Unspecified dementia without behavioral disturbance: Secondary | ICD-10-CM | POA: Diagnosis not present

## 2019-03-07 DIAGNOSIS — M6281 Muscle weakness (generalized): Secondary | ICD-10-CM | POA: Diagnosis not present

## 2019-03-07 DIAGNOSIS — L03116 Cellulitis of left lower limb: Secondary | ICD-10-CM | POA: Diagnosis not present

## 2019-03-07 DIAGNOSIS — M6249 Contracture of muscle, multiple sites: Secondary | ICD-10-CM | POA: Diagnosis not present

## 2019-03-08 DIAGNOSIS — L03116 Cellulitis of left lower limb: Secondary | ICD-10-CM | POA: Diagnosis not present

## 2019-03-08 DIAGNOSIS — F0390 Unspecified dementia without behavioral disturbance: Secondary | ICD-10-CM | POA: Diagnosis not present

## 2019-03-08 DIAGNOSIS — M6281 Muscle weakness (generalized): Secondary | ICD-10-CM | POA: Diagnosis not present

## 2019-03-08 DIAGNOSIS — I69828 Other speech and language deficits following other cerebrovascular disease: Secondary | ICD-10-CM | POA: Diagnosis not present

## 2019-03-08 DIAGNOSIS — M6249 Contracture of muscle, multiple sites: Secondary | ICD-10-CM | POA: Diagnosis not present

## 2019-03-08 DIAGNOSIS — E46 Unspecified protein-calorie malnutrition: Secondary | ICD-10-CM | POA: Diagnosis not present

## 2019-03-09 DIAGNOSIS — E46 Unspecified protein-calorie malnutrition: Secondary | ICD-10-CM | POA: Diagnosis not present

## 2019-03-09 DIAGNOSIS — L03116 Cellulitis of left lower limb: Secondary | ICD-10-CM | POA: Diagnosis not present

## 2019-03-09 DIAGNOSIS — F0390 Unspecified dementia without behavioral disturbance: Secondary | ICD-10-CM | POA: Diagnosis not present

## 2019-03-09 DIAGNOSIS — M6249 Contracture of muscle, multiple sites: Secondary | ICD-10-CM | POA: Diagnosis not present

## 2019-03-09 DIAGNOSIS — I69828 Other speech and language deficits following other cerebrovascular disease: Secondary | ICD-10-CM | POA: Diagnosis not present

## 2019-03-09 DIAGNOSIS — M6281 Muscle weakness (generalized): Secondary | ICD-10-CM | POA: Diagnosis not present

## 2019-03-10 DIAGNOSIS — F0390 Unspecified dementia without behavioral disturbance: Secondary | ICD-10-CM | POA: Diagnosis not present

## 2019-03-10 DIAGNOSIS — L03116 Cellulitis of left lower limb: Secondary | ICD-10-CM | POA: Diagnosis not present

## 2019-03-10 DIAGNOSIS — E46 Unspecified protein-calorie malnutrition: Secondary | ICD-10-CM | POA: Diagnosis not present

## 2019-03-10 DIAGNOSIS — M6249 Contracture of muscle, multiple sites: Secondary | ICD-10-CM | POA: Diagnosis not present

## 2019-03-10 DIAGNOSIS — I69828 Other speech and language deficits following other cerebrovascular disease: Secondary | ICD-10-CM | POA: Diagnosis not present

## 2019-03-10 DIAGNOSIS — M6281 Muscle weakness (generalized): Secondary | ICD-10-CM | POA: Diagnosis not present

## 2019-03-11 DIAGNOSIS — M6281 Muscle weakness (generalized): Secondary | ICD-10-CM | POA: Diagnosis not present

## 2019-03-11 DIAGNOSIS — E46 Unspecified protein-calorie malnutrition: Secondary | ICD-10-CM | POA: Diagnosis not present

## 2019-03-11 DIAGNOSIS — F0390 Unspecified dementia without behavioral disturbance: Secondary | ICD-10-CM | POA: Diagnosis not present

## 2019-03-11 DIAGNOSIS — L03116 Cellulitis of left lower limb: Secondary | ICD-10-CM | POA: Diagnosis not present

## 2019-03-11 DIAGNOSIS — I69828 Other speech and language deficits following other cerebrovascular disease: Secondary | ICD-10-CM | POA: Diagnosis not present

## 2019-03-11 DIAGNOSIS — M6249 Contracture of muscle, multiple sites: Secondary | ICD-10-CM | POA: Diagnosis not present

## 2019-03-12 DIAGNOSIS — I69828 Other speech and language deficits following other cerebrovascular disease: Secondary | ICD-10-CM | POA: Diagnosis not present

## 2019-03-12 DIAGNOSIS — M6281 Muscle weakness (generalized): Secondary | ICD-10-CM | POA: Diagnosis not present

## 2019-03-12 DIAGNOSIS — E46 Unspecified protein-calorie malnutrition: Secondary | ICD-10-CM | POA: Diagnosis not present

## 2019-03-12 DIAGNOSIS — M6249 Contracture of muscle, multiple sites: Secondary | ICD-10-CM | POA: Diagnosis not present

## 2019-03-12 DIAGNOSIS — L03116 Cellulitis of left lower limb: Secondary | ICD-10-CM | POA: Diagnosis not present

## 2019-03-12 DIAGNOSIS — F0390 Unspecified dementia without behavioral disturbance: Secondary | ICD-10-CM | POA: Diagnosis not present

## 2019-03-13 DIAGNOSIS — E46 Unspecified protein-calorie malnutrition: Secondary | ICD-10-CM | POA: Diagnosis not present

## 2019-03-13 DIAGNOSIS — I69828 Other speech and language deficits following other cerebrovascular disease: Secondary | ICD-10-CM | POA: Diagnosis not present

## 2019-03-13 DIAGNOSIS — M6281 Muscle weakness (generalized): Secondary | ICD-10-CM | POA: Diagnosis not present

## 2019-03-13 DIAGNOSIS — L03116 Cellulitis of left lower limb: Secondary | ICD-10-CM | POA: Diagnosis not present

## 2019-03-13 DIAGNOSIS — F0390 Unspecified dementia without behavioral disturbance: Secondary | ICD-10-CM | POA: Diagnosis not present

## 2019-03-13 DIAGNOSIS — M6249 Contracture of muscle, multiple sites: Secondary | ICD-10-CM | POA: Diagnosis not present

## 2019-03-14 DIAGNOSIS — E46 Unspecified protein-calorie malnutrition: Secondary | ICD-10-CM | POA: Diagnosis not present

## 2019-03-14 DIAGNOSIS — L03116 Cellulitis of left lower limb: Secondary | ICD-10-CM | POA: Diagnosis not present

## 2019-03-14 DIAGNOSIS — F0390 Unspecified dementia without behavioral disturbance: Secondary | ICD-10-CM | POA: Diagnosis not present

## 2019-03-14 DIAGNOSIS — M6281 Muscle weakness (generalized): Secondary | ICD-10-CM | POA: Diagnosis not present

## 2019-03-14 DIAGNOSIS — M6249 Contracture of muscle, multiple sites: Secondary | ICD-10-CM | POA: Diagnosis not present

## 2019-03-14 DIAGNOSIS — I69828 Other speech and language deficits following other cerebrovascular disease: Secondary | ICD-10-CM | POA: Diagnosis not present

## 2019-03-15 ENCOUNTER — Non-Acute Institutional Stay (SKILLED_NURSING_FACILITY): Payer: Medicare Other | Admitting: Internal Medicine

## 2019-03-15 ENCOUNTER — Encounter: Payer: Self-pay | Admitting: Internal Medicine

## 2019-03-15 DIAGNOSIS — U071 COVID-19: Secondary | ICD-10-CM | POA: Diagnosis not present

## 2019-03-15 DIAGNOSIS — F0390 Unspecified dementia without behavioral disturbance: Secondary | ICD-10-CM | POA: Diagnosis not present

## 2019-03-15 DIAGNOSIS — I5032 Chronic diastolic (congestive) heart failure: Secondary | ICD-10-CM

## 2019-03-15 DIAGNOSIS — I471 Supraventricular tachycardia: Secondary | ICD-10-CM | POA: Diagnosis not present

## 2019-03-15 DIAGNOSIS — N183 Chronic kidney disease, stage 3 unspecified: Secondary | ICD-10-CM

## 2019-03-15 DIAGNOSIS — R627 Adult failure to thrive: Secondary | ICD-10-CM | POA: Diagnosis not present

## 2019-03-15 DIAGNOSIS — I69828 Other speech and language deficits following other cerebrovascular disease: Secondary | ICD-10-CM | POA: Diagnosis not present

## 2019-03-15 DIAGNOSIS — M6281 Muscle weakness (generalized): Secondary | ICD-10-CM | POA: Diagnosis not present

## 2019-03-15 DIAGNOSIS — M6249 Contracture of muscle, multiple sites: Secondary | ICD-10-CM | POA: Diagnosis not present

## 2019-03-15 DIAGNOSIS — L03116 Cellulitis of left lower limb: Secondary | ICD-10-CM | POA: Diagnosis not present

## 2019-03-15 DIAGNOSIS — E46 Unspecified protein-calorie malnutrition: Secondary | ICD-10-CM | POA: Diagnosis not present

## 2019-03-15 NOTE — Progress Notes (Signed)
[facility] Room Number: 205-D Place of Service:  SNF [id number](31) Provider:  Edmon CrapeLassen, Lillar Bianca, PA-C  Patient Care Team: Margit HanksAlexander, Anne D, MD as PCP - General (Internal Medicine)  Extended Emergency Contact Information Primary Emergency Contact: McNeely,Richard Address: [address]          [place], [place] [id number] [place] Home Phone: [phone] Relation: Brother  Code Status:  DNR Goals of care: Advanced Directive information Advanced Directives 02/22/2019  Does Patient Have a Medical Advance Directive? Yes  Type of Advance Directive Out of facility DNR (pink MOST or yellow form)  Does patient want to make changes to medical advance directive? No - Guardian declined  Pre-existing out of facility DNR order (yellow form or pink MOST form) Yellow form placed in chart (order not valid for inpatient use)     Chief Complaint  Patient presents with  . Medical Management of Chronic Issues    Routine [facility] visit  Medical management of chronic medical conditions including dementia-failure to thrive-diastolic CHF-hypertension- atrial fibrillation-reactive R wave disease-lower extremity edema- recent history of pneumonia-UTI and COVID positive.    HPI:  Pt is a 85 y.o. female seen today for medical management of chronic diseases.  As noted above.  Most recent acute issue was a COVID positive test in July complicated with diagnosis of pneumonia and UTI Proteus-she was treated with Rocephin and Zithromax for the pneumonia-she also received Decadron secondary to the COVID positive test.  She completed an extended course in the isolation unit here- initially did not do very well but she did rebound she did receive clysis and turned the corner it appears after an extended course.  She is now back into her regular room.  She appears to be back to her baseline- it appears she lost about 4 pounds since April-although there is been  some variable weights she is on supplements including Ensure.  In regards to diastolic CHF her Lasix was held secondary to her failure to thrive issues as well as a mildly elevated sodium- edema does not appear to be significant today she does have some pedal edema but it does not really extend much into her legs.  Nursing does not report any increased shortness of breath or chest pain complaints.  She does have a history of reactive airway disease but appears to be doing well at this point in that regards.  Regards to hypertension recent blood pressures are 120/68-154/86 104/76 I do not see consistent highs or lows- she is on Toprol-XL 25 mg a day she is also on this for history of A. fib.  She is not on any anticoagulation because of her comorbidities and advanced age.  She also has a listed history stage II ulcer to her right leg this is currently covered by wound care and thought to be stable she also has a stage I left heel ulcer which is currently covered.  Currently she is lying in bed comfortably she is alert but confused-she is cooperative with exam has some difficulty following verbal commands vital signs again appear to be stable she is afebrile   Past Medical History:  Diagnosis Date  . Acne rosacea   . CKD (chronic kidney disease), stage III (HCC)   . Depression   . Hypertension   . Osteopenia   . PUD (peptic ulcer disease)   . Restrictive lung disease   . Shingles   . Tinnitus    and  vertigo   History reviewed. No pertinent surgical history.  No Known Allergies  Outpatient Encounter Medications as of 03/15/2019  Medication Sig  . Ascorbic Acid (VITAMIN C) 1000 MG tablet Take 1,000 mg by mouth daily.  . cetaphil (CETAPHIL) lotion Apply 1 application topically at bedtime. Apply to bilateral lower extremities  . Cholecalciferol (VITAMIN D3) 1.25 MG (50000 UT) CAPS Take 1 capsule by mouth once a week.  . clotrimazole-betamethasone (LOTRISONE) cream Apply 1 application  topically daily as needed (LLE rash).   . Ensure (ENSURE) Take 237 mLs by mouth 3 (three) times daily.  . metoprolol succinate (TOPROL-XL) 25 MG 24 hr tablet Take 25 mg by mouth daily.   . polyethylene glycol (MIRALAX / GLYCOLAX) packet Take 17 g by mouth daily.  . potassium chloride (K-DUR,KLOR-CON) 10 MEQ tablet Take 10 mEq by mouth every other day.   . [DISCONTINUED] Nutritional Supplement LIQD Take 120 mLs by mouth 3 (three) times daily. MedPass  . [DISCONTINUED] sodium chloride 0.45 % solution Inject 60 mLs into the vein continuous. 60 mL/hour via clysis   No facility-administered encounter medications on file as of 03/15/2019.     Review of Systems   This is not really unobtainable secondary to dementia please see HPI nursing does not report any recent acute issues  Immunization History  Administered Date(s) Administered  . Influenza, High Dose Seasonal PF 03/31/2017  . Influenza-Unspecified 03/17/2018   Pertinent  Health Maintenance Due  Topic Date Due  . INFLUENZA VACCINE  01/30/2019  . DEXA SCAN  Discontinued  . PNA vac Low Risk Adult  Discontinued   No flowsheet data found. Functional Status Survey:    Vitals:   03/15/19 1023  BP: 120/68  Pulse: 85  Resp: 18  Temp: (!) 97.2 F (36.2 C)  TempSrc: Oral  Weight: 98 lb 9.6 oz (44.7 kg)  Height: 5\' 4"  (1.626 m)  I see 2 recent listed weights once  is 98.6 the other 106.5- appears earlier this month her weight was around 107- will await updated weights Body mass index is 16.92 kg/m. Physical Exam  General this is a somewhat frail feeling elderly female in no distress lying comfortably in bed she is confused but pleasantly so.  Her skin is warm and dry-left heel ulcer and right lower leg wound is currently covered again this is followed by wound care.  Eyes visual acuity appears to be intact sclera and conjunctive are clear.  Oropharynx appears to be clear limited exam because she did not really open her mouth very  wide.  Chest is clear to auscultation with somewhat shallow air entry respiratory effort was somewhat poor.  Heart was largely regular rate and rhythm with a few irregular beats- she has mild pedal edema this is more so on the right pedal pulses intact -- edema that is present is nonerythematous nontender  Musculoskeletal does hold her right lower extremity in a contracted position does have general frailty appears to move her upper extremities at baseline with general frailty.  Neurologic is grossly intact she does speak but continues to be confused cranial nerves appear to be intact could not really appreciate lateralizing findings.  Psych she is oriented to self appears to attempt to follow some verbal commands-she is not agitated with exam   Labs reviewed: Recent Labs    12/15/18 01/14/19  NA 146 140  K 3.9 4.0  BUN 22* 19  CREATININE 1.1 0.9   No results for input(s): AST, ALT, ALKPHOS, BILITOT, PROT,  ALBUMIN in the last 8760 hours. Recent Labs    01/13/19  WBC 6.9  NEUTROABS 5  HGB 15.1  HCT 45  PLT 129*   Lab Results  Component Value Date   TSH 4.553 (H) 01/30/2018   No results found for: HGBA1C Lab Results  Component Value Date   CHOL 167 08/15/2015   HDL 65 08/15/2015   LDLCALC 87 08/15/2015   TRIG 76 08/15/2015   CHOLHDL 2.6 08/15/2015    Significant Diagnostic Results in last 30 days:  No results found.  Assessment/Plan  #1 history of: COVID positive test complicated with pneumonia and UTI- he actually appears to have made a nice recovery from this after initially not doing so well- she did respond well to clysis-she also was treated with Decadron as well.  Azithromycin and Rocephin.  She appears to be closer to her baseline vital signs appear to be stable she is afebrile at this point continue to monitor and continue supportive care.  2.  Failure to thrive she has some variable weights will await updated weight she is on Ensure supplementation 3 times  daily staff is encouraging p.o. intake- suspect there will be some challenges with her advanced age.  At this point continue supportive care and monitoring of weights.  3.-  History of diastolic CHF-she does not really have significant edema just mild pedal edema more so on the left she is no longer on Lasix which was discontinued during her acute episode back in July-she is on low-dose potassium and will update a metabolic panel to ensure stability of electrolytes and renal function.  4.  History of chronic kidney disease again creatinine was 0.9 BUN 19 back on July lab will have this updated.  5.  Atrial fibrillation she is now on her anticoagulation because of her advanced age she is on Toprol for rate control this appears stable I got a pulse in the 80s today previous levels appear to be more in the 70s to 80s.  6.  History of reactive airway disease- she is not really on any medication this appears stable nursing does not report any recent respiratory issues shortness of breath cough etc.  7.  History of left heel wound and right lower extremity wound this is followed closely by wound care To be stable.  8 vitamin D deficiency vitamin D level was 20.77 on July lab she is on supplementation this will need to be monitored with updated labs at times   [id number]

## 2019-03-16 DIAGNOSIS — M6249 Contracture of muscle, multiple sites: Secondary | ICD-10-CM | POA: Diagnosis not present

## 2019-03-16 DIAGNOSIS — F0390 Unspecified dementia without behavioral disturbance: Secondary | ICD-10-CM | POA: Diagnosis not present

## 2019-03-16 DIAGNOSIS — D649 Anemia, unspecified: Secondary | ICD-10-CM | POA: Diagnosis not present

## 2019-03-16 DIAGNOSIS — I69828 Other speech and language deficits following other cerebrovascular disease: Secondary | ICD-10-CM | POA: Diagnosis not present

## 2019-03-16 DIAGNOSIS — M6281 Muscle weakness (generalized): Secondary | ICD-10-CM | POA: Diagnosis not present

## 2019-03-16 DIAGNOSIS — E46 Unspecified protein-calorie malnutrition: Secondary | ICD-10-CM | POA: Diagnosis not present

## 2019-03-16 DIAGNOSIS — E559 Vitamin D deficiency, unspecified: Secondary | ICD-10-CM | POA: Diagnosis not present

## 2019-03-16 DIAGNOSIS — L03116 Cellulitis of left lower limb: Secondary | ICD-10-CM | POA: Diagnosis not present

## 2019-03-16 DIAGNOSIS — I10 Essential (primary) hypertension: Secondary | ICD-10-CM | POA: Diagnosis not present

## 2019-03-16 LAB — BASIC METABOLIC PANEL
BUN: 12 (ref 4–21)
Creatinine: 0.6 (ref 0.5–1.1)
Glucose: 85
Potassium: 4 (ref 3.4–5.3)
Sodium: 141 (ref 137–147)

## 2019-03-16 LAB — VITAMIN D 25 HYDROXY (VIT D DEFICIENCY, FRACTURES): Vit D, 25-Hydroxy: 24.68

## 2019-03-17 ENCOUNTER — Non-Acute Institutional Stay (SKILLED_NURSING_FACILITY): Payer: Medicare Other | Admitting: Internal Medicine

## 2019-03-17 DIAGNOSIS — E559 Vitamin D deficiency, unspecified: Secondary | ICD-10-CM | POA: Diagnosis not present

## 2019-03-17 DIAGNOSIS — E870 Hyperosmolality and hypernatremia: Secondary | ICD-10-CM

## 2019-03-17 DIAGNOSIS — I69828 Other speech and language deficits following other cerebrovascular disease: Secondary | ICD-10-CM | POA: Diagnosis not present

## 2019-03-17 DIAGNOSIS — L03116 Cellulitis of left lower limb: Secondary | ICD-10-CM | POA: Diagnosis not present

## 2019-03-17 DIAGNOSIS — M6249 Contracture of muscle, multiple sites: Secondary | ICD-10-CM | POA: Diagnosis not present

## 2019-03-17 DIAGNOSIS — M6281 Muscle weakness (generalized): Secondary | ICD-10-CM | POA: Diagnosis not present

## 2019-03-17 DIAGNOSIS — F0390 Unspecified dementia without behavioral disturbance: Secondary | ICD-10-CM | POA: Diagnosis not present

## 2019-03-17 DIAGNOSIS — E46 Unspecified protein-calorie malnutrition: Secondary | ICD-10-CM | POA: Diagnosis not present

## 2019-03-17 NOTE — Progress Notes (Signed)
\This is an acute visit.  Level care skilled.  Facility is [facility].  Chief complaint acute visit follow-up labs including metabolic panel with history of hypernatremia as well as vitamin D deficiency.  History of present illness.  Patient is a 85 year old female who is a long-term resident of the facility  She has a history of dementia as well as failure to thrive diastolic CHF hypertension atrial fibrillation as well as chronic lower extremity edema recent history of pneumonia and UTI in cold but positive test.  She appears to have recovered well from her recent respiratory issues I saw her recently for routine visit and ordered labs including a vitamin D level since she appears to also have a vitamin D deficiency.  Labs today show some improvement at 24.68 that was 20.77 back in June.  She is on vitamin D 50,000 units q. weekly.  We also checked a metabolic panel her sodium is 141 which shows stability it was as high as 146 back in July.  Currently she is resting in bed comfortably nursing does not report any acute concerns-she continues to have somewhat of a spotty appetite.  Past Medical History:  Diagnosis Date  . Acne rosacea   . CKD (chronic kidney disease), stage III (HCC)   . Depression   . Hypertension   . Osteopenia   . PUD (peptic ulcer disease)   . Restrictive lung disease   . Shingles   . Tinnitus    and vertigo   History reviewed. No pertinent surgical history.  No Known Allergies      MEDICATIONS   Medication Sig  . Ascorbic Acid (VITAMIN C) 1000 MG tablet Take 1,000 mg by mouth daily.  . cetaphil (CETAPHIL) lotion Apply 1 application topically at bedtime. Apply to bilateral lower extremities  . Cholecalciferol (VITAMIN D3) 1.25 MG (50000 UT) CAPS Take 1 capsule by mouth once a week.  . clotrimazole-betamethasone (LOTRISONE) cream Apply 1 application topically daily as needed (LLE rash).   . Ensure (ENSURE) Take 237 mLs by mouth 3  (three) times daily.  . metoprolol succinate (TOPROL-XL) 25 MG 24 hr tablet Take 25 mg by mouth daily.   . polyethylene glycol (MIRALAX / GLYCOLAX) packet Take 17 g by mouth daily.  . potassium chloride (K-DUR,KLOR-CON) 10 MEQ tablet Take 10 mEq by mouth every other day.   . [DISCONTINUED] Nutritional Supplement LIQD Take 120 mLs by mouth 3 (three) times daily. MedPass  . [DISCONTINUED] sodium chloride 0.45 % solution Inject 60 mLs into the vein continuous. 60 mL/hour via clysis   Review of systems.  This is largely unobtainable secondary to dementia.  Physical exam.  Temperature is 97.1 pulse 90 respirations 18 blood pressure 128/79.  In general this is a pleasant somewhat frail-appearing elderly female in no distress lying comfortably in bed.  Her skin is warm and dry she does have a left heel ulcer currently covered in her right lower leg wound is well that is covered this is followed by wound care.  Chest is clear to auscultation with poor respiratory effort there is no labored breathing.  Heart is regular rate and rhythm with baseline 6 occasional irregular beats.  Musculoskeletal continues to hold her right lower extremity in a somewhat contracted position has general frailty but moves her upper extremities it appears at her relative baseline.  Neurologic is grossly intact she speaks but somewhat nonsensically cranial nerves appear to be intact.  Psych findings consistent with dementia she is pleasant has difficulty  following verbal commands.  Labs.  January 13, 2019.  Sodium 141 potassium 4 BUN 11.8 creatinine 0.59.  Vitamin D level was 24.68  Assessment plan.  1.  History of vitamin D deficiency vitamin D is gradually trending up it appears supplementation was started back in late July will update level in about 4 weeks it appears to be rising  #2 history of hypernatremia sodium has shown stability at 141 BUN is 0.59 creatinine around 12 at this point continue to  encourage fluids and monitor.  3.  History of chronic kidney disease again this shows stability with a creatinine of 0.59 BUN of 11.8  [id number]

## 2019-03-18 ENCOUNTER — Non-Acute Institutional Stay (SKILLED_NURSING_FACILITY): Payer: Medicare Other | Admitting: Internal Medicine

## 2019-03-18 DIAGNOSIS — L03116 Cellulitis of left lower limb: Secondary | ICD-10-CM | POA: Diagnosis not present

## 2019-03-18 DIAGNOSIS — R600 Localized edema: Secondary | ICD-10-CM | POA: Diagnosis not present

## 2019-03-18 DIAGNOSIS — E46 Unspecified protein-calorie malnutrition: Secondary | ICD-10-CM | POA: Diagnosis not present

## 2019-03-18 DIAGNOSIS — F0390 Unspecified dementia without behavioral disturbance: Secondary | ICD-10-CM | POA: Diagnosis not present

## 2019-03-18 DIAGNOSIS — I69828 Other speech and language deficits following other cerebrovascular disease: Secondary | ICD-10-CM | POA: Diagnosis not present

## 2019-03-18 DIAGNOSIS — M6281 Muscle weakness (generalized): Secondary | ICD-10-CM | POA: Diagnosis not present

## 2019-03-18 DIAGNOSIS — M6249 Contracture of muscle, multiple sites: Secondary | ICD-10-CM | POA: Diagnosis not present

## 2019-03-19 ENCOUNTER — Encounter: Payer: Self-pay | Admitting: Internal Medicine

## 2019-03-19 DIAGNOSIS — M6281 Muscle weakness (generalized): Secondary | ICD-10-CM | POA: Diagnosis not present

## 2019-03-19 DIAGNOSIS — F0390 Unspecified dementia without behavioral disturbance: Secondary | ICD-10-CM | POA: Diagnosis not present

## 2019-03-19 DIAGNOSIS — E559 Vitamin D deficiency, unspecified: Secondary | ICD-10-CM | POA: Insufficient documentation

## 2019-03-19 DIAGNOSIS — E46 Unspecified protein-calorie malnutrition: Secondary | ICD-10-CM | POA: Diagnosis not present

## 2019-03-19 DIAGNOSIS — L03116 Cellulitis of left lower limb: Secondary | ICD-10-CM | POA: Diagnosis not present

## 2019-03-19 DIAGNOSIS — M6249 Contracture of muscle, multiple sites: Secondary | ICD-10-CM | POA: Diagnosis not present

## 2019-03-19 DIAGNOSIS — I69828 Other speech and language deficits following other cerebrovascular disease: Secondary | ICD-10-CM | POA: Diagnosis not present

## 2019-03-19 NOTE — Progress Notes (Signed)
This is an acute visit.  Level of care skilled.  Facility is [facility].  Chief complaint-acute visit follow-up right lower extremity edema.  History of present illness.  Patient is a pleasant 85 year old female who is a long-term resident of the facility-she has numerous diagnoses including a history of dementia-failure to thrive-atrial fibrillation as well as a history of diastolic CHF hypertension and chronic lower extremity edema and a recent history of pneumonia UTI in COVID positivity which she has made a nice recovery from it appears.  She does have some history of diastolic CHF at one point been on Lasix but did have issues with elevated sodium and this was discontinued nonetheless the edema appears to have stabilized.  Nursing staff wanted me to look at her right foot and leg- saying it has some edema.  However on evaluation appears this is not really new it is cool to touch nonerythematous nontender the area does not appear to be warm I believe this is more dependent edema.  I checked with staff about accurate weights and appears her weight is fairly stable at around 106 pounds without any precipitous weight gain.  At this point I would suspect this is dependent edema and I did discuss this with nursing- and they feel this is probably the case as well but will monitor for any changes that might be concerning  Past Medical History:  Diagnosis Date  . Acne rosacea   . CKD (chronic kidney disease), stage III (HCC)   . Depression   . Hypertension   . Osteopenia   . PUD (peptic ulcer disease)   . Restrictive lung disease   . Shingles   . Tinnitus    and vertigo   History reviewed. No pertinent surgical history.  No Known Allergies      MEDICATIONS    Medication Sig  . Ascorbic Acid (VITAMIN C) 1000 MG tablet Take 1,000 mg by mouth daily.  . cetaphil (CETAPHIL) lotion Apply 1 application topically at bedtime. Apply to bilateral lower extremities  .  Cholecalciferol (VITAMIN D3) 1.25 MG (50000 UT) CAPS Take 1 capsule by mouth once a week.  . clotrimazole-betamethasone (LOTRISONE) cream Apply 1 application topically daily as needed (LLE rash).   . Ensure (ENSURE) Take 237 mLs by mouth 3 (three) times daily.  . metoprolol succinate (TOPROL-XL) 25 MG 24 hr tablet Take 25 mg by mouth daily.   . polyethylene glycol (MIRALAX / GLYCOLAX) packet Take 17 g by mouth daily.  . potassium chloride (K-DUR,KLOR-CON) 10 MEQ tablet Take 10 mEq by mouth every other day.   . [DISCONTINUED] Nutritional Supplement LIQD Take 120 mLs by mouth 3 (three) times daily. MedPass  . [DISCONTINUED] sodium chloride 0.45 % solution Inject 60 mLs into the vein continuous. 60 mL/hour via clysis   No facility-administered encounter medications on file as of 03/15/2019.     Review of Systems   This is not really unobtainable secondary to dementia please see HPI nursing does not report any recent acute issues      Immunization History  Administered Date(s) Administered  . Influenza, High Dose Seasonal PF 03/31/2017  . Influenza-Unspecified 03/17/2018       Pertinent  Health Maintenance Due  Topic Date Due  . INFLUENZA VACCINE  01/30/2019  . DEXA SCAN  Discontinued  . PNA vac Low Risk Adult  Discontinued   No flowsheet data found. Functional Status Survey:    Physical exam-temperature is 97.2 pulse 74 respirations 20 blood pressure 116/71.  In general this is a pleasant Madeline Parrish confused elderly female in no distress lying comfortably in bed.  Her skin is warm and dry.  Chest is clear to auscultation with poor respiratory effort she does not really follow verbal commands well but no sign of labored breathing or congestion.  Heart is largely regular rate and rhythm with some occasional beats.  Musculoskeletal she does have some pedal edema on the right foot pedal pulse is palpable but somewhat reduced I suspect secondary to the edema which is cool  nonerythematous and nontender-- there is some mild edema in her lower right leg but this again is cool nonerythematous nontender She does hold her right leg in a somewhat contracted position.  I did not really notice significant edema on the left leg.  Labs.  March 16, 2019.  Sodium 141 potassium 4 BUN 11.8 creatinine 0.59.  January 13, 2019.  WBC 6.9 hemoglobin 15.1 platelets 129.  Assessment and plan.  1.  Right leg pedal and mild leg edema-I suspect this is more dependent edema it is nonerythematous nontender and cool to touch- I have discussed this with staff and as noted above they will monitor for any changes including increased pain changing color change and warmth anything that would indicate that this is progressing including any increase in the edema as well.  [id number]

## 2019-03-22 DIAGNOSIS — I69828 Other speech and language deficits following other cerebrovascular disease: Secondary | ICD-10-CM | POA: Diagnosis not present

## 2019-03-22 DIAGNOSIS — M6249 Contracture of muscle, multiple sites: Secondary | ICD-10-CM | POA: Diagnosis not present

## 2019-03-22 DIAGNOSIS — L03116 Cellulitis of left lower limb: Secondary | ICD-10-CM | POA: Diagnosis not present

## 2019-03-22 DIAGNOSIS — F0390 Unspecified dementia without behavioral disturbance: Secondary | ICD-10-CM | POA: Diagnosis not present

## 2019-03-22 DIAGNOSIS — E46 Unspecified protein-calorie malnutrition: Secondary | ICD-10-CM | POA: Diagnosis not present

## 2019-03-22 DIAGNOSIS — M6281 Muscle weakness (generalized): Secondary | ICD-10-CM | POA: Diagnosis not present

## 2019-03-23 DIAGNOSIS — M6249 Contracture of muscle, multiple sites: Secondary | ICD-10-CM | POA: Diagnosis not present

## 2019-03-23 DIAGNOSIS — E46 Unspecified protein-calorie malnutrition: Secondary | ICD-10-CM | POA: Diagnosis not present

## 2019-03-23 DIAGNOSIS — E559 Vitamin D deficiency, unspecified: Secondary | ICD-10-CM | POA: Diagnosis not present

## 2019-03-23 DIAGNOSIS — M6281 Muscle weakness (generalized): Secondary | ICD-10-CM | POA: Diagnosis not present

## 2019-03-23 DIAGNOSIS — I69828 Other speech and language deficits following other cerebrovascular disease: Secondary | ICD-10-CM | POA: Diagnosis not present

## 2019-03-23 DIAGNOSIS — L03116 Cellulitis of left lower limb: Secondary | ICD-10-CM | POA: Diagnosis not present

## 2019-03-23 DIAGNOSIS — F0390 Unspecified dementia without behavioral disturbance: Secondary | ICD-10-CM | POA: Diagnosis not present

## 2019-03-24 DIAGNOSIS — M6249 Contracture of muscle, multiple sites: Secondary | ICD-10-CM | POA: Diagnosis not present

## 2019-03-24 DIAGNOSIS — I69828 Other speech and language deficits following other cerebrovascular disease: Secondary | ICD-10-CM | POA: Diagnosis not present

## 2019-03-24 DIAGNOSIS — F0390 Unspecified dementia without behavioral disturbance: Secondary | ICD-10-CM | POA: Diagnosis not present

## 2019-03-24 DIAGNOSIS — M6281 Muscle weakness (generalized): Secondary | ICD-10-CM | POA: Diagnosis not present

## 2019-03-24 DIAGNOSIS — E46 Unspecified protein-calorie malnutrition: Secondary | ICD-10-CM | POA: Diagnosis not present

## 2019-03-24 DIAGNOSIS — L03116 Cellulitis of left lower limb: Secondary | ICD-10-CM | POA: Diagnosis not present

## 2019-03-25 DIAGNOSIS — M6249 Contracture of muscle, multiple sites: Secondary | ICD-10-CM | POA: Diagnosis not present

## 2019-03-25 DIAGNOSIS — F0390 Unspecified dementia without behavioral disturbance: Secondary | ICD-10-CM | POA: Diagnosis not present

## 2019-03-25 DIAGNOSIS — E46 Unspecified protein-calorie malnutrition: Secondary | ICD-10-CM | POA: Diagnosis not present

## 2019-03-25 DIAGNOSIS — I69828 Other speech and language deficits following other cerebrovascular disease: Secondary | ICD-10-CM | POA: Diagnosis not present

## 2019-03-25 DIAGNOSIS — M6281 Muscle weakness (generalized): Secondary | ICD-10-CM | POA: Diagnosis not present

## 2019-03-25 DIAGNOSIS — L03116 Cellulitis of left lower limb: Secondary | ICD-10-CM | POA: Diagnosis not present

## 2019-03-26 DIAGNOSIS — M6281 Muscle weakness (generalized): Secondary | ICD-10-CM | POA: Diagnosis not present

## 2019-03-26 DIAGNOSIS — M6249 Contracture of muscle, multiple sites: Secondary | ICD-10-CM | POA: Diagnosis not present

## 2019-03-26 DIAGNOSIS — I69828 Other speech and language deficits following other cerebrovascular disease: Secondary | ICD-10-CM | POA: Diagnosis not present

## 2019-03-26 DIAGNOSIS — E46 Unspecified protein-calorie malnutrition: Secondary | ICD-10-CM | POA: Diagnosis not present

## 2019-03-26 DIAGNOSIS — F0390 Unspecified dementia without behavioral disturbance: Secondary | ICD-10-CM | POA: Diagnosis not present

## 2019-03-26 DIAGNOSIS — L03116 Cellulitis of left lower limb: Secondary | ICD-10-CM | POA: Diagnosis not present

## 2019-03-27 DIAGNOSIS — E46 Unspecified protein-calorie malnutrition: Secondary | ICD-10-CM | POA: Diagnosis not present

## 2019-03-27 DIAGNOSIS — I69828 Other speech and language deficits following other cerebrovascular disease: Secondary | ICD-10-CM | POA: Diagnosis not present

## 2019-03-27 DIAGNOSIS — L03116 Cellulitis of left lower limb: Secondary | ICD-10-CM | POA: Diagnosis not present

## 2019-03-27 DIAGNOSIS — F0390 Unspecified dementia without behavioral disturbance: Secondary | ICD-10-CM | POA: Diagnosis not present

## 2019-03-27 DIAGNOSIS — M6249 Contracture of muscle, multiple sites: Secondary | ICD-10-CM | POA: Diagnosis not present

## 2019-03-27 DIAGNOSIS — M6281 Muscle weakness (generalized): Secondary | ICD-10-CM | POA: Diagnosis not present

## 2019-03-29 DIAGNOSIS — L03116 Cellulitis of left lower limb: Secondary | ICD-10-CM | POA: Diagnosis not present

## 2019-03-29 DIAGNOSIS — F0390 Unspecified dementia without behavioral disturbance: Secondary | ICD-10-CM | POA: Diagnosis not present

## 2019-03-29 DIAGNOSIS — I69828 Other speech and language deficits following other cerebrovascular disease: Secondary | ICD-10-CM | POA: Diagnosis not present

## 2019-03-29 DIAGNOSIS — M6249 Contracture of muscle, multiple sites: Secondary | ICD-10-CM | POA: Diagnosis not present

## 2019-03-29 DIAGNOSIS — M6281 Muscle weakness (generalized): Secondary | ICD-10-CM | POA: Diagnosis not present

## 2019-03-29 DIAGNOSIS — E46 Unspecified protein-calorie malnutrition: Secondary | ICD-10-CM | POA: Diagnosis not present

## 2019-03-29 LAB — VITAMIN D 25 HYDROXY (VIT D DEFICIENCY, FRACTURES): Vit D, 25-Hydroxy: 28.8

## 2019-03-30 DIAGNOSIS — M6281 Muscle weakness (generalized): Secondary | ICD-10-CM | POA: Diagnosis not present

## 2019-03-30 DIAGNOSIS — M6249 Contracture of muscle, multiple sites: Secondary | ICD-10-CM | POA: Diagnosis not present

## 2019-03-30 DIAGNOSIS — I69828 Other speech and language deficits following other cerebrovascular disease: Secondary | ICD-10-CM | POA: Diagnosis not present

## 2019-03-30 DIAGNOSIS — L03116 Cellulitis of left lower limb: Secondary | ICD-10-CM | POA: Diagnosis not present

## 2019-03-30 DIAGNOSIS — F0390 Unspecified dementia without behavioral disturbance: Secondary | ICD-10-CM | POA: Diagnosis not present

## 2019-03-30 DIAGNOSIS — E46 Unspecified protein-calorie malnutrition: Secondary | ICD-10-CM | POA: Diagnosis not present

## 2019-03-31 DIAGNOSIS — F0390 Unspecified dementia without behavioral disturbance: Secondary | ICD-10-CM | POA: Diagnosis not present

## 2019-03-31 DIAGNOSIS — L03116 Cellulitis of left lower limb: Secondary | ICD-10-CM | POA: Diagnosis not present

## 2019-03-31 DIAGNOSIS — I69828 Other speech and language deficits following other cerebrovascular disease: Secondary | ICD-10-CM | POA: Diagnosis not present

## 2019-03-31 DIAGNOSIS — E46 Unspecified protein-calorie malnutrition: Secondary | ICD-10-CM | POA: Diagnosis not present

## 2019-03-31 DIAGNOSIS — M6281 Muscle weakness (generalized): Secondary | ICD-10-CM | POA: Diagnosis not present

## 2019-03-31 DIAGNOSIS — M6249 Contracture of muscle, multiple sites: Secondary | ICD-10-CM | POA: Diagnosis not present

## 2019-04-01 DIAGNOSIS — E46 Unspecified protein-calorie malnutrition: Secondary | ICD-10-CM | POA: Diagnosis not present

## 2019-04-01 DIAGNOSIS — F0390 Unspecified dementia without behavioral disturbance: Secondary | ICD-10-CM | POA: Diagnosis not present

## 2019-04-01 DIAGNOSIS — M6281 Muscle weakness (generalized): Secondary | ICD-10-CM | POA: Diagnosis not present

## 2019-04-01 DIAGNOSIS — M6249 Contracture of muscle, multiple sites: Secondary | ICD-10-CM | POA: Diagnosis not present

## 2019-04-02 DIAGNOSIS — M6249 Contracture of muscle, multiple sites: Secondary | ICD-10-CM | POA: Diagnosis not present

## 2019-04-02 DIAGNOSIS — E46 Unspecified protein-calorie malnutrition: Secondary | ICD-10-CM | POA: Diagnosis not present

## 2019-04-02 DIAGNOSIS — F0390 Unspecified dementia without behavioral disturbance: Secondary | ICD-10-CM | POA: Diagnosis not present

## 2019-04-02 DIAGNOSIS — M6281 Muscle weakness (generalized): Secondary | ICD-10-CM | POA: Diagnosis not present

## 2019-04-03 DIAGNOSIS — E46 Unspecified protein-calorie malnutrition: Secondary | ICD-10-CM | POA: Diagnosis not present

## 2019-04-03 DIAGNOSIS — M6281 Muscle weakness (generalized): Secondary | ICD-10-CM | POA: Diagnosis not present

## 2019-04-03 DIAGNOSIS — M6249 Contracture of muscle, multiple sites: Secondary | ICD-10-CM | POA: Diagnosis not present

## 2019-04-03 DIAGNOSIS — F0390 Unspecified dementia without behavioral disturbance: Secondary | ICD-10-CM | POA: Diagnosis not present

## 2019-04-04 ENCOUNTER — Inpatient Hospital Stay (HOSPITAL_COMMUNITY)
Admission: EM | Admit: 2019-04-04 | Discharge: 2019-04-06 | DRG: 175 | Disposition: A | Discharge status: Skilled Nursing Facility | Payer: Medicare Other | Source: Skilled Nursing Facility | Attending: Internal Medicine | Admitting: Internal Medicine

## 2019-04-04 ENCOUNTER — Encounter (HOSPITAL_COMMUNITY): Payer: Self-pay | Admitting: Family Medicine

## 2019-04-04 ENCOUNTER — Other Ambulatory Visit: Payer: Self-pay

## 2019-04-04 ENCOUNTER — Emergency Department (HOSPITAL_COMMUNITY): Payer: Medicare Other

## 2019-04-04 DIAGNOSIS — M6281 Muscle weakness (generalized): Secondary | ICD-10-CM | POA: Diagnosis not present

## 2019-04-04 DIAGNOSIS — M2550 Pain in unspecified joint: Secondary | ICD-10-CM | POA: Diagnosis not present

## 2019-04-04 DIAGNOSIS — Z66 Do not resuscitate: Secondary | ICD-10-CM | POA: Diagnosis present

## 2019-04-04 DIAGNOSIS — R64 Cachexia: Secondary | ICD-10-CM | POA: Diagnosis present

## 2019-04-04 DIAGNOSIS — M6282 Rhabdomyolysis: Secondary | ICD-10-CM | POA: Diagnosis not present

## 2019-04-04 DIAGNOSIS — F0390 Unspecified dementia without behavioral disturbance: Secondary | ICD-10-CM | POA: Diagnosis present

## 2019-04-04 DIAGNOSIS — I2699 Other pulmonary embolism without acute cor pulmonale: Principal | ICD-10-CM

## 2019-04-04 DIAGNOSIS — I509 Heart failure, unspecified: Secondary | ICD-10-CM | POA: Diagnosis not present

## 2019-04-04 DIAGNOSIS — R52 Pain, unspecified: Secondary | ICD-10-CM | POA: Diagnosis not present

## 2019-04-04 DIAGNOSIS — M2450 Contracture, unspecified joint: Secondary | ICD-10-CM | POA: Diagnosis present

## 2019-04-04 DIAGNOSIS — Z681 Body mass index (BMI) 19 or less, adult: Secondary | ICD-10-CM

## 2019-04-04 DIAGNOSIS — M6249 Contracture of muscle, multiple sites: Secondary | ICD-10-CM | POA: Diagnosis not present

## 2019-04-04 DIAGNOSIS — J9601 Acute respiratory failure with hypoxia: Secondary | ICD-10-CM | POA: Diagnosis not present

## 2019-04-04 DIAGNOSIS — R627 Adult failure to thrive: Secondary | ICD-10-CM | POA: Diagnosis present

## 2019-04-04 DIAGNOSIS — I471 Supraventricular tachycardia, unspecified: Secondary | ICD-10-CM | POA: Diagnosis present

## 2019-04-04 DIAGNOSIS — J1282 Pneumonia due to coronavirus disease 2019: Secondary | ICD-10-CM

## 2019-04-04 DIAGNOSIS — L8990 Pressure ulcer of unspecified site, unspecified stage: Secondary | ICD-10-CM | POA: Insufficient documentation

## 2019-04-04 DIAGNOSIS — E876 Hypokalemia: Secondary | ICD-10-CM | POA: Diagnosis not present

## 2019-04-04 DIAGNOSIS — N183 Chronic kidney disease, stage 3 unspecified: Secondary | ICD-10-CM

## 2019-04-04 DIAGNOSIS — Z823 Family history of stroke: Secondary | ICD-10-CM | POA: Diagnosis not present

## 2019-04-04 DIAGNOSIS — Z9181 History of falling: Secondary | ICD-10-CM | POA: Diagnosis not present

## 2019-04-04 DIAGNOSIS — R41841 Cognitive communication deficit: Secondary | ICD-10-CM | POA: Diagnosis not present

## 2019-04-04 DIAGNOSIS — I130 Hypertensive heart and chronic kidney disease with heart failure and stage 1 through stage 4 chronic kidney disease, or unspecified chronic kidney disease: Secondary | ICD-10-CM | POA: Diagnosis present

## 2019-04-04 DIAGNOSIS — U071 COVID-19: Secondary | ICD-10-CM

## 2019-04-04 DIAGNOSIS — R1312 Dysphagia, oropharyngeal phase: Secondary | ICD-10-CM | POA: Diagnosis not present

## 2019-04-04 DIAGNOSIS — Z20828 Contact with and (suspected) exposure to other viral communicable diseases: Secondary | ICD-10-CM | POA: Diagnosis not present

## 2019-04-04 DIAGNOSIS — I700 Atherosclerosis of aorta: Secondary | ICD-10-CM | POA: Diagnosis present

## 2019-04-04 DIAGNOSIS — R918 Other nonspecific abnormal finding of lung field: Secondary | ICD-10-CM | POA: Diagnosis present

## 2019-04-04 DIAGNOSIS — R0902 Hypoxemia: Secondary | ICD-10-CM | POA: Diagnosis not present

## 2019-04-04 DIAGNOSIS — I129 Hypertensive chronic kidney disease with stage 1 through stage 4 chronic kidney disease, or unspecified chronic kidney disease: Secondary | ICD-10-CM | POA: Diagnosis not present

## 2019-04-04 DIAGNOSIS — R0602 Shortness of breath: Secondary | ICD-10-CM | POA: Diagnosis not present

## 2019-04-04 DIAGNOSIS — Z8619 Personal history of other infectious and parasitic diseases: Secondary | ICD-10-CM | POA: Diagnosis not present

## 2019-04-04 DIAGNOSIS — J84112 Idiopathic pulmonary fibrosis: Secondary | ICD-10-CM | POA: Diagnosis present

## 2019-04-04 DIAGNOSIS — M8580 Other specified disorders of bone density and structure, unspecified site: Secondary | ICD-10-CM | POA: Diagnosis present

## 2019-04-04 DIAGNOSIS — Z7401 Bed confinement status: Secondary | ICD-10-CM | POA: Diagnosis not present

## 2019-04-04 DIAGNOSIS — R404 Transient alteration of awareness: Secondary | ICD-10-CM | POA: Diagnosis not present

## 2019-04-04 DIAGNOSIS — I69828 Other speech and language deficits following other cerebrovascular disease: Secondary | ICD-10-CM | POA: Diagnosis not present

## 2019-04-04 DIAGNOSIS — I5032 Chronic diastolic (congestive) heart failure: Secondary | ICD-10-CM | POA: Diagnosis present

## 2019-04-04 DIAGNOSIS — R5381 Other malaise: Secondary | ICD-10-CM | POA: Diagnosis not present

## 2019-04-04 DIAGNOSIS — I77810 Thoracic aortic ectasia: Secondary | ICD-10-CM | POA: Diagnosis present

## 2019-04-04 DIAGNOSIS — L03116 Cellulitis of left lower limb: Secondary | ICD-10-CM | POA: Diagnosis not present

## 2019-04-04 DIAGNOSIS — E43 Unspecified severe protein-calorie malnutrition: Secondary | ICD-10-CM | POA: Diagnosis not present

## 2019-04-04 HISTORY — DX: Other pulmonary embolism without acute cor pulmonale: I26.99

## 2019-04-04 LAB — CBC WITH DIFFERENTIAL/PLATELET
Abs Immature Granulocytes: 0.02 10*3/uL (ref 0.00–0.07)
Basophils Absolute: 0.1 10*3/uL (ref 0.0–0.1)
Basophils Relative: 1 %
Eosinophils Absolute: 0.1 10*3/uL (ref 0.0–0.5)
Eosinophils Relative: 1 %
HCT: 44.6 % (ref 36.0–46.0)
Hemoglobin: 13.7 g/dL (ref 12.0–15.0)
Immature Granulocytes: 0 %
Lymphocytes Relative: 18 %
Lymphs Abs: 1.5 10*3/uL (ref 0.7–4.0)
MCH: 30.9 pg (ref 26.0–34.0)
MCHC: 30.7 g/dL (ref 30.0–36.0)
MCV: 100.7 fL — ABNORMAL HIGH (ref 80.0–100.0)
Monocytes Absolute: 0.7 10*3/uL (ref 0.1–1.0)
Monocytes Relative: 9 %
Neutro Abs: 6 10*3/uL (ref 1.7–7.7)
Neutrophils Relative %: 71 %
Platelets: 407 10*3/uL — ABNORMAL HIGH (ref 150–400)
RBC: 4.43 MIL/uL (ref 3.87–5.11)
RDW: 17 % — ABNORMAL HIGH (ref 11.5–15.5)
WBC: 8.4 10*3/uL (ref 4.0–10.5)
nRBC: 0 % (ref 0.0–0.2)

## 2019-04-04 LAB — COMPREHENSIVE METABOLIC PANEL
ALT: 7 U/L (ref 0–44)
AST: 13 U/L — ABNORMAL LOW (ref 15–41)
Albumin: 2.4 g/dL — ABNORMAL LOW (ref 3.5–5.0)
Alkaline Phosphatase: 69 U/L (ref 38–126)
Anion gap: 9 (ref 5–15)
BUN: 21 mg/dL (ref 8–23)
CO2: 23 mmol/L (ref 22–32)
Calcium: 8.4 mg/dL — ABNORMAL LOW (ref 8.9–10.3)
Chloride: 113 mmol/L — ABNORMAL HIGH (ref 98–111)
Creatinine, Ser: 0.78 mg/dL (ref 0.44–1.00)
GFR calc Af Amer: >60 mL/min (ref >60)
GFR calc non Af Amer: >60 mL/min (ref >60)
Glucose, Bld: 94 mg/dL (ref 70–99)
Potassium: 4.3 mmol/L (ref 3.5–5.1)
Sodium: 145 mmol/L (ref 135–145)
Total Bilirubin: 1.2 mg/dL (ref 0.3–1.2)
Total Protein: 6.1 g/dL — ABNORMAL LOW (ref 6.5–8.1)

## 2019-04-04 LAB — D-DIMER, QUANTITATIVE: D-Dimer, Quant: 3.37 ug/mL-FEU — ABNORMAL HIGH (ref 0.00–0.50)

## 2019-04-04 LAB — SARS CORONAVIRUS 2 BY RT PCR (HOSPITAL ORDER, PERFORMED IN ~~LOC~~ HOSPITAL LAB): SARS Coronavirus 2: NEGATIVE

## 2019-04-04 LAB — BRAIN NATRIURETIC PEPTIDE: B Natriuretic Peptide: 183.9 pg/mL — ABNORMAL HIGH (ref 0.0–100.0)

## 2019-04-04 LAB — PROTIME-INR
INR: 1 (ref 0.8–1.2)
Prothrombin Time: 12.9 seconds (ref 11.4–15.2)

## 2019-04-04 LAB — LACTIC ACID, PLASMA
Lactic Acid, Venous: 2.1 mmol/L (ref 0.5–1.9)
Lactic Acid, Venous: 1.4 mmol/L (ref 0.5–1.9)

## 2019-04-04 LAB — PROCALCITONIN: Procalcitonin: 0.3 ng/mL

## 2019-04-04 LAB — APTT: aPTT: 25 seconds (ref 24–36)

## 2019-04-04 MED ORDER — SODIUM CHLORIDE 0.9 % IV SOLN
500.0000 mg | INTRAVENOUS | Status: DC
  Administered 2019-04-04 – 2019-04-05 (×2): 500 mg via INTRAVENOUS
  Filled 2019-04-04 (×3): qty 500

## 2019-04-04 MED ORDER — POLYETHYLENE GLYCOL 3350 17 G PO PACK
17.0000 g | PACK | Freq: Every day | ORAL | Status: DC
  Administered 2019-04-04 – 2019-04-06 (×3): 17 g via ORAL
  Filled 2019-04-04 (×3): qty 1

## 2019-04-04 MED ORDER — SODIUM CHLORIDE 0.9% FLUSH
3.0000 mL | Freq: Two times a day (BID) | INTRAVENOUS | Status: DC
  Administered 2019-04-04 – 2019-04-05 (×3): 3 mL via INTRAVENOUS

## 2019-04-04 MED ORDER — SODIUM CHLORIDE (PF) 0.9 % IJ SOLN
INTRAMUSCULAR | Status: AC
  Filled 2019-04-04: qty 100

## 2019-04-04 MED ORDER — ENSURE ENLIVE PO LIQD
237.0000 mL | Freq: Three times a day (TID) | ORAL | Status: DC
  Administered 2019-04-04 – 2019-04-06 (×5): 237 mL via ORAL

## 2019-04-04 MED ORDER — ENSURE PO LIQD: 237.0000 mL | Freq: Three times a day (TID) | ORAL | Status: DC

## 2019-04-04 MED ORDER — ACETAMINOPHEN 650 MG RE SUPP: 650.0000 mg | Freq: Four times a day (QID) | RECTAL | Status: DC | PRN

## 2019-04-04 MED ORDER — METOPROLOL SUCCINATE ER 25 MG PO TB24
25.0000 mg | ORAL_TABLET | Freq: Every day | ORAL | Status: DC
  Administered 2019-04-04 – 2019-04-06 (×3): 25 mg via ORAL
  Filled 2019-04-04 (×3): qty 1

## 2019-04-04 MED ORDER — ENOXAPARIN SODIUM 60 MG/0.6ML ~~LOC~~ SOLN
1.0000 mg/kg | Freq: Every day | SUBCUTANEOUS | Status: DC
  Administered 2019-04-04 – 2019-04-05 (×2): 45 mg via SUBCUTANEOUS
  Filled 2019-04-04: qty 0.6
  Filled 2019-04-04: qty 0.45

## 2019-04-04 MED ORDER — CETAPHIL MOISTURIZING EX LOTN
1.0000 "application " | TOPICAL_LOTION | Freq: Every day | CUTANEOUS | Status: DC
  Administered 2019-04-04 – 2019-04-05 (×2): 1 via TOPICAL
  Filled 2019-04-04: qty 473

## 2019-04-04 MED ORDER — SODIUM CHLORIDE 0.9 % IV SOLN
2.0000 g | INTRAVENOUS | Status: DC
  Administered 2019-04-04 – 2019-04-05 (×2): 2 g via INTRAVENOUS
  Filled 2019-04-04 (×2): qty 2
  Filled 2019-04-04: qty 20

## 2019-04-04 MED ORDER — ACETAMINOPHEN 325 MG PO TABS
650.0000 mg | ORAL_TABLET | Freq: Four times a day (QID) | ORAL | Status: DC | PRN
  Filled 2019-04-04: qty 2

## 2019-04-04 MED ORDER — IOHEXOL 350 MG/ML SOLN
80.0000 mL | Freq: Once | INTRAVENOUS | Status: AC | PRN
  Administered 2019-04-04: 80 mL via INTRAVENOUS

## 2019-04-04 NOTE — Progress Notes (Signed)
RT attempted arterial blood gas x2 . Day shift RT aware and will attempt to collect.

## 2019-04-04 NOTE — Progress Notes (Signed)
[facility] for Lovenox Indication: new PE  No Known Allergies  Patient Measurements: Height: 5\' 4"  (162.6 cm) Weight: 98 lb 9.6 oz (44.7 kg) IBW/kg (Calculated) : 54.7  Vital Signs: Temp: 98.4 F (36.9 C) (10/04 0530) Temp Source: Oral (10/04 0530) BP: 109/67 (10/04 0830) Pulse Rate: 86 (10/04 0830)  Labs: Recent Labs    04/04/19 0629  HGB 13.7  HCT 44.6  PLT 407*  APTT 25  LABPROT 12.9  INR 1.0  CREATININE 0.78    Estimated Creatinine Clearance: 29.7 mL/min (by C-G formula based on SCr of 0.78 mg/dL).   Medical History: Past Medical History:  Diagnosis Date  . Acne rosacea   . CKD (chronic kidney disease), stage III ([facility])   . Depression   . Hypertension   . Osteopenia   . PUD (peptic ulcer disease)   . Restrictive lung disease   . Shingles   . Tinnitus    and vertigo    Medications:  (Not in a hospital admission)  Scheduled:  . enoxaparin (LOVENOX) injection  1 mg/kg Subcutaneous Daily  . sodium chloride (PF)       Assessment: 85 y.o. female with admitted 04/04/2019 for SOB   CBC: WNL  SCr: WNL, CrCl borderline for renal adjustment at ~30 ml/min  Previous anticoagulation: none   Goal of Therapy: Anti-Xa level 0.6-1 units/ml 4hrs after LMWH dose given  Plan:  Lovenox 45 mg mg SQ q24 hr  Monitor for signs of bleeding or worsening thrombosis  Pharmacy will sign off, monitoring peripherally for changes in labs or clinical status   Reuel Boom, PharmD, BCPS [phone] 04/04/2019, 10:51 AM

## 2019-04-04 NOTE — H&P (Signed)
History and Physical  Madeline Parrish FYB:[id number] DOB: 20-Feb-1922 DOA: 04/04/2019  PCP: Hennie Duos, MD   Chief Complaint: low oxygen level  HPI:  85 year old woman resident SNF [facility], [place] COVID-19 pneumonia July 15, CKD stage III, dementia, restrictive lung disease, paroxysmal supraventricular tachycardia PRESENTED from SNF for newly discovered hypoxia.  Per chart: Patient had flu shot 10/1 and because of that has been getting every shift vital signs and on the third shift, it was not that SpO2 was low.  Patient does not normally require oxygen.  She was placed on 2 L and still oxygen saturations did not reach 90% and therefore she was sent to the emergency department.  Spoke with [facility] staff Lauren. Patient was able to get to wheelchair and pedal around but with COVID restrictions is mostly bedbound. She can be alert and sometimes can assist with feeding; she does not carry on a conversation.  She is not the primary nurse but did receive checkout overnight.  On pureed diet, regular liquids.   Chart review: . Hospital discharge August 2019: Sepsis secondary to pneumonia left lower lobe.  SVT.  Delirium.  Chronic diastolic CHF. [facility] Office note 06/04/2017 visit diagnoses include CKD stage III, idiopathic pulmonary fibrosis, supraventricular tachycardia . Cardiology note 09/02/2016: SVT/PAT.  Controlled on beta-blocker. . No records available in care everywhere  ED Course: Afebrile, SPO2 83 % on room air, vital signs stable.  ABG showed hypoxemia.  SARS-CoV-2 pending.  Review of Systems:  Not obtainable from patient secondary to condition and dementia  Per staff:  Negative for fever, pain, SOB, vomiting, abdominal pain.  Past Medical History:  Diagnosis Date  . Acne rosacea   . Acute pulmonary embolism ([facility]) 04/04/2019  . CKD (chronic kidney disease), stage III   . Depression   . Hypertension   . Osteopenia   . Pneumonia due to COVID-19 virus 01/13/2019  .  PUD (peptic ulcer disease)   . Restrictive lung disease   . Shingles   . Tinnitus    and vertigo    Past Surgical History:  Procedure Laterality Date  . None        reports that she has never smoked. She has never used smokeless tobacco. She reports that she does not drink alcohol or use drugs. Mobility: nearly bedbound  No Known Allergies  Family History  Problem Relation Age of Onset  . CVA Mother      Prior to Admission medications   Medication Sig Start Date End Date Taking? Authorizing Provider  Ascorbic Acid (VITAMIN C) 1000 MG tablet Take 1,000 mg by mouth daily.    [provider]  cetaphil (CETAPHIL) lotion Apply 1 application topically at bedtime. Apply to bilateral lower extremities    [provider]  Cholecalciferol (VITAMIN D3) 1.25 MG (50000 UT) CAPS Take 1 capsule by mouth once a week. 12/07/18 04/26/19  [provider]  clotrimazole-betamethasone (LOTRISONE) cream Apply 1 application topically daily as needed (LLE rash).     [provider]  Ensure (ENSURE) Take 237 mLs by mouth 3 (three) times daily.    [provider]  metoprolol succinate (TOPROL-XL) 25 MG 24 hr tablet Take 25 mg by mouth daily.  09/26/17   [provider]  polyethylene glycol (MIRALAX / GLYCOLAX) packet Take 17 g by mouth daily.    [provider]  potassium chloride (K-DUR,KLOR-CON) 10 MEQ tablet Take 10 mEq by mouth every other day.     [provider]    Physical Exam: Vitals:   04/04/19 0830 04/04/19 0900  BP: 109/67 117/80  Pulse: 86 92  Resp: 13 20  Temp:    SpO2: 95% 91%   Patient did not comply with examination and examination is limited thereby. Constitutional:   . Appears calm and comfortable, cachectic with contractions noted. Eyes:  . pupils and irises appear normal . Normal lids ENMT:  . Hearing probably grossly normal.  She did respond with a few 1 word answers when questioned. . Lips appear  normal Respiratory:  . CTA bilaterally, no w/r/r.  . Respiratory effort normal.  Cardiovascular:  . RRR, no m/r/g . No LLE extremity edema   . There is 2-3+ pedal and lower distal leg edema, pitting.  Nontender. Abdomen:  . Abdomen distended.  No tenderness or masses noted.  Difficult to examine fully with contracted legs. . No hernia noted Musculoskeletal:  . Bilateral upper extremity tone and strength appear grossly normal, patient moves spontaneously especially with adjustment of her nasal cannula  . Bilateral lower extremities contracted.  Does not follow commands. Skin:  . There is a healed ulcer over the right anterior tibia.  Right heel is bandaged but appears unremarkable. . palpation of skin: no induration or nodules Neurologic:  . Cannot assess.  Patient does not comply with exam Psychiatric:  . Mental status o Mood, affect not assessable as patient does not comply with exam o Orientation not assessable . judgment and insight appear impaired  I have personally reviewed following labs and imaging studies  Labs:  ABG on 4 L showed hypoxemia: pH 7.47/30 5/54 CMP was unremarkable BMP equivocal 183 Lactic acid 2.1 > 1.4 CBC unremarkable D-dimer 3.37 SARS-CoV-2 negative Blood culture pending  Imaging studies:   Chest x-ray independently reviewed, there is some vascular crowding.  Per radiology chest x-ray showed mild bilateral chronic interstitial thickening.  No acute cardiopulmonary disease.  CT angiogram chest showed small embolus proximal right lower lobe which may be acute or chronic.    Medical tests:   EKG independently reviewed: Sinus rhythm, first-degree AV block, no acute changes noted.  Principal Problem:   Acute pulmonary embolism (HCC) Active Problems:   Paroxysmal supraventricular tachycardia (HCC)   CKD (chronic kidney disease), stage III   FTT (failure to thrive) in adult   Acute hypoxemic respiratory failure (HCC)   Pneumonia due to COVID-19  virus   Idiopathic pulmonary fibrosis (HCC)   Assessment/Plan Pulmonary embolism right lower lobe with a acute hypoxic respiratory failure. Acute vs chronic. Presume acute based on presentation. --enoxaparin --convert to NOAC tomorrow if stable --Supplemental oxygen  Possible RUL pneumonia based on abnormal CT findings of subtle rectovesicular nodule density right upper lobe which could be acute or chronic process, infectious or inflammatory.  --Patchy mucous plugging.  May be contributing.  Doubt she will tolerate chest PT. --Initiate empiric antibiotics, check procalcitonin.  No leukocytosis or fever.  Procalcitonin is negative would stop antibiotics.  COVID positive in July, treated successfully as an outpatient at SNF --Repeat SARS-CoV-2 negative, no history to suggest recurrence  Failure to thrive, advanced dementia.  Weight at nursing home reportedly 106, here 98.6 --Per chart review, reportedly does not speak at baseline --Appears cachectic. --Dietitian consultation --Hospice appears appropriate  Idiopathic pulmonary fibrosis per office note 06/04/2017 --Not known to SNF or brother  Right foot edema.  Seen September 17 by PCP at [facility].  At that time "not really new... I believe this is more dependent edema" --Given  PE, she could have right lower extremity DVT.  She has severe contracture on the right side and becomes agitated with nasal cannula manipulation.  I doubt she would tolerate a venous Doppler study and she has no evidence of complicating features.  Further work-up would not change management at this point.  Subtle rectovesicular nodule density right upper lobe which could be acute or chronic process, infectious or inflammatory.  Patchy mucous plugging.   --Empiric antibiotic pending procalcitonin.  This may be a chronic process.  PMH paroxysmal supraventricular tachycardia --In sinus rhythm.  Continue Toprol-XL  CKD stage III --Appears stable.  Chronic diastolic CHF  --Except for right pedal and lower leg edema this appears stable and I doubt her edema is from CHF.  Dementia --This appears to be advanced but currently stable.  Mild ectasia ascending thoracic aorta --Recommend no further evaluation given her advanced age and comorbidities.  Aortic atherosclerosis --Recommend no treatment given her advanced age and comorbidities  Severity of Illness: The appropriate patient status for this patient is INPATIENT. Inpatient status is judged to be reasonable and necessary in order to provide the required intensity of service to ensure the patient's safety. The patient's presenting symptoms, physical exam findings, and initial radiographic and laboratory data in the context of their chronic comorbidities is felt to place them at high risk for further clinical deterioration. Furthermore, it is not anticipated that the patient will be medically stable for discharge from the hospital within 2 midnights of admission. The following factors support the patient status of inpatient.   " The patient's presenting symptoms include hypoxia. " The worrisome physical exam findings include hypoxia, cachexia, lower extremity contractures. " The initial radiographic and laboratory data are worrisome because of solitary PE, inflammatory or infectious lung abnormalities right upper lobe. " The chronic co-morbidities include severe dementia.   * I certify that at the point of admission it is my clinical judgment that the patient will require inpatient hospital care spanning beyond 2 midnights from the point of admission due to high intensity of service, high risk for further deterioration and high frequency of surveillance required.*   DVT prophylaxis: enoxaparin Code Status: DNR/DNI Family Communication: brother/HCPOA by telephone Consults called: none    Time spent: 70 minutes  Brendia Sacksaniel Keyia Moretto, MD  [facility] Direct contact: see [email or web address]  7PM-7AM contact  night coverage as below   1. Check the care team in [facility] and look for a) attending/consulting [facility] provider listed and b) the [facility] team listed 2. Log into [email or web address] and use [facility] universal password to access. If you do not have the password, please contact the hospital operator. 3. Locate the [facility] provider you are looking for under [facility] and page to a number that you can be directly reached. 4. If you still have difficulty reaching the provider, please page the [facility] (Director on Call) for the Hospitalists listed on amion for assistance.   04/04/2019, 12:12 PM

## 2019-04-04 NOTE — Progress Notes (Signed)
Notified bedside nurse of need to draw repeat lactic acid @ 0830.

## 2019-04-04 NOTE — ED Provider Notes (Signed)
Signed out to admit to hospitalists if ct positive for pe as pt hypoxic.  CT positive for PE.   Hospitalist consulted for admission.     Lajean Saver, MD 04/04/19 [phone]

## 2019-04-04 NOTE — Progress Notes (Signed)
Notified bedside nurse of need to administer antibiotics, asked to see if MD wants to DC sepsis protocol or treat pt per protocol., sepsis protocol called [id number]

## 2019-04-04 NOTE — ED Notes (Signed)
Date and time results received: 10/04/200720  (use smartphrase ".now" to insert current time)  Test:lactic acid Critical Value: 2.1  Name of Provider Notified: Kathrynn Humble  Orders Received? Or Actions Taken?: Actions Taken: waiting for response'

## 2019-04-04 NOTE — ED Notes (Signed)
[facility] called and reported that a Code Sepsis was called at 0600 and wanted writer to tell physician to either cancel or order antibiotics. Message relayed to Dr. Ashok Cordia.

## 2019-04-04 NOTE — ED Triage Notes (Signed)
Patient brought in by EMS from SNF [facility]) c/o low o2sat. Per EMS pt o2sat when they arrived was 87% on RA. Pt hx of dementia.

## 2019-04-04 NOTE — Progress Notes (Signed)
Notified bedside nurse of need to administer antibiotics and fluids.  

## 2019-04-04 NOTE — ED Provider Notes (Signed)
[facility]-EMERGENCY DEPT Provider Note   CSN: [id number] Arrival date & time: 04/04/19  [id number]     History   Chief Complaint No chief complaint on file.   HPI Madeline Parrish is a 85 y.o. female.     HPI  85 year old comes in a chief complaint of low oxygen saturation. Patient denies any complaints from her side.  I called the nursing home and I was informed that patient just had her flu shot.  Because of the flu vaccination she gets every shift vital signs and the third shift noted that patient's oxygen saturations were low.  She normally does not require oxygen.  They put her on 2 L of oxygen and still her O2 sats were in the upper 80s therefore she was sent to the ED.   Of note, patient had acquired COVID-19 pneumonia complicated with bacterial pneumonia and UTI on July 15.  She had recovered from the infection.  Since the infection however she has been mostly bedbound and has contractures.  I called patient's POA who is noted as her brother.  Unfortunately he did not pick up the phone.  Past Medical History:  Diagnosis Date  . Acne rosacea   . CKD (chronic kidney disease), stage III (HCC)   . Depression   . Hypertension   . Osteopenia   . PUD (peptic ulcer disease)   . Restrictive lung disease   . Shingles   . Tinnitus    and vertigo    Patient Active Problem List   Diagnosis Date Noted  . Vitamin D deficiency 03/19/2019  . Real time reverse transcriptase PCR positive for COVID-19 virus 02/13/2019  . Poor fluid intake 02/13/2019  . Alteration in cognition 02/13/2019  . Urinary tract infection due to Proteus 01/17/2019  . Failure to thrive in adult 10/05/2018  . Chronic constipation 10/05/2018  . Cellulitis of left lower extremity 07/07/2018  . Pneumonia of lower lobe of lung 02/09/2018  . Delirium 02/09/2018  . Chronic diastolic (congestive) heart failure (HCC) 02/09/2018  . Hematoma of left lower extremity 02/09/2018  . Acute on  chronic diastolic CHF (congestive heart failure) (HCC) 01/30/2018  . Sepsis due to pneumonia (HCC) 01/30/2018  . Agitation 01/30/2018  . Right leg injury, subsequent encounter 01/30/2018  . Benign hypertension with chronic kidney disease, stage III 10/06/2017  . Chronic kidney disease, stage III (moderate) 10/06/2017  . Restrictive airway disease 10/06/2017  . Fall 09/29/2017  . Acute renal failure superimposed on stage 3 chronic kidney disease (HCC) 09/29/2017  . Bilateral lower extremity edema 08/15/2015  . Paroxysmal supraventricular tachycardia (HCC) 12/06/2013  . Essential hypertension, benign 12/06/2013  . Syncope and collapse 12/06/2013    No past surgical history on file.   OB History   No obstetric history on file.      Home Medications    Prior to Admission medications   Medication Sig Start Date End Date Taking? Authorizing Provider  Ascorbic Acid (VITAMIN C) 1000 MG tablet Take 1,000 mg by mouth daily.    [provider]  cetaphil (CETAPHIL) lotion Apply 1 application topically at bedtime. Apply to bilateral lower extremities    [provider]  Cholecalciferol (VITAMIN D3) 1.25 MG (50000 UT) CAPS Take 1 capsule by mouth once a week. 12/07/18 04/26/19  [provider]  clotrimazole-betamethasone (LOTRISONE) cream Apply 1 application topically daily as needed (LLE rash).     [provider]  Ensure (ENSURE) Take 237 mLs by mouth 3 (three)  times daily.    [provider]  metoprolol succinate (TOPROL-XL) 25 MG 24 hr tablet Take 25 mg by mouth daily.  09/26/17   [provider]  polyethylene glycol (MIRALAX / GLYCOLAX) packet Take 17 g by mouth daily.    [provider]  potassium chloride (K-DUR,KLOR-CON) 10 MEQ tablet Take 10 mEq by mouth every other day.     [provider]    Family History Family History  Problem Relation Age of Onset  . CVA Mother     Social History Social History    Tobacco Use  . Smoking status: Never Smoker  . Smokeless tobacco: Never Used  Substance Use Topics  . Alcohol use: No  . Drug use: No     Allergies   Patient has no known allergies.   Review of Systems Review of Systems  Constitutional: Positive for activity change. Negative for fever.  Respiratory: Negative for shortness of breath.   Cardiovascular: Negative for chest pain.  Gastrointestinal: Negative for nausea and vomiting.  All other systems reviewed and are negative.    Physical Exam Updated Vital Signs BP 116/83   Pulse 98   Temp 98.4 F (36.9 C) (Oral)   Resp 19   SpO2 93%   Physical Exam Vitals signs and nursing note reviewed.  Constitutional:      Appearance: She is well-developed.  HENT:     Head: Atraumatic.  Neck:     Musculoskeletal: Normal range of motion and neck supple.  Cardiovascular:     Rate and Rhythm: Normal rate.  Pulmonary:     Effort: Pulmonary effort is normal.     Breath sounds: No wheezing, rhonchi or rales.  Abdominal:     General: Bowel sounds are normal.  Musculoskeletal:     Comments: Contracted lower extremities  Skin:    General: Skin is warm and dry.  Neurological:     Mental Status: She is alert and oriented to person, place, and time.      ED Treatments / Results  Labs (all labs ordered are listed, but only abnormal results are displayed) Labs Reviewed  CULTURE, BLOOD (ROUTINE X 2)  CULTURE, BLOOD (ROUTINE X 2)  URINE CULTURE  SARS CORONAVIRUS 2 (HOSPITAL ORDER, PERFORMED IN [facility])  LACTIC ACID, PLASMA  LACTIC ACID, PLASMA  COMPREHENSIVE METABOLIC PANEL  CBC WITH DIFFERENTIAL/PLATELET  APTT  PROTIME-INR  URINALYSIS, ROUTINE W REFLEX MICROSCOPIC  BLOOD GAS, ARTERIAL  D-DIMER, QUANTITATIVE (NOT AT [facility])  BRAIN NATRIURETIC PEPTIDE    EKG None  Radiology No results found.  Procedures .Critical Care Performed by: Derwood Kaplan, MD Authorized by: Derwood Kaplan, MD   Critical  care provider statement:    Critical care time (minutes):  32   Critical care was necessary to treat or prevent imminent or life-threatening deterioration of the following conditions:  Respiratory failure   Critical care was time spent personally by me on the following activities:  Discussions with consultants, evaluation of patient's response to treatment, examination of patient, ordering and performing treatments and interventions, ordering and review of laboratory studies, ordering and review of radiographic studies, pulse oximetry, re-evaluation of patient's condition, obtaining history from patient or surrogate and review of old charts   (including critical care time)  Medications Ordered in ED Medications - No data to display   Initial Impression / Assessment and Plan / ED Course  I have reviewed the triage vital signs and the nursing notes.  Pertinent labs & imaging  results that were available during my care of the patient were reviewed by me and considered in my medical decision making (see chart for details).        85 year old female comes in a chief complaint of hypoxia.  She had acquired COVID-19 pneumonia earlier in the summer and had recovered.  She has no complaints from her side but every shift vitals picked up hypoxia this morning.  She is contracted and exam is limited because of her positioning.  No rhonchi or wheezing appreciated.  She is mildly tachypneic.  Differential diagnosis includes PE, bacterial pneumonia. There was a singular case of COVID-19 recently at the facility but all the employees tested negative thereafter.  COVID-19 reinfection is still possible but lower in the differential.  SENA HOOPINGARNER was evaluated in Emergency Department on 04/04/2019 for the symptoms described in the history of present illness. She was evaluated in the context of the global COVID-19 pandemic, which necessitated consideration that the patient might be at risk for infection  with the SARS-CoV-2 virus that causes COVID-19. Institutional protocols and algorithms that pertain to the evaluation of patients at risk for COVID-19 are in a state of rapid change based on information released by regulatory bodies including the CDC and federal and state organizations. These policies and algorithms were followed during the patient's care in the ED.  CODE STATUS is listed as DNR. I am not sure if she is DNI or not.  Again, we tried to contact patient's brother and he did not pick up.  Final Clinical Impressions(s) / ED Diagnoses   Final diagnoses:  Acute hypoxemic respiratory failure [facility])    ED Discharge Orders    None       Varney Biles, MD 04/04/19 [phone]

## 2019-04-05 LAB — BASIC METABOLIC PANEL
Anion gap: 11 (ref 5–15)
BUN: 19 mg/dL (ref 8–23)
CO2: 23 mmol/L (ref 22–32)
Calcium: 8.6 mg/dL — ABNORMAL LOW (ref 8.9–10.3)
Chloride: 114 mmol/L — ABNORMAL HIGH (ref 98–111)
Creatinine, Ser: 0.79 mg/dL (ref 0.44–1.00)
GFR calc Af Amer: >60 mL/min (ref >60)
GFR calc non Af Amer: >60 mL/min (ref >60)
Glucose, Bld: 77 mg/dL (ref 70–99)
Potassium: 3.4 mmol/L — ABNORMAL LOW (ref 3.5–5.1)
Sodium: 148 mmol/L — ABNORMAL HIGH (ref 135–145)

## 2019-04-05 LAB — CBC
HCT: 41.1 % (ref 36.0–46.0)
Hemoglobin: 12.9 g/dL (ref 12.0–15.0)
MCH: 31.1 pg (ref 26.0–34.0)
MCHC: 31.4 g/dL (ref 30.0–36.0)
MCV: 99 fL (ref 80.0–100.0)
Platelets: 370 10*3/uL (ref 150–400)
RBC: 4.15 MIL/uL (ref 3.87–5.11)
RDW: 16.9 % — ABNORMAL HIGH (ref 11.5–15.5)
WBC: 8.1 10*3/uL (ref 4.0–10.5)
nRBC: 0 % (ref 0.0–0.2)

## 2019-04-05 LAB — MRSA PCR SCREENING: MRSA by PCR: NEGATIVE

## 2019-04-05 MED ORDER — APIXABAN 5 MG PO TABS
5.0000 mg | ORAL_TABLET | Freq: Two times a day (BID) | ORAL | Status: DC
  Administered 2019-04-06: 5 mg via ORAL
  Filled 2019-04-05: qty 1

## 2019-04-05 MED ORDER — APIXABAN 2.5 MG PO TABS: 2.5000 mg | ORAL_TABLET | Freq: Two times a day (BID) | ORAL | Status: DC

## 2019-04-05 MED ORDER — BOOST / RESOURCE BREEZE PO LIQD CUSTOM
1.0000 | Freq: Two times a day (BID) | ORAL | Status: DC
  Administered 2019-04-05: 1 via ORAL

## 2019-04-05 MED ORDER — ADULT MULTIVITAMIN W/MINERALS CH
1.0000 | ORAL_TABLET | Freq: Every day | ORAL | Status: DC
  Administered 2019-04-05 – 2019-04-06 (×2): 1 via ORAL
  Filled 2019-04-05 (×3): qty 1

## 2019-04-05 NOTE — Progress Notes (Signed)
Initial Nutrition Assessment  RD working remotely.   DOCUMENTATION CODES:   Underweight  INTERVENTION:  - continue Ensure Enlive BID, each supplement provides 350 kcal and 20 grams of protein. - will order Boost Breeze BID, each supplement provides 250 kcal and 9 grams of protein. - continue to encourage PO intakes and floor staff to provide feeding assistance as needs.    NUTRITION DIAGNOSIS:   Increased nutrient needs related to acute illness as evidenced by estimated needs.  GOAL:   Patient will meet greater than or equal to 90% of their needs  MONITOR:   PO intake, Supplement acceptance, Labs, Weight trends, Skin  REASON FOR ASSESSMENT:   Consult Assessment of nutrition requirement/status  ASSESSMENT:   85 year old female who is a resident SNF [facility] and who has past medical history of stage 3 CKD, dementia, restrictive lung disease, and paroxysmal supraventricular tachycardia. She presented to the ED from SNF on 10/4 due to newly discovered hypoxia. She does not require O2 at baseline but was placed on 2L O2 at SNF d/t low sats but did not reach over 90%. She has been mostly bedbound since COVID pandemic began d/t her baseline being to get into wheelchair and use her feet to move wheelchair around, but unable to leave room much d/t restrictions. She does not carry on a conversation at baseline.  Per flow sheet, patient consumed 95% of breakfast and 20% of lunch today. Flow sheet indicates that patient is disoriented x4. Current weight is 99 lb and weight on 8/4 was 102 lb which indicates 3 lb weight loss in 2 months; not significant for time frame. Suspect some degree of malnutrition given BMI of 16.9 but unable to confirm at this time.  Per notes: - RLL pulmonary embolism - acute hypoxic respiratory failure  - abnormal CT chest showing nodule density in RUL - COVID positive in July--was successfully treated outpatient at SNF - FTT - advanced dementia and  non-verbal at baseline - R foot edema - likely d/c back to SNF on 10/6    Labs reviewed; Na: 148 mmol/l, K: 3.4 mmol/l, Cl; 114 mmol/l, Ca: 8.6 mg/dl. Medications reviewed; daily multivitamin with minerals, 1 packet miralax/day.     NUTRITION - FOCUSED PHYSICAL EXAM:  unable to complete at this time.   Diet Order:   Diet Order            DIET - DYS 1 Room service appropriate? Yes; Fluid consistency: Thin  Diet effective now              EDUCATION NEEDS:   No education needs have been identified at this time  Skin:  Skin Assessment: Skin Integrity Issues: Skin Integrity Issues:: Stage I, Stage II Stage I: R heel Stage II: R buttocks, sacrum  Last BM:  10/4  Height:   Ht Readings from Last 1 Encounters:  04/04/19 5\' 4"  (1.626 m)    Weight:   Wt Readings from Last 1 Encounters:  04/04/19 44.7 kg    Ideal Body Weight:  54.5 kg  BMI:  Body mass index is 16.92 kg/m.  Estimated Nutritional Needs:   Kcal:  1350-1565 kcal  Protein:  65-80 grams  Fluid:  >/= 1.6 L/day      Jarome Matin, MS, RD, LDN, [facility] Inpatient Clinical Dietitian Pager # [phone] After hours/weekend pager # [phone]

## 2019-04-05 NOTE — Progress Notes (Signed)
PROGRESS NOTE  Madeline Parrish  DOB: [birth year]  PCP: Madeline Hanks, MD [facility]  DOA: 04/04/2019  LOS: 1 day   Brief narrative: Patient is a 85 year old woman resident [facility], PMH COVID-19 pneumonia July 15, CKD stage III, dementia, restrictive lung disease, paroxysmal supraventricular tachycardia. She presented from SNF for newly discovered hypoxia. Patient had flu shot 10/1 and because of that has been getting every shift vital signs and on the third shift, it was not that SpO2 was low.  Patient does not normally require oxygen. She was placed on 2 L and still oxygen saturations did not reach 90% and therefore she was sent to the emergency department. Patient was able to get to wheelchair and pedal around but with COVID restrictions is mostly bedbound. She can be alert and sometimes can assist with feeding; she does not carry on a conversation.   On pureed diet, regular liquids at SNF.  ED Course: Afebrile, SPO2 83 % on room air, vital signs stable.   ABG showed hypoxemia. 7.47/35/54 on 4lpm O2. SARS-CoV-2 negative. CT angiogram of chest showed small embolus proximal right lower lobe which may be acute or chronic.  Subjective: Patient was seen and examined this morning.  Demented elderly female, not able to have a conversation.  Seems depressed.  Not in physical distress.  Assessment/Plan:  Pulmonary embolism right lower lobe with a acute hypoxic respiratory failure. Acute vs chronic. Presume acute based on presentation. -Currently on full dose Lovenox.  Discussed with patient's brother Madeline Parrish.  He states that patient is mostly bedbound.  She does not have any history of GI bleeding or internal bleeding.  I will start the patient on Eliquis twice daily at lower dose.  Stop Lovenox full dose. --Supplemental oxygen  Abnormal chest imaging - CT findings of subtle rectovesicular nodule density right upper lobe    -Patchy mucous plugging.  May be contributing.   Doubt she will tolerate chest PT. -No leukocytosis or fever.  Procalcitonin is negative. Antibiotics stopped.  COVID positive in July, treated successfully as an outpatient at SNF -Repeat SARS-CoV-2 negative, no history to suggest recurrence  Failure to thrive, advanced dementia.   -Weight at nursing home reportedly 106, here 98.6 --Per chart review, reportedly does not speak at baseline --Appears cachectic. --Dietitian consultation --Hospice appears appropriate  Idiopathic pulmonary fibrosis per office note 06/04/2017 --Not known to SNF or brother  Right foot edema.  Seen September 17 by PCP at [facility].  At that time "not really new... I believe this is more dependent edema" --Given PE, she could have right lower extremity DVT.  She has severe contracture on the right side and becomes agitated with nasal cannula manipulation.  I doubt she would tolerate a venous Doppler study and she has no evidence of complicating features.  Further work-up would not change management at this point.  PMH paroxysmal supraventricular tachycardia --In sinus rhythm.  Continue Toprol-XL  CKD stage III --Appears stable.  Chronic diastolic CHF --Except for right pedal and lower leg edema this appears stable and I doubt her edema is from CHF.  Dementia --This appears to be advanced but currently stable.  Mild ectasia ascending thoracic aorta --Recommend no further evaluation given her advanced age and comorbidities.  Aortic atherosclerosis --Recommend no treatment given her advanced age and comorbidities  Mobility:  DVT prophylaxis:  Bedbound Code Status:   Code Status: DNR  Family Communication:  I spoke with patient's brother and POA Madeline Parrish. Expected Discharge:  Anticipate  discharge back to [facility].  Consultants:    Procedures:    Antimicrobials: Anti-infectives (From admission, onward)   Start     Dose/Rate Route Frequency Ordered Stop   04/04/19 1600   cefTRIAXone (ROCEPHIN) 2 g in sodium chloride 0.9 % 100 mL IVPB     2 g 200 mL/hr over 30 Minutes Intravenous Every 24 hours 04/04/19 1527 04/09/19 1559   04/04/19 1600  azithromycin (ZITHROMAX) 500 mg in sodium chloride 0.9 % 250 mL IVPB     500 mg 250 mL/hr over 60 Minutes Intravenous Every 24 hours 04/04/19 1527 04/09/19 1559      Diet Order            DIET - DYS 1 Room service appropriate? Yes; Fluid consistency: Thin  Diet effective now              Infusions:  . azithromycin 500 mg (04/04/19 1808)  . cefTRIAXone (ROCEPHIN)  IV 2 g (04/04/19 1718)    Scheduled Meds: . cetaphil  1 application Topical QHS  . enoxaparin (LOVENOX) injection  1 mg/kg Subcutaneous Daily  . feeding supplement (ENSURE ENLIVE)  237 mL Oral TID BM  . metoprolol succinate  25 mg Oral Daily  . polyethylene glycol  17 g Oral Daily  . sodium chloride flush  3 mL Intravenous Q12H    PRN meds: acetaminophen **OR** acetaminophen   Objective: Vitals:   04/05/19 0424 04/05/19 1416  BP:  102/66  Pulse:  84  Resp: 16 16  Temp:  98 F (36.7 C)  SpO2:  91%    Intake/Output Summary (Last 24 hours) at 04/05/2019 1439 Last data filed at 04/05/2019 1200 Gross per 24 hour  Intake 360 ml  Output -  Net 360 ml   Filed Weights   04/04/19 0737  Weight: 44.7 kg   Weight change:  Body mass index is 16.92 kg/m.   Physical Exam: General exam: Elderly demented female.  Not in distress today Skin: No rashes, lesions or ulcers. HEENT: Atraumatic, normocephalic, supple neck, no obvious bleeding Lungs: Clear to auscultation bilaterally CVS: Regular rate and rhythm, no murmur GI/Abd soft, nontender, nondistended, bowel sound present CNS:, Sleeping, opens eyes on verbal command, demented, unable to answer questions Psychiatry: -Mood appropriate Extremities: Bilateral pedal edema  Data Review: I have personally reviewed the laboratory data and studies available.  Recent Labs  Lab 04/04/19 0629  04/05/19 0433  WBC 8.4 8.1  NEUTROABS 6.0  --   HGB 13.7 12.9  HCT 44.6 41.1  MCV 100.7* 99.0  PLT 407* 370   Recent Labs  Lab 04/04/19 0629 04/05/19 0433  NA 145 148*  K 4.3 3.4*  CL 113* 114*  CO2 23 23  GLUCOSE 94 77  BUN 21 19  CREATININE 0.78 0.79  CALCIUM 8.4* 8.6*    Terrilee Croak, MD  [facility] 04/05/2019

## 2019-04-05 NOTE — TOC Progression Note (Signed)
Transition of Care [facility]) - Progression Note    Patient Details  Name: Madeline Parrish MRN: [id number] Date of Birth: May 04, 1922  Transition of Care [facility]) CM/SW Contact  Purcell Mouton, RN Phone Number: 04/05/2019, 3:55 PM  Clinical Narrative:    Pt from [facility] and plan to return. A call was made to [facility]mission [facility]. [phone], VM was left.         Expected Discharge Plan and Services           Expected Discharge Date: 04/07/19                                     Social Determinants of Health (SDOH) Interventions    Readmission Risk Interventions No flowsheet data found.

## 2019-04-06 DIAGNOSIS — M6281 Muscle weakness (generalized): Secondary | ICD-10-CM | POA: Diagnosis not present

## 2019-04-06 DIAGNOSIS — D649 Anemia, unspecified: Secondary | ICD-10-CM | POA: Diagnosis not present

## 2019-04-06 DIAGNOSIS — R1312 Dysphagia, oropharyngeal phase: Secondary | ICD-10-CM | POA: Diagnosis not present

## 2019-04-06 DIAGNOSIS — E43 Unspecified severe protein-calorie malnutrition: Secondary | ICD-10-CM | POA: Diagnosis not present

## 2019-04-06 DIAGNOSIS — F0390 Unspecified dementia without behavioral disturbance: Secondary | ICD-10-CM | POA: Diagnosis not present

## 2019-04-06 DIAGNOSIS — M2550 Pain in unspecified joint: Secondary | ICD-10-CM | POA: Diagnosis not present

## 2019-04-06 DIAGNOSIS — R52 Pain, unspecified: Secondary | ICD-10-CM | POA: Diagnosis not present

## 2019-04-06 DIAGNOSIS — I129 Hypertensive chronic kidney disease with stage 1 through stage 4 chronic kidney disease, or unspecified chronic kidney disease: Secondary | ICD-10-CM | POA: Diagnosis not present

## 2019-04-06 DIAGNOSIS — K5909 Other constipation: Secondary | ICD-10-CM | POA: Diagnosis not present

## 2019-04-06 DIAGNOSIS — M6249 Contracture of muscle, multiple sites: Secondary | ICD-10-CM | POA: Diagnosis not present

## 2019-04-06 DIAGNOSIS — Z7401 Bed confinement status: Secondary | ICD-10-CM | POA: Diagnosis not present

## 2019-04-06 DIAGNOSIS — Z9181 History of falling: Secondary | ICD-10-CM | POA: Diagnosis not present

## 2019-04-06 DIAGNOSIS — I471 Supraventricular tachycardia: Secondary | ICD-10-CM | POA: Diagnosis not present

## 2019-04-06 DIAGNOSIS — R41841 Cognitive communication deficit: Secondary | ICD-10-CM | POA: Diagnosis not present

## 2019-04-06 DIAGNOSIS — I509 Heart failure, unspecified: Secondary | ICD-10-CM | POA: Diagnosis not present

## 2019-04-06 DIAGNOSIS — L8990 Pressure ulcer of unspecified site, unspecified stage: Secondary | ICD-10-CM | POA: Insufficient documentation

## 2019-04-06 DIAGNOSIS — L03116 Cellulitis of left lower limb: Secondary | ICD-10-CM | POA: Diagnosis not present

## 2019-04-06 DIAGNOSIS — R5381 Other malaise: Secondary | ICD-10-CM | POA: Diagnosis not present

## 2019-04-06 DIAGNOSIS — I2699 Other pulmonary embolism without acute cor pulmonale: Secondary | ICD-10-CM | POA: Diagnosis not present

## 2019-04-06 DIAGNOSIS — E876 Hypokalemia: Secondary | ICD-10-CM | POA: Diagnosis not present

## 2019-04-06 DIAGNOSIS — M6282 Rhabdomyolysis: Secondary | ICD-10-CM | POA: Diagnosis not present

## 2019-04-06 DIAGNOSIS — E785 Hyperlipidemia, unspecified: Secondary | ICD-10-CM | POA: Diagnosis not present

## 2019-04-06 DIAGNOSIS — I10 Essential (primary) hypertension: Secondary | ICD-10-CM | POA: Diagnosis not present

## 2019-04-06 DIAGNOSIS — Z8619 Personal history of other infectious and parasitic diseases: Secondary | ICD-10-CM | POA: Diagnosis not present

## 2019-04-06 DIAGNOSIS — I5032 Chronic diastolic (congestive) heart failure: Secondary | ICD-10-CM | POA: Diagnosis not present

## 2019-04-06 DIAGNOSIS — R627 Adult failure to thrive: Secondary | ICD-10-CM | POA: Diagnosis not present

## 2019-04-06 DIAGNOSIS — J9601 Acute respiratory failure with hypoxia: Secondary | ICD-10-CM | POA: Diagnosis not present

## 2019-04-06 DIAGNOSIS — I69828 Other speech and language deficits following other cerebrovascular disease: Secondary | ICD-10-CM | POA: Diagnosis not present

## 2019-04-06 DIAGNOSIS — N183 Chronic kidney disease, stage 3 unspecified: Secondary | ICD-10-CM | POA: Diagnosis not present

## 2019-04-06 DIAGNOSIS — E559 Vitamin D deficiency, unspecified: Secondary | ICD-10-CM | POA: Diagnosis not present

## 2019-04-06 DIAGNOSIS — R0902 Hypoxemia: Secondary | ICD-10-CM | POA: Diagnosis not present

## 2019-04-06 LAB — CBC
HCT: 37.5 % (ref 36.0–46.0)
Hemoglobin: 11.5 g/dL — ABNORMAL LOW (ref 12.0–15.0)
MCH: 30.7 pg (ref 26.0–34.0)
MCHC: 30.7 g/dL (ref 30.0–36.0)
MCV: 100.3 fL — ABNORMAL HIGH (ref 80.0–100.0)
Platelets: 354 10*3/uL (ref 150–400)
RBC: 3.74 MIL/uL — ABNORMAL LOW (ref 3.87–5.11)
RDW: 17.1 % — ABNORMAL HIGH (ref 11.5–15.5)
WBC: 7.6 10*3/uL (ref 4.0–10.5)
nRBC: 0 % (ref 0.0–0.2)

## 2019-04-06 LAB — URINALYSIS, ROUTINE W REFLEX MICROSCOPIC
Bilirubin Urine: NEGATIVE
Glucose, UA: NEGATIVE mg/dL
Ketones, ur: 5 mg/dL — AB
Nitrite: NEGATIVE
Protein, ur: 100 mg/dL — AB
Specific Gravity, Urine: >1.046 — ABNORMAL HIGH (ref 1.005–1.030)
pH: 5 (ref 5.0–8.0)

## 2019-04-06 MED ORDER — APIXABAN 5 MG PO TABS: 5.0000 mg | ORAL_TABLET | Freq: Two times a day (BID) | ORAL | Status: DC

## 2019-04-06 MED ORDER — SODIUM CHLORIDE 0.9 % IV SOLN: INTRAVENOUS | Status: DC

## 2019-04-06 NOTE — Discharge Instructions (Signed)

## 2019-04-06 NOTE — Discharge Summary (Signed)
Physician Discharge Summary  Madeline SchilderMargaret M Parrish ZOX:[id number] DOB: 1922/02/04 DOA: 04/04/2019  PCP: Margit HanksAlexander, Madeline D, MD  Admit date: 04/04/2019 Discharge date: 04/06/2019  Admitted From: SNF Discharge disposition: SNF   Code Status: DNR  Diet Recommendation: Cardiac diet   Recommendations for Outpatient Follow-Up:   1. Follow-up with PCP  Discharge Diagnosis:   Principal Problem:   Acute pulmonary embolism (HCC) Active Problems:   Paroxysmal supraventricular tachycardia (HCC)   CKD (chronic kidney disease), stage III   FTT (failure to thrive) in adult   Acute hypoxemic respiratory failure (HCC)   Pneumonia due to COVID-19 virus   Idiopathic pulmonary fibrosis (HCC)   Pressure injury of skin    History of Present Illness / Brief narrative:  Patient is a 85 year old woman resident SNF [facility], PMH COVID-19 pneumonia July 15, CKD stage III, dementia, restrictive lung disease, paroxysmal supraventricular tachycardia. She presented from SNF for newly discovered hypoxia. Patient had flu shot10/1and because of that has been getting every shift vital signs and on the third shift, it was not that SpO2 was low. Patient does not normally require oxygen. She was placed on 2 L and still oxygen saturations did not reach 90% and therefore she was sent to the emergency department. Patient was able to get to wheelchair and pedal around but with COVID restrictions is mostly bedbound. She can be alert and sometimes can assist with feeding; she does not carry on a conversation. On pureed diet, regular liquids at SNF.  ED Course:Afebrile, SPO2 83 % on room air, vital signs stable.  ABG showed hypoxemia. 7.47/35/54 on 4lpm O2. SARS-CoV-2 negative. CT angiogram of chest showed small embolus proximal right lower lobe which may be acute or chronic.  Subjective:  Seen and examined this morning.  Lying on bed.  Not in distress.  Alert, awake, unable to communicate.  Hospital Course:   Pulmonary embolism right lower lobewith a acute hypoxic respiratory failure. Acute vs chronic. Presume acute based on presentation. -Initially started on full dose Lovenox.    10/5, I discussed with patient's brother Mr. Madeline Parrish.  He states that patient is mostly bedbound.  She does not have any history of GI bleeding or internal bleeding.  I started the patient on Eliquis twice daily at 5 mg twice daily. -Supplemental oxygen as needed.  Abnormal chest imaging - CT findings of subtle rectovesicular nodule density right upper lobe  -Patchy mucous plugging.May be contributing. chest PT as tolerated. -No leukocytosis or fever. Procalcitonin is negative. Antibiotics stopped.  COVID positive in July, [facility] -Repeat SARS-CoV-2 negative, no history to suggest recurrence  Failure to thrive, advanced dementia.  --Per chart review, reportedly does not speak at baseline --Appears cachectic. --Dietitian consultation --Hospice appears appropriate  Idiopathic pulmonary fibrosis per office note 06/04/2017 --Not known to SNF or brother  Right foot edema.[facility] At that time "not really new.Madeline Parrish.Madeline Parrish.I believe this is more dependent edema" --Given PE, she could have right lower extremity DVT. She has severe contracture on the right side and becomes agitated with nasal cannula manipulation. I doubt she would tolerate a venous Doppler study and she has no evidence of complicating features. Further work-up would not change management at this point.  PMH paroxysmal supraventricular tachycardia --In sinus rhythm. Continue Toprol-XL  CKD stage III --Appears stable.  Chronic diastolic CHF --Except for right pedal and lower leg edema this appears stable and I doubt her edema is from CHF.  Dementia --This appears to be advanced but currently stable.  Mild ectasia ascending thoracic aorta --Recommend no further evaluation  given her advanced age and comorbidities.  Aortic atherosclerosis --Recommend no treatment given her advanced age and comorbidities  Stable for discharge back to SNF today.   Discharge Exam:   Vitals:   04/05/19 0424 04/05/19 1416 04/05/19 2028 04/06/19 0557  BP:  102/66 112/64 129/86  Pulse:  84 69 83  Resp: 16 16 18 16   Temp:  98 F (36.7 C) 97.7 F (36.5 C) 98.4 F (36.9 C)  TempSrc:  Oral Oral Oral  SpO2:  91% 100% 98%  Weight:      Height:        Body mass index is 16.92 kg/m.  General exam: Appears calm and comfortable.  Skin: No rashes, lesions or ulcers. HEENT: Atraumatic, normocephalic, supple neck, no obvious bleeding Lungs: clear to auscultation bilaterally CVS: Regular rate and rhythm, no murmur GI/Abd soft, nondistended, nontender, bowel sound present CNS: Alert, awake, unable to communicate at baseline, advanced dementia Psychiatry: Mood appropriate Extremities: No pedal edema, no calf tenderness  Discharge Instructions:  Wound care: Pressure ulcer management as before Discharge Instructions    Diet - low sodium heart healthy   Complete by: As directed    Increase activity slowly   Complete by: As directed       Allergies as of 04/06/2019   No Known Allergies     Medication List    TAKE these medications   apixaban 5 MG Tabs tablet Commonly known as: ELIQUIS Take 1 tablet (5 mg total) by mouth 2 (two) times daily.   Ensure Take 237 mLs by mouth 3 (three) times daily.   metoprolol succinate 25 MG 24 hr tablet Commonly known as: TOPROL-XL Take 25 mg by mouth daily.   polyethylene glycol 17 g packet Commonly known as: MIRALAX / GLYCOLAX Take 17 g by mouth every evening.   potassium chloride 10 MEQ tablet Commonly known as: KLOR-CON Take 10 mEq by mouth every other day.   vitamin C 1000 MG tablet Take 1,000 mg by mouth every evening.   Vitamin D3 1.25 MG (50000 UT) Caps Take 1 capsule by mouth once a week.       Time  coordinating discharge: 35 minutes  The results of significant diagnostics from this hospitalization (including imaging, microbiology, ancillary and laboratory) are listed below for reference.    Procedures and Diagnostic Studies:   Ct Angio Chest Pe W And/or Wo Contrast  Result Date: 04/04/2019 CLINICAL DATA:  Shortness of breath/hypoxia.  Dementia. EXAM: CT ANGIOGRAPHY CHEST WITH CONTRAST TECHNIQUE: Multidetector CT imaging of the chest was performed using the standard protocol during bolus administration of intravenous contrast. Multiplanar CT image reconstructions and MIPs were obtained to evaluate the vascular anatomy. Patient unable to raise arms for exam. CONTRAST:  32mL OMNIPAQUE IOHEXOL 350 MG/ML SOLN COMPARISON:  Chest x-ray 09/29/2017. FINDINGS: Cardiovascular: Mild cardiomegaly. Mild ectasia of the ascending thoracic aorta measuring 3.6 cm. Calcified plaque over the thoracic aorta. Pulmonary arterial system is well opacified. There is a small focal filling defect over the proximal right lower lobar pulmonary artery which may be an acute or chronic embolus. No other pulmonary emboli identified. Tortuosity of the proximal left common carotid artery and right brachiocephalic artery. Remaining vascular structures are unremarkable. Mediastinum/Nodes: No mediastinal or hilar adenopathy. Remaining mediastinal structures are normal. Lungs/Pleura: Lungs are well inflated without focal lobar consolidation. There is dependent atelectasis over the posterior right base.  Subtle reticulonodular density over the right upper lobe which may be acute or chronic and due to atypical infectious or inflammatory disease. Minimal scarring over the right midlung. Mucous plugging over several small airways in the left upper lobe. Couple peripheral subcentimeter dense nodules over the left upper lobe likely benign as the largest measures 2-3 mm. Upper Abdomen: Calcified plaque over the abdominal aorta. No acute findings.  Musculoskeletal: Degenerative change of the spine. Mild to moderate compression fracture over the upper thoracic spine age indeterminate but new since April 2019. Review of the MIP images confirms the above findings. IMPRESSION: 1. Single small embolus over the proximal right lower lobar pulmonary artery which may be acute or chronic. 2. Subtle reticulonodular density over the right upper lobe which may be due to an acute or chronic process of of infectious, atypical infectious or inflammatory nature. Mild right basilar atelectasis. Patchy mucous plugging. Consider follow-up CT 4-6 weeks. 3. Mild ectasia of the ascending thoracic aorta measuring 3.6 cm. Recommend annual imaging followup by CTA or MRA. This recommendation follows 2010 ACCF/AHA/AATS/ACR/ASA/SCA/SCAI/SIR/STS/SVM Guidelines for the Diagnosis and Management of Patients with Thoracic Aortic Disease. Circulation.2010; 121: Z610-R604. Aortic aneurysm NOS (ICD10-I71.9). 4.  Aortic Atherosclerosis (ICD10-I70.0). These results were called by telephone at the time of interpretation on 04/04/2019 at 10:18 am to provider Cathren Laine, MD , who verbally acknowledged these results. Electronically Signed   By: Elberta Fortis M.D.   On: 04/04/2019 10:20   Dg Chest Port 1 View  Result Date: 04/04/2019 CLINICAL DATA:  Hypoxia EXAM: PORTABLE CHEST 1 VIEW COMPARISON:  01/30/2018 FINDINGS: There is mild bilateral chronic interstitial thickening. There is no focal consolidation. There is no pleural effusion or pneumothorax. There is stable cardiomegaly. There is a 2 cm nodular area in the right hilar region similar in appearance to the prior examination of 09/29/2017 which may reflect an enlarged lymph node. There is no acute osseous abnormality. IMPRESSION: No acute cardiopulmonary disease. 2 cm nodular area in the right hilar region similar in appearance to the prior examination of 09/29/2017 which may reflect an enlarged lymph node. If there is further clinical  concern, recommend further characterization with a non emergent CT of the chest. Electronically Signed   By: Elige Ko   On: 04/04/2019 06:58     Labs:   Basic Metabolic Panel: Recent Labs  Lab 04/04/19 0629 04/05/19 0433  NA 145 148*  K 4.3 3.4*  CL 113* 114*  CO2 23 23  GLUCOSE 94 77  BUN 21 19  CREATININE 0.78 0.79  CALCIUM 8.4* 8.6*   GFR Estimated Creatinine Clearance: 29.7 mL/min (by C-G formula based on SCr of 0.79 mg/dL). Liver Function Tests: Recent Labs  Lab 04/04/19 0629  AST 13*  ALT 7  ALKPHOS 69  BILITOT 1.2  PROT 6.1*  ALBUMIN 2.4*   No results for input(s): LIPASE, AMYLASE in the last 168 hours. No results for input(s): AMMONIA in the last 168 hours. Coagulation profile Recent Labs  Lab 04/04/19 0629  INR 1.0    CBC: Recent Labs  Lab 04/04/19 0629 04/05/19 0433 04/06/19 0449  WBC 8.4 8.1 7.6  NEUTROABS 6.0  --   --   HGB 13.7 12.9 11.5*  HCT 44.6 41.1 37.5  MCV 100.7* 99.0 100.3*  PLT 407* 370 354   Cardiac Enzymes: No results for input(s): CKTOTAL, CKMB, CKMBINDEX, TROPONINI in the last 168 hours. BNP: Invalid input(s): POCBNP CBG: No results for input(s): GLUCAP in the last 168 hours. D-Dimer Recent  Labs    04/04/19 0629  DDIMER 3.37*   Hgb A1c No results for input(s): HGBA1C in the last 72 hours. Lipid Profile No results for input(s): CHOL, HDL, LDLCALC, TRIG, CHOLHDL, LDLDIRECT in the last 72 hours. Thyroid function studies No results for input(s): TSH, T4TOTAL, T3FREE, THYROIDAB in the last 72 hours.  Invalid input(s): FREET3 Anemia work up No results for input(s): VITAMINB12, FOLATE, FERRITIN, TIBC, IRON, RETICCTPCT in the last 72 hours. Microbiology Recent Results (from the past 240 hour(s))  Blood Culture (routine x 2)     Status: None (Preliminary result)   Collection Time: 04/04/19  6:11 AM   Specimen: BLOOD  Result Value Ref Range Status   Specimen Description   Final    BLOOD LEFT ARM Performed at  [facility], [address] [place], [place] [address]    Special Requests   Final    BOTTLES DRAWN AEROBIC AND ANAEROBIC Blood Culture results may not be optimal due to an inadequate volume of blood received in culture bottles Performed at [facility], [address], [place], [place] [address]    Culture   Final    NO GROWTH 1 DAY Performed at [facility], [address] [place], [place] [address]    Report Status PENDING  Incomplete  SARS Coronavirus 2 [facility] order, Performed in [facility] hospital lab) Nasopharyngeal Nasopharyngeal Swab     Status: None   Collection Time: 04/04/19  6:13 AM   Specimen: Nasopharyngeal Swab  Result Value Ref Range Status   SARS Coronavirus 2 NEGATIVE NEGATIVE Final    Comment: (NOTE) If result is NEGATIVE SARS-CoV-2 target nucleic acids are NOT DETECTED. The SARS-CoV-2 RNA is generally detectable in upper and lower  respiratory specimens during the acute phase of infection. The lowest  concentration of SARS-CoV-2 viral copies this assay can detect is 250  copies / mL. A negative result does not preclude SARS-CoV-2 infection  and should not be used as the sole basis for treatment or other  patient management decisions.  A negative result may occur with  improper specimen collection / handling, submission of specimen other  than nasopharyngeal swab, presence of viral mutation(s) within the  areas targeted by this assay, and inadequate number of viral copies  (<250 copies / mL). A negative result must be combined with clinical  observations, patient history, and epidemiological information. If result is POSITIVE SARS-CoV-2 target nucleic acids are DETECTED. The SARS-CoV-2 RNA is generally detectable in upper and lower  respiratory specimens dur ing the acute phase of infection.  Positive  results are indicative of active infection with SARS-CoV-2.  Clinical  correlation with patient history and  other diagnostic information is  necessary to determine patient infection status.  Positive results do  not rule out bacterial infection or co-infection with other viruses. If result is PRESUMPTIVE POSTIVE SARS-CoV-2 nucleic acids MAY BE PRESENT.   A presumptive positive result was obtained on the submitted specimen  and confirmed on repeat testing.  While 2019 novel coronavirus  (SARS-CoV-2) nucleic acids may be present in the submitted sample  additional confirmatory testing may be necessary for epidemiological  and / or clinical management purposes  to differentiate between  SARS-CoV-2 and other Sarbecovirus currently known to infect humans.  If clinically indicated additional testing with an alternate test  methodology [phone]) is advised. The SARS-CoV-2 RNA is generally  detectable in upper and lower respiratory sp ecimens during the acute  phase of infection.  The expected result is Negative. Fact Sheet for Patients:  BoilerBrush.com.cy Fact Sheet for Healthcare Providers: https://pope.com/ This test is not yet approved or cleared by the [place] FDA and has been authorized for detection and/or diagnosis of SARS-CoV-2 by FDA under an Emergency Use Authorization (EUA).  This EUA will remain in effect (meaning this test can be used) for the duration of the COVID-19 declaration under Section 564(b)(1) of the Act, 21 U.S.C. section 360bbb-3(b)(1), unless the authorization is terminated or revoked sooner. Performed at [facility], [address], [place], [place] [address]   Blood Culture (routine x 2)     Status: None (Preliminary result)   Collection Time: 04/04/19  6:24 AM   Specimen: BLOOD  Result Value Ref Range Status   Specimen Description   Final    BLOOD SITE NOT SPECIFIED Performed at [facility], [address], [place], [place] [address]    Special Requests   Final    BOTTLES DRAWN  AEROBIC AND ANAEROBIC Blood Culture results may not be optimal due to an inadequate volume of blood received in culture bottles Performed at [facility], [address] [place], [place] [address]    Culture   Final    NO GROWTH 1 DAY Performed at [facility], [address], [place], [place] [address]    Report Status PENDING  Incomplete  MRSA PCR Screening     Status: None   Collection Time: 04/05/19  4:57 AM   Specimen: Nasal Mucosa; Nasopharyngeal  Result Value Ref Range Status   MRSA by PCR NEGATIVE NEGATIVE Final    Comment:        The GeneXpert MRSA Assay (FDA approved for NASAL specimens only), is one component of a comprehensive MRSA colonization surveillance program. It is not intended to diagnose MRSA infection nor to guide or monitor treatment for MRSA infections. Performed at [facility], [address] [place], [place] [address]     Please note: You were cared for by a hospitalist during your hospital stay. Once you are discharged, your primary care physician will handle any further medical issues. Please note that NO REFILLS for any discharge medications will be authorized once you are discharged, as it is imperative that you return to your primary care physician (or establish a relationship with a primary care physician if you do not have one) for your post hospital discharge needs so that they can reassess your need for medications and monitor your lab values.  Signed: Lorin Glass  [facility] 04/06/2019, 9:42 AM

## 2019-04-06 NOTE — TOC Progression Note (Addendum)
Transition of Care [facility]) - Progression Note    Patient Details  Name: Madeline Parrish MRN: [id number] Date of Birth: 1921/07/14  Transition of [facility]) CM/SW Contact  Purcell Mouton, RN Phone Number: 04/06/2019, 10:23 AM  Clinical Narrative:    [facility] and they ar ready to receive pt. [facility] called for 1130 AM pickup, RN is aware. Pt's brother Jerrol Banana was also made aware that pt was returning to [facility].        Expected Discharge Plan and Services           Expected Discharge Date: 04/06/19                                     Social Determinants of Health (SDOH) Interventions    Readmission Risk Interventions No flowsheet data found.

## 2019-04-07 ENCOUNTER — Encounter: Payer: Self-pay | Admitting: Internal Medicine

## 2019-04-07 ENCOUNTER — Non-Acute Institutional Stay (SKILLED_NURSING_FACILITY): Payer: Medicare Other | Admitting: Internal Medicine

## 2019-04-07 DIAGNOSIS — R627 Adult failure to thrive: Secondary | ICD-10-CM

## 2019-04-07 DIAGNOSIS — I2699 Other pulmonary embolism without acute cor pulmonale: Secondary | ICD-10-CM | POA: Diagnosis not present

## 2019-04-07 DIAGNOSIS — N183 Chronic kidney disease, stage 3 unspecified: Secondary | ICD-10-CM

## 2019-04-07 DIAGNOSIS — I5032 Chronic diastolic (congestive) heart failure: Secondary | ICD-10-CM

## 2019-04-07 DIAGNOSIS — I471 Supraventricular tachycardia: Secondary | ICD-10-CM | POA: Diagnosis not present

## 2019-04-07 LAB — CBC AND DIFFERENTIAL
HCT: 36 (ref 36–46)
Hemoglobin: 11.7 — AB (ref 12.0–16.0)
Neutrophils Absolute: 5
Platelets: 333 (ref 150–399)
WBC: 6.8

## 2019-04-07 LAB — BASIC METABOLIC PANEL
BUN: 20 (ref 4–21)
Creatinine: 0.6 (ref 0.5–1.1)
Glucose: 84
Potassium: 3.6 (ref 3.4–5.3)
Sodium: 148 — AB (ref 137–147)

## 2019-04-07 NOTE — Progress Notes (Signed)
Location:  [facility] Room Number: 423-W Place of Service:  SNF [phone]) Provider:  Edmon Crape, PA-C  Margit Hanks, MD  Patient Care Team: Margit Hanks, MD as PCP - General (Internal Medicine)  Extended Emergency Contact Information Primary Emergency Contact: McNeely,Richard Address: [address]          [place], [place] [address] [place] Home Phone: [phone] Relation: Brother  Code Status:  DNR Goals of care: Advanced Directive information Advanced Directives 04/07/2019  Does Patient Have a Medical Advance Directive? Yes  Type of Advance Directive Out of facility DNR (pink MOST or yellow form)  Does patient want to make changes to medical advance directive? No - Patient declined  Would patient like information on creating a medical advance directive? No - Patient declined  Pre-existing out of facility DNR order (yellow form or pink MOST form) Yellow form placed in chart (order not valid for inpatient use)     Chief Complaint  Patient presents with   Hospitalization Follow-up    Patient seen to follow up on hospitalization at [facility] 10/4-10/6/20 for pulmonary embolism    HPI:  Pt is a 85 y.o. female seen today for follow-up of hospitalization with a diagnosis of a pulmonary embolism.  Patient is a long-term resident of the facility with a history of chronic kidney disease as well as dementia and restrictive lung disease supraventricular tachycardia as well as recent diagnosis of: COVID-19 which she appears to have recovered from.  Patient was noted to have hypoxia in the facility despite being put on oxygen and she was sent to the ER where work-up revealed a small embolus in the proximal right lower lobe which might have been acute versus chronic.  She was started on Lovenox and has been transitioned to Eliquis 5 mg twice daily.  CT of the chest also showed a subtle retrovascular nodule in the right upper lobe- she  did not have an elevated white count or fever or antibiotics which have been started were stopped.  In regards to failure to thrive she continues to be cachectic  dietary did evaluate her and it was thought hospice appeared to be appropriate--.  She did have some lower extremity edema more so on the right foot in the hospital- with contractures.  It was thought she possibly could have a right lower extremity DVT but it was thought a Doppler study would have been difficult- and any further work-up would not really change management at this point.  Regards to supraventricular tachycardia her rhythm remains controlled.  Her renal function also appear to be stable  Her potassium was slightly low at 3.4 on October 5-sodium was also slightly elevated at 148  Currently she is resting in bed comfortably she is pleasantly confused but bright and alert talking more than I have seen her talk recently-vital signs are stable   Past Medical History:  Diagnosis Date   Acne rosacea    Acute pulmonary embolism (HCC) 04/04/2019   CKD (chronic kidney disease), stage III    Depression    Hypertension    Osteopenia    Pneumonia due to COVID-19 virus 01/13/2019   PUD (peptic ulcer disease)    Restrictive lung disease    Shingles    Tinnitus    and vertigo   Past Surgical History:  Procedure Laterality Date   None      No Known Allergies  Outpatient Encounter Medications as of 04/07/2019  Medication Sig   apixaban (ELIQUIS) 5 MG TABS tablet Take 1 tablet (5 mg total) by mouth 2 (two) times daily.   Ascorbic Acid (VITAMIN C) 1000 MG tablet Take 1,000 mg by mouth every evening.    cetaphil (CETAPHIL) lotion Apply 1 application topically at bedtime. Apply to bilateral lower extremities   Cholecalciferol (VITAMIN D3) 1.25 MG (50000 UT) CAPS Take 1 capsule by mouth once a week.   Ensure (ENSURE) Take 237 mLs by mouth 3 (three) times daily.   metoprolol succinate (TOPROL-XL) 25 MG 24  hr tablet Take 25 mg by mouth daily.    polyethylene glycol (MIRALAX / GLYCOLAX) packet Take 17 g by mouth every evening.    potassium chloride (K-DUR,KLOR-CON) 10 MEQ tablet Take 10 mEq by mouth every other day.    No facility-administered encounter medications on file as of 04/07/2019.     Review of Systems   Review of systems essentially unobtainable secondary to dementia nursing does not report any acute issues feels she is essentially at her baseline  Immunization History  Administered Date(s) Administered   Influenza, High Dose Seasonal PF 03/31/2017   Influenza-Unspecified 03/17/2018, 04/01/2019   Pertinent  Health Maintenance Due  Topic Date Due   INFLUENZA VACCINE  Completed   DEXA SCAN  Discontinued   PNA vac Low Risk Adult  Discontinued   No flowsheet data found. Functional Status Survey:    Vitals:   04/07/19 0946  BP: 125/82  Pulse: 82  Resp: 18  Temp: 98.4 F (36.9 C)  TempSrc: Oral  SpO2: 98%  Weight: 106 lb 8 oz (48.3 kg)  Height: 5\' 4"  (1.626 m)   Body mass index is 18.28 kg/m. Physical Exam General this is a frail elderly female in no distress she is alert and talking today   Her skin is warm and dry she does have a patch on her back which is followed by wound care- I did not note any increased bruising or bleeding.  Eyes visual acuity appears to be intact sclera and conjunctival are clear.  Oropharynx is clear was difficult to fully assess since she did not open her mouth very wide.  Chest has shallow air entry but could not appreciate any labored breathing or significant congestion.  Heart is regular rate and rhythm with at times irregular beat underlying rhythm appears to be around 80 bpm- she does have some mild pedal edema a bit more on the right versus the left this is more in the heel area.  Abdomen is soft nontender with active bowel sounds.  Musculoskeletal does hold her lower extremities in somewhat of a contracted position  bilaterally does move her extremities upper at baseline she does have general frailty  Neurologic as noted above she is alert could not really appreciate lateralizing findings.  Psych she did at times follow simple verbal commands but this was spotty she is alert pleasant-talking more than  when I last saw her   labs reviewed: Recent Labs    01/14/19 04/04/19 0629 04/05/19 0433  NA 140 145 148*  K 4.0 4.3 3.4*  CL  --  113* 114*  CO2  --  23 23  GLUCOSE  --  94 77  BUN 19 21 19   CREATININE 0.9 0.78 0.79  CALCIUM  --  8.4* 8.6*   Recent Labs    04/04/19 0629  AST 13*  ALT 7  ALKPHOS 69  BILITOT 1.2  PROT 6.1*  ALBUMIN 2.4*   Recent Labs  01/13/19 04/04/19 0629 04/05/19 0433 04/06/19 0449  WBC 6.9 8.4 8.1 7.6  NEUTROABS 5 6.0  --   --   HGB 15.1 13.7 12.9 11.5*  HCT 45 44.6 41.1 37.5  MCV  --  100.7* 99.0 100.3*  PLT 129* 407* 370 354   Lab Results  Component Value Date   TSH 4.553 (H) 01/30/2018   No results found for: HGBA1C Lab Results  Component Value Date   CHOL 167 08/15/2015   HDL 65 08/15/2015   LDLCALC 87 08/15/2015   TRIG 76 08/15/2015   CHOLHDL 2.6 08/15/2015    Significant Diagnostic Results in last 30 days:  Ct Angio Chest Pe W And/or Wo Contrast  Result Date: 04/04/2019 CLINICAL DATA:  Shortness of breath/hypoxia.  Dementia. EXAM: CT ANGIOGRAPHY CHEST WITH CONTRAST TECHNIQUE: Multidetector CT imaging of the chest was performed using the standard protocol during bolus administration of intravenous contrast. Multiplanar CT image reconstructions and MIPs were obtained to evaluate the vascular anatomy. Patient unable to raise arms for exam. CONTRAST:  80mL OMNIPAQUE IOHEXOL 350 MG/ML SOLN COMPARISON:  Chest x-ray 09/29/2017. FINDINGS: Cardiovascular: Mild cardiomegaly. Mild ectasia of the ascending thoracic aorta measuring 3.6 cm. Calcified plaque over the thoracic aorta. Pulmonary arterial system is well opacified. There is a small focal filling  defect over the proximal right lower lobar pulmonary artery which may be an acute or chronic embolus. No other pulmonary emboli identified. Tortuosity of the proximal left common carotid artery and right brachiocephalic artery. Remaining vascular structures are unremarkable. Mediastinum/Nodes: No mediastinal or hilar adenopathy. Remaining mediastinal structures are normal. Lungs/Pleura: Lungs are well inflated without focal lobar consolidation. There is dependent atelectasis over the posterior right base. Subtle reticulonodular density over the right upper lobe which may be acute or chronic and due to atypical infectious or inflammatory disease. Minimal scarring over the right midlung. Mucous plugging over several small airways in the left upper lobe. Couple peripheral subcentimeter dense nodules over the left upper lobe likely benign as the largest measures 2-3 mm. Upper Abdomen: Calcified plaque over the abdominal aorta. No acute findings. Musculoskeletal: Degenerative change of the spine. Mild to moderate compression fracture over the upper thoracic spine age indeterminate but new since April 2019. Review of the MIP images confirms the above findings. IMPRESSION: 1. Single small embolus over the proximal right lower lobar pulmonary artery which may be acute or chronic. 2. Subtle reticulonodular density over the right upper lobe which may be due to an acute or chronic process of of infectious, atypical infectious or inflammatory nature. Mild right basilar atelectasis. Patchy mucous plugging. Consider follow-up CT 4-6 weeks. 3. Mild ectasia of the ascending thoracic aorta measuring 3.6 cm. Recommend annual imaging followup by CTA or MRA. This recommendation follows 2010 ACCF/AHA/AATS/ACR/ASA/SCA/SCAI/SIR/STS/SVM Guidelines for the Diagnosis and Management of Patients with Thoracic Aortic Disease. Circulation.2010; 121: U981-X914: E266-e369. Aortic aneurysm NOS (ICD10-I71.9). 4.  Aortic Atherosclerosis (ICD10-I70.0). These  results were called by telephone at the time of interpretation on 04/04/2019 at 10:18 am to provider Cathren LaineKevin Steinl, MD , who verbally acknowledged these results. Electronically Signed   By: Elberta Fortisaniel  Boyle M.D.   On: 04/04/2019 10:20   Dg Chest Port 1 View  Result Date: 04/04/2019 CLINICAL DATA:  Hypoxia EXAM: PORTABLE CHEST 1 VIEW COMPARISON:  01/30/2018 FINDINGS: There is mild bilateral chronic interstitial thickening. There is no focal consolidation. There is no pleural effusion or pneumothorax. There is stable cardiomegaly. There is a 2 cm nodular area in the right hilar region similar in  appearance to the prior examination of 09/29/2017 which may reflect an enlarged lymph node. There is no acute osseous abnormality. IMPRESSION: No acute cardiopulmonary disease. 2 cm nodular area in the right hilar region similar in appearance to the prior examination of 09/29/2017 which may reflect an enlarged lymph node. If there is further clinical concern, recommend further characterization with a non emergent CT of the chest. Electronically Signed   By: Elige Ko   On: 04/04/2019 06:58    Assessment/Plan  #1 history of pulmonary embolism-acute versus chronic-she is now on Eliquis 5 mg twice daily-monitor- currently appears to be at her baseline nursing does not report any concerns of shortness of breath she actually appears a bit more energetic than when I last saw her.  2.  History of rectovesicular nodule right upper lobe- she was afebrile in the hospital and did not have an elevated white count her antibiotics were stopped at this point monitor but respiratory wise appears to be at her baseline.  3.  History of failure to thrive she is on supplements including Ensure there is recommendation to possibly pursue hospice consult and will order that.  4.  History of lower extremity edema- at this point appears relatively baseline..  She does have history of CHF and this will need to be monitored but clinically  appears to be at her baseline and the edema is not increased from baseline.  5.  History of supraventricular tachycardia she continues on Toprol-rate appears to be controlled she does have some irregular beats but is not tachycardic.  6.  History of chronic kidney disease this appears stable and creatinine is 0.79 BUN of 19 on labs n hospital.  7.  History of mild hypokalemia she is on low-dose potassium update lab has been ordered for first laboratory day next week.  8.  History of  hypernatremia-fluids will have to be encouraged did write  an order for such this will have to be watched however with her history of CHF-update labs have been ordered as noted-clinically she appears to be doing okay. Will order 120 cc fluid every 2 hours 8 times a day thru Sunday--this was discussed with Dr. [name] via phone  9.-Dementia this appears to be quite significant with failure to thrive issues will order hospice consult and continue comfort measures.  [id number]-greater than 35 minutes spent assessing patient-reviewing her chart and labs discussing her status with nursing staff-coordinating and formulating a plan of care for numerous diagnoses-of note greater than 50% of time spent coordinating plan of care with input as noted above  Addendum-of note labs drawn this morning she will stability of renal function with a creatinine of 0.62 BUN of 20.3 potassium 3.6 sodium is stillelevated at 148 again fluids will have to be encouraged and update labs next week.  Hemoglobin is 11.7 white count is normal at 6.8 platelets are 333

## 2019-04-09 ENCOUNTER — Encounter: Payer: Self-pay | Admitting: Internal Medicine

## 2019-04-09 ENCOUNTER — Non-Acute Institutional Stay (SKILLED_NURSING_FACILITY): Payer: Medicare Other | Admitting: Internal Medicine

## 2019-04-09 DIAGNOSIS — E559 Vitamin D deficiency, unspecified: Secondary | ICD-10-CM

## 2019-04-09 DIAGNOSIS — K5909 Other constipation: Secondary | ICD-10-CM | POA: Diagnosis not present

## 2019-04-09 DIAGNOSIS — J9601 Acute respiratory failure with hypoxia: Secondary | ICD-10-CM

## 2019-04-09 DIAGNOSIS — I10 Essential (primary) hypertension: Secondary | ICD-10-CM

## 2019-04-09 DIAGNOSIS — I471 Supraventricular tachycardia: Secondary | ICD-10-CM

## 2019-04-09 DIAGNOSIS — I2699 Other pulmonary embolism without acute cor pulmonale: Secondary | ICD-10-CM

## 2019-04-09 LAB — CULTURE, BLOOD (ROUTINE X 2)
Culture: NO GROWTH
Culture: NO GROWTH

## 2019-04-09 LAB — URINE CULTURE: Culture: >=100000 — AB

## 2019-04-09 NOTE — Progress Notes (Signed)
: Provider:  Hennie Duos., MD Location:  [place] Room Number: 423-W Place of Service:  SNF [phone])  PCP: Hennie Duos, MD Patient Care Team: Hennie Duos, MD as PCP - General (Internal Medicine)  Extended Emergency Contact Information Primary Emergency Contact: McNeely,Richard Address: [address]          [place], [place] [address] [place] Phone: [phone] Relation: Brother     Allergies: Patient has no known allergies.  Chief Complaint  Patient presents with   Readmit To SNF    Readmission to [facility]    HPI: Patient is a 85 y.o. female with history of COVID-19 pneumonia January 13, 2019, CKD stage III, dementia, restrictive lung disease, paroxysmal SVT, who presented to [facility] from SNF with new onset hypoxia.  Patient had gotten a flu shot on 10/1 and because of that had been getting every shift vital signs and on the third shift it was found that her O2 saturation was low.  Patient was placed on 2 L O2 and O2 saturations did not reach 90% and she was sent to the emergency department.  In the ED patient's O2 saturation was found to be 83% on room air and ABG showed hypoxemia with a O2 of 54 on 4 L O2..  CT angiogram of the chest showed a small embolus proximal right lower lobe.  Patient is admitted to [facility] from 10/4-6 where she was started on Eliquis 5 mg twice daily for pulmonary embolus.  Patient did have an abnormal chest imaging, CT findings of subtle rectovesicular nodule density right upper lobe with patchy mucous plugging may be contributing, no leukocytosis or fever and procalcitonin was negative so antibiotics were stopped.  Patient was found to have right foot edema so given PE it was ultrasounded and it was negative for blood clot.  All other problems being stable patient is admitted back to skilled nursing facility for residential care.  Hospice was present recommended.   While at SNF patient will be followed for hypertension treated with Toprol-XL, vitamin D deficiency treated with replacement and constipation treated with MiraLAX.  Past Medical History:  Diagnosis Date   Acne rosacea    Acute pulmonary embolism ([place]) 04/04/2019   CKD (chronic kidney disease), stage III    Depression    Hypertension    Osteopenia    Pneumonia due to COVID-19 virus 01/13/2019   PUD (peptic ulcer disease)    Restrictive lung disease    Shingles    Tinnitus    and vertigo    Past Surgical History:  Procedure Laterality Date   None      Allergies as of 04/09/2019   No Known Allergies     Medication List       Accurate as of April 09, 2019 10:38 AM. If you have any questions, ask your nurse or doctor.        apixaban 5 MG Tabs tablet Commonly known as: ELIQUIS Take 1 tablet (5 mg total) by mouth 2 (two) times daily.   cetaphil lotion Apply 1 application topically at bedtime. Apply to bilateral lower extremities   Ensure Take 237 mLs by mouth 3 (three) times daily.   metoprolol succinate 25 MG 24 hr tablet Commonly known as: TOPROL-XL Take 25 mg by mouth daily.   polyethylene glycol 17 g packet Commonly known as: MIRALAX / GLYCOLAX Take 17 g by mouth every evening.  potassium chloride 10 MEQ tablet Commonly known as: KLOR-CON Take 10 mEq by mouth every other day.   vitamin C 1000 MG tablet Take 1,000 mg by mouth every evening.   Vitamin D3 1.25 MG (50000 UT) Caps Take 1 capsule by mouth once a week.       No orders of the defined types were placed in this encounter.   Immunization History  Administered Date(s) Administered   Influenza, High Dose Seasonal PF 03/31/2017   Influenza-Unspecified 03/17/2018, 04/01/2019    Social History   Tobacco Use   Smoking status: Never Smoker   Smokeless tobacco: Never Used  Substance Use Topics   Alcohol use: No    Family history is   Family History  Problem Relation  Age of Onset   CVA Mother       Review of Systems  DATA OBTAINED: from patient-extremely limited, can nod yes or no; nursing-no acute concerns  GENERAL:  no fevers, fatigue, appetite changes SKIN: No itching, or rash EYES: No eye pain, redness, discharge EARS: No earache, tinnitus, change in hearing NOSE: No congestion, drainage or bleeding  MOUTH/THROAT: No mouth or tooth pain, No sore throat RESPIRATORY: No cough, wheezing, SOB CARDIAC: No chest pain, palpitations, lower extremity edema  GI: No abdominal pain, No N/V/D or constipation, No heartburn or reflux  GU: No dysuria, frequency or urgency, or incontinence  MUSCULOSKELETAL: No unrelieved bone/joint pain NEUROLOGIC: No headache, dizziness or focal weakness PSYCHIATRIC: No c/o anxiety or sadness   Vitals:   04/09/19 1031  BP: 90/62  Pulse: 92  Resp: 18  Temp: (!) 97.3 F (36.3 C)  SpO2: 98%    SpO2 Readings from Last 1 Encounters:  04/09/19 98%   Body mass index is 15.98 kg/m.     Physical Exam  GENERAL APPEARANCE: Alert, non-conversant,  No acute distress.  SKIN: No diaphoresis rash HEAD: Normocephalic, atraumatic  EYES: Conjunctiva/lids clear. Pupils round, reactive. EOMs intact.  EARS: External exam WNL, canals clear. Hearing grossly normal.  NOSE: No deformity or discharge.  MOUTH/THROAT: Lips w/o lesions  RESPIRATORY: Breathing is even, unlabored. Lung sounds are clear   CARDIOVASCULAR: Heart RRR no murmurs, rubs or gallops. No peripheral edema.   GASTROINTESTINAL: Abdomen is soft, non-tender, not distended w/ normal bowel sounds. GENITOURINARY: Bladder non tender, not distended  MUSCULOSKELETAL: No abnormal joints or musculature NEUROLOGIC:  Cranial nerves 2-12 grossly intact. Moves all extremities  PSYCHIATRIC: Dementia, no behavioral issues  Patient Active Problem List   Diagnosis Date Noted   Pressure injury of skin 04/06/2019   Acute pulmonary embolism (HCC) 04/04/2019   Acute hypoxemic  respiratory failure (HCC) 04/04/2019   Idiopathic pulmonary fibrosis (HCC) 04/04/2019   Vitamin D deficiency 03/19/2019   Real time reverse transcriptase PCR positive for COVID-19 virus 02/13/2019   Poor fluid intake 02/13/2019   Alteration in cognition 02/13/2019   Urinary tract infection due to Proteus 01/17/2019   Pneumonia due to COVID-19 virus 01/13/2019   FTT (failure to thrive) in adult 10/05/2018   Chronic constipation 10/05/2018   Cellulitis of left lower extremity 07/07/2018   Pneumonia of lower lobe of lung 02/09/2018   Delirium 02/09/2018   Chronic diastolic (congestive) heart failure (HCC) 02/09/2018   Hematoma of left lower extremity 02/09/2018   Acute on chronic diastolic CHF (congestive heart failure) (HCC) 01/30/2018   Sepsis due to pneumonia (HCC) 01/30/2018   Agitation 01/30/2018   Right leg injury, subsequent encounter 01/30/2018   Benign hypertension with chronic kidney disease, stage  III 10/06/2017   CKD (chronic kidney disease), stage III 10/06/2017   Restrictive airway disease 10/06/2017   Fall 09/29/2017   Acute renal failure superimposed on stage 3 chronic kidney disease (HCC) 09/29/2017   Bilateral lower extremity edema 08/15/2015   Paroxysmal supraventricular tachycardia (HCC) 12/06/2013   Essential hypertension, benign 12/06/2013   Syncope and collapse 12/06/2013      Labs reviewed: Basic Metabolic Panel:    Component Value Date/Time   NA 148 (H) 04/05/2019 0433   NA 141 03/16/2019   K 3.4 (L) 04/05/2019 0433   CL 114 (H) 04/05/2019 0433   CO2 23 04/05/2019 0433   GLUCOSE 77 04/05/2019 0433   BUN 19 04/05/2019 0433   BUN 12 03/16/2019   CREATININE 0.79 04/05/2019 0433   CREATININE 1.07 (H) 08/15/2015 1422   CALCIUM 8.6 (L) 04/05/2019 0433   PROT 6.1 (L) 04/04/2019 0629   ALBUMIN 2.4 (L) 04/04/2019 0629   AST 13 (L) 04/04/2019 0629   ALT 7 04/04/2019 0629   ALKPHOS 69 04/04/2019 0629   BILITOT 1.2 04/04/2019  0629   GFRNONAA >60 04/05/2019 0433   GFRAA >60 04/05/2019 0433    Recent Labs    03/16/19 04/04/19 0629 04/05/19 0433  NA 141 145 148*  K 4.0 4.3 3.4*  CL  --  113* 114*  CO2  --  23 23  GLUCOSE  --  94 77  BUN 12 21 19   CREATININE 0.6 0.78 0.79  CALCIUM  --  8.4* 8.6*   Liver Function Tests: Recent Labs    04/04/19 0629  AST 13*  ALT 7  ALKPHOS 69  BILITOT 1.2  PROT 6.1*  ALBUMIN 2.4*   No results for input(s): LIPASE, AMYLASE in the last 8760 hours. No results for input(s): AMMONIA in the last 8760 hours. CBC: Recent Labs    01/13/19 04/04/19 0629 04/05/19 0433 04/06/19 0449  WBC 6.9 8.4 8.1 7.6  NEUTROABS 5 6.0  --   --   HGB 15.1 13.7 12.9 11.5*  HCT 45 44.6 41.1 37.5  MCV  --  100.7* 99.0 100.3*  PLT 129* 407* 370 354   Lipid No results for input(s): CHOL, HDL, LDLCALC, TRIG in the last 8760 hours.  Cardiac Enzymes: No results for input(s): CKTOTAL, CKMB, CKMBINDEX, TROPONINI in the last 8760 hours. BNP: Recent Labs    04/04/19 0629  BNP 183.9*   No results found for: MICROALBUR No results found for: HGBA1C Lab Results  Component Value Date   TSH 4.553 (H) 01/30/2018   No results found for: VITAMINB12 No results found for: FOLATE No results found for: IRON, TIBC, FERRITIN  Imaging and Procedures obtained prior to SNF admission: Ct Angio Chest Pe W And/or Wo Contrast  Result Date: 04/04/2019 CLINICAL DATA:  Shortness of breath/hypoxia.  Dementia. EXAM: CT ANGIOGRAPHY CHEST WITH CONTRAST TECHNIQUE: Multidetector CT imaging of the chest was performed using the standard protocol during bolus administration of intravenous contrast. Multiplanar CT image reconstructions and MIPs were obtained to evaluate the vascular anatomy. Patient unable to raise arms for exam. CONTRAST:  1mL OMNIPAQUE IOHEXOL 350 MG/ML SOLN COMPARISON:  Chest x-ray 09/29/2017. FINDINGS: Cardiovascular: Mild cardiomegaly. Mild ectasia of the ascending thoracic aorta measuring 3.6  cm. Calcified plaque over the thoracic aorta. Pulmonary arterial system is well opacified. There is a small focal filling defect over the proximal right lower lobar pulmonary artery which may be an acute or chronic embolus. No other pulmonary emboli identified. Tortuosity of the proximal left  common carotid artery and right brachiocephalic artery. Remaining vascular structures are unremarkable. Mediastinum/Nodes: No mediastinal or hilar adenopathy. Remaining mediastinal structures are normal. Lungs/Pleura: Lungs are well inflated without focal lobar consolidation. There is dependent atelectasis over the posterior right base. Subtle reticulonodular density over the right upper lobe which may be acute or chronic and due to atypical infectious or inflammatory disease. Minimal scarring over the right midlung. Mucous plugging over several small airways in the left upper lobe. Couple peripheral subcentimeter dense nodules over the left upper lobe likely benign as the largest measures 2-3 mm. Upper Abdomen: Calcified plaque over the abdominal aorta. No acute findings. Musculoskeletal: Degenerative change of the spine. Mild to moderate compression fracture over the upper thoracic spine age indeterminate but new since April 2019. Review of the MIP images confirms the above findings. IMPRESSION: 1. Single small embolus over the proximal right lower lobar pulmonary artery which may be acute or chronic. 2. Subtle reticulonodular density over the right upper lobe which may be due to an acute or chronic process of of infectious, atypical infectious or inflammatory nature. Mild right basilar atelectasis. Patchy mucous plugging. Consider follow-up CT 4-6 weeks. 3. Mild ectasia of the ascending thoracic aorta measuring 3.6 cm. Recommend annual imaging followup by CTA or MRA. This recommendation follows 2010 ACCF/AHA/AATS/ACR/ASA/SCA/SCAI/SIR/STS/SVM Guidelines for the Diagnosis and Management of Patients with Thoracic Aortic  Disease. Circulation.2010; 121: Z610-R604. Aortic aneurysm NOS (ICD10-I71.9). 4.  Aortic Atherosclerosis (ICD10-I70.0). These results were called by telephone at the time of interpretation on 04/04/2019 at 10:18 am to provider Cathren Laine, MD , who verbally acknowledged these results. Electronically Signed   By: Elberta Fortis M.D.   On: 04/04/2019 10:20   Dg Chest Port 1 View  Result Date: 04/04/2019 CLINICAL DATA:  Hypoxia EXAM: PORTABLE CHEST 1 VIEW COMPARISON:  01/30/2018 FINDINGS: There is mild bilateral chronic interstitial thickening. There is no focal consolidation. There is no pleural effusion or pneumothorax. There is stable cardiomegaly. There is a 2 cm nodular area in the right hilar region similar in appearance to the prior examination of 09/29/2017 which may reflect an enlarged lymph node. There is no acute osseous abnormality. IMPRESSION: No acute cardiopulmonary disease. 2 cm nodular area in the right hilar region similar in appearance to the prior examination of 09/29/2017 which may reflect an enlarged lymph node. If there is further clinical concern, recommend further characterization with a non emergent CT of the chest. Electronically Signed   By: Elige Ko   On: 04/04/2019 06:58     Not all labs, radiology exams or other studies done during hospitalization come through on my EPIC note; however they are reviewed by me.    Assessment and Plan  Pulmonary embolism right lower lobe/acute respiratory failure with hypoxia secondary to initially started on full dose Lovenox then switched to Eliquis 5 mg twice daily; patient had right foot edema so given she had a PE Doppler was done and it was negative SNF-admitted back for residential care; continue Eliquis 5 mg twice daily  Paroxysmal SVT/hypertension SNF-continue Toprol-XL 25 mg daily and patient is now on Eliquis  Vitamin D deficiency SNF- continue vitamin D replacement 50,000 units weekly  Constipation SNF-stable; continue  MiraLAX 17 g daily  Time spent greater than 35 minutes;> 50% of time with patient was spent reviewing records, labs, tests and studies, counseling and developing plan of care  Margit Hanks, MD

## 2019-04-10 ENCOUNTER — Encounter: Payer: Self-pay | Admitting: Internal Medicine

## 2019-04-13 LAB — BASIC METABOLIC PANEL
BUN: 20 (ref 4–21)
Creatinine: 0.9 (ref 0.5–1.1)
Glucose: 78
Potassium: 4.2 (ref 3.4–5.3)
Sodium: 144 (ref 137–147)

## 2019-04-13 LAB — CBC AND DIFFERENTIAL
HCT: 35 — AB (ref 36–46)
Hemoglobin: 11.7 — AB (ref 12.0–16.0)
Neutrophils Absolute: 4
Platelets: 376 (ref 150–399)
WBC: 6.1

## 2019-04-16 LAB — VITAMIN D 25 HYDROXY (VIT D DEFICIENCY, FRACTURES): Vit D, 25-Hydroxy: 28.2

## 2019-04-20 ENCOUNTER — Non-Acute Institutional Stay (SKILLED_NURSING_FACILITY): Payer: Medicare Other | Admitting: Internal Medicine

## 2019-04-20 DIAGNOSIS — F0390 Unspecified dementia without behavioral disturbance: Secondary | ICD-10-CM | POA: Diagnosis not present

## 2019-04-20 DIAGNOSIS — Z8619 Personal history of other infectious and parasitic diseases: Secondary | ICD-10-CM | POA: Diagnosis not present

## 2019-04-20 DIAGNOSIS — Z8616 Personal history of COVID-19: Secondary | ICD-10-CM

## 2019-04-26 ENCOUNTER — Encounter: Payer: Self-pay | Admitting: Internal Medicine

## 2019-04-26 NOTE — Progress Notes (Signed)
[facility]   Place of Service:   SNF  Lyn HollingsheadAlexander, Randon GoldsmithAnne D, MD  Patient Care Team: Margit HanksAlexander, Calyn Sivils D, MD as PCP - General (Internal Medicine)  Extended Emergency Contact Information Primary Emergency Contact: McNeely,Richard Address: [address]          [place], [place] [id number] [place] Home Phone: [phone] Relation: Brother    Allergies: Patient has no known allergies.  Chief Complaint  Patient presents with   Acute Visit    HPI: Patient is 85 y.o. female who is being seen in follow-up to last week's visit.  Patient looks much better today, she just been moved out of quarantine and for the first time I see her sitting up in her wheelchair.  This is the first time I have seen her sitting up and dressed since she began her journey with COVID-19.  Past Medical History:  Diagnosis Date   Acne rosacea    Acute pulmonary embolism (HCC) 04/04/2019   CKD (chronic kidney disease), stage III    Depression    Hypertension    Osteopenia    Pneumonia due to COVID-19 virus 01/13/2019   PUD (peptic ulcer disease)    Restrictive lung disease    Shingles    Tinnitus    and vertigo    Past Surgical History:  Procedure Laterality Date   None      Allergies as of 04/20/2019   No Known Allergies     Medication List       Accurate as of April 20, 2019 11:59 PM. If you have any questions, ask your nurse or doctor.        apixaban 5 MG Tabs tablet Commonly known as: ELIQUIS Take 1 tablet (5 mg total) by mouth 2 (two) times daily.   cetaphil lotion Apply 1 application topically at bedtime. Apply to bilateral lower extremities   Ensure Take 237 mLs by mouth 3 (three) times daily.   metoprolol succinate 25 MG 24 hr tablet Commonly known as: TOPROL-XL Take 25 mg by mouth daily.   polyethylene glycol 17 g packet Commonly known as: MIRALAX / GLYCOLAX Take 17 g by mouth every evening.   potassium chloride 10 MEQ  tablet Commonly known as: KLOR-CON Take 10 mEq by mouth every other day.   vitamin C 1000 MG tablet Take 1,000 mg by mouth every evening.   Vitamin D3 1.25 MG (50000 UT) Caps Take 1 capsule by mouth once a week.       No orders of the defined types were placed in this encounter.   Immunization History  Administered Date(s) Administered   Influenza, High Dose Seasonal PF 03/31/2017   Influenza-Unspecified 03/17/2018, 04/01/2019    Social History   Tobacco Use   Smoking status: Never Smoker   Smokeless tobacco: Never Used  Substance Use Topics   Alcohol use: No    Review of Systems    unable to obtain secondary to dementia; nursing-no acute concerns    Vitals:   04/26/19 2029  BP: 132/82  Pulse: 80  Resp: 18  Temp: 98.3 F (36.8 C)   There is no height or weight on file to calculate BMI. Physical Exam  GENERAL APPEARANCE: Alert, conversant, No acute distress  SKIN: No diaphoresis rash HEENT: Unremarkable RESPIRATORY: Breathing is even, unlabored. Lung sounds are clear   CARDIOVASCULAR: Heart RRR no murmurs, rubs or gallops. No peripheral edema  GASTROINTESTINAL: Abdomen is soft, non-tender, not distended w/ normal bowel  sounds.  GENITOURINARY: Bladder non tender, not distended  MUSCULOSKELETAL: No abnormal joints or musculature NEUROLOGIC: Cranial nerves 2-12 grossly intact. Moves all extremities PSYCHIATRIC: Patient had dementia before but she has definitely taken a cognitive hit since she had Covid;, no behavioral issues  Patient Active Problem List   Diagnosis Date Noted   Pressure injury of skin 04/06/2019   Acute pulmonary embolism (HCC) 04/04/2019   Acute hypoxemic respiratory failure (HCC) 04/04/2019   Idiopathic pulmonary fibrosis (HCC) 04/04/2019   Vitamin D deficiency 03/19/2019   Real time reverse transcriptase PCR positive for COVID-19 virus 02/13/2019   Poor fluid intake 02/13/2019   Alteration in cognition 02/13/2019    Urinary tract infection due to Proteus 01/17/2019   Pneumonia due to COVID-19 virus 01/13/2019   FTT (failure to thrive) in adult 10/05/2018   Chronic constipation 10/05/2018   Cellulitis of left lower extremity 07/07/2018   Pneumonia of lower lobe of lung 02/09/2018   Delirium 02/09/2018   Chronic diastolic (congestive) heart failure (HCC) 02/09/2018   Hematoma of left lower extremity 02/09/2018   Acute on chronic diastolic CHF (congestive heart failure) (HCC) 01/30/2018   Sepsis due to pneumonia (HCC) 01/30/2018   Agitation 01/30/2018   Right leg injury, subsequent encounter 01/30/2018   Benign hypertension with chronic kidney disease, stage III 10/06/2017   CKD (chronic kidney disease), stage III 10/06/2017   Restrictive airway disease 10/06/2017   Fall 09/29/2017   Acute renal failure superimposed on stage 3 chronic kidney disease (HCC) 09/29/2017   Bilateral lower extremity edema 08/15/2015   Paroxysmal supraventricular tachycardia (HCC) 12/06/2013   Essential hypertension, benign 12/06/2013   Syncope and collapse 12/06/2013    CMP     Component Value Date/Time   NA 148 (H) 04/05/2019 0433   NA 141 03/16/2019   K 3.4 (L) 04/05/2019 0433   CL 114 (H) 04/05/2019 0433   CO2 23 04/05/2019 0433   GLUCOSE 77 04/05/2019 0433   BUN 19 04/05/2019 0433   BUN 12 03/16/2019   CREATININE 0.79 04/05/2019 0433   CREATININE 1.07 (H) 08/15/2015 1422   CALCIUM 8.6 (L) 04/05/2019 0433   PROT 6.1 (L) 04/04/2019 0629   ALBUMIN 2.4 (L) 04/04/2019 0629   AST 13 (L) 04/04/2019 0629   ALT 7 04/04/2019 0629   ALKPHOS 69 04/04/2019 0629   BILITOT 1.2 04/04/2019 0629   GFRNONAA >60 04/05/2019 0433   GFRAA >60 04/05/2019 0433   Recent Labs    03/16/19 04/04/19 0629 04/05/19 0433  NA 141 145 148*  K 4.0 4.3 3.4*  CL  --  113* 114*  CO2  --  23 23  GLUCOSE  --  94 77  BUN CREATININE 0.6 0.78 0.79  CALCIUM  --  8.4* 8.6*   Recent Labs    04/04/19 0629   AST 13*  ALT 7  ALKPHOS 69  BILITOT 1.2  PROT 6.1*  ALBUMIN 2.4*   Recent Labs    01/13/19 04/04/19 0629 04/05/19 0433 04/06/19 0449  WBC 6.9 8.4 8.1 7.6  NEUTROABS 5 6.0  --   --   HGB 15.1 13.7 12.9 11.5*  HCT 45 44.6 41.1 37.5  MCV  --  100.7* 99.0 100.3*  PLT 129* 407* 370 354   No results for input(s): CHOL, LDLCALC, TRIG in the last 8760 hours.  Invalid input(s): HCL No results found for: [facility] Lab Results  Component Value Date   TSH 4.553 (H) 01/30/2018   No results found  for: HGBA1C Lab Results  Component Value Date   CHOL 167 08/15/2015   HDL 65 08/15/2015   LDLCALC 87 08/15/2015   TRIG 76 08/15/2015   CHOLHDL 2.6 08/15/2015    Significant Diagnostic Results in last 30 days:  Ct Angio Chest Pe W And/or Wo Contrast  Result Date: 04/04/2019 CLINICAL DATA:  Shortness of breath/hypoxia.  Dementia. EXAM: CT ANGIOGRAPHY CHEST WITH CONTRAST TECHNIQUE: Multidetector CT imaging of the chest was performed using the standard protocol during bolus administration of intravenous contrast. Multiplanar CT image reconstructions and MIPs were obtained to evaluate the vascular anatomy. Patient unable to raise arms for exam. CONTRAST:  48mL OMNIPAQUE IOHEXOL 350 MG/ML SOLN COMPARISON:  Chest x-ray 09/29/2017. FINDINGS: Cardiovascular: Mild cardiomegaly. Mild ectasia of the ascending thoracic aorta measuring 3.6 cm. Calcified plaque over the thoracic aorta. Pulmonary arterial system is well opacified. There is a small focal filling defect over the proximal right lower lobar pulmonary artery which may be an acute or chronic embolus. No other pulmonary emboli identified. Tortuosity of the proximal left common carotid artery and right brachiocephalic artery. Remaining vascular structures are unremarkable. Mediastinum/Nodes: No mediastinal or hilar adenopathy. Remaining mediastinal structures are normal. Lungs/Pleura: Lungs are well inflated without focal lobar consolidation. There is  dependent atelectasis over the posterior right base. Subtle reticulonodular density over the right upper lobe which may be acute or chronic and due to atypical infectious or inflammatory disease. Minimal scarring over the right midlung. Mucous plugging over several small airways in the left upper lobe. Couple peripheral subcentimeter dense nodules over the left upper lobe likely benign as the largest measures 2-3 mm. Upper Abdomen: Calcified plaque over the abdominal aorta. No acute findings. Musculoskeletal: Degenerative change of the spine. Mild to moderate compression fracture over the upper thoracic spine age indeterminate but new since April 2019. Review of the MIP images confirms the above findings. IMPRESSION: 1. Single small embolus over the proximal right lower lobar pulmonary artery which may be acute or chronic. 2. Subtle reticulonodular density over the right upper lobe which may be due to an acute or chronic process of of infectious, atypical infectious or inflammatory nature. Mild right basilar atelectasis. Patchy mucous plugging. Consider follow-up CT 4-6 weeks. 3. Mild ectasia of the ascending thoracic aorta measuring 3.6 cm. Recommend annual imaging followup by CTA or MRA. This recommendation follows 2010 ACCF/AHA/AATS/ACR/ASA/SCA/SCAI/SIR/STS/SVM Guidelines for the Diagnosis and Management of Patients with Thoracic Aortic Disease. Circulation.2010; 121: O962-X528. Aortic aneurysm NOS (ICD10-I71.9). 4.  Aortic Atherosclerosis (ICD10-I70.0). These results were called by telephone at the time of interpretation on 04/04/2019 at 10:18 am to provider Cathren Laine, MD , who verbally acknowledged these results. Electronically Signed   By: Elberta Fortis M.D.   On: 04/04/2019 10:20   Dg Chest Port 1 View  Result Date: 04/04/2019 CLINICAL DATA:  Hypoxia EXAM: PORTABLE CHEST 1 VIEW COMPARISON:  01/30/2018 FINDINGS: There is mild bilateral chronic interstitial thickening. There is no focal consolidation.  There is no pleural effusion or pneumothorax. There is stable cardiomegaly. There is a 2 cm nodular area in the right hilar region similar in appearance to the prior examination of 09/29/2017 which may reflect an enlarged lymph node. There is no acute osseous abnormality. IMPRESSION: No acute cardiopulmonary disease. 2 cm nodular area in the right hilar region similar in appearance to the prior examination of 09/29/2017 which may reflect an enlarged lymph node. If there is further clinical concern, recommend further characterization with a non emergent CT of the chest. Electronically  Signed   By: Kathreen Devoid   On: 04/04/2019 06:58    Assessment and Plan  Progressive dementia/status post COVID-19-patient progression dementia most likely due to COVID-19; now that patient has progressed enough to get up and out of bed in the wheelchair I hope that she will regain back some of her loss and cognition over the next 3 to 6 months; will monitor     Hennie Duos, MD

## 2019-04-29 DIAGNOSIS — M6281 Muscle weakness (generalized): Secondary | ICD-10-CM | POA: Diagnosis not present

## 2019-04-29 DIAGNOSIS — E559 Vitamin D deficiency, unspecified: Secondary | ICD-10-CM | POA: Diagnosis not present

## 2019-04-29 LAB — VITAMIN D 25 HYDROXY (VIT D DEFICIENCY, FRACTURES): Vit D, 25-Hydroxy: 29.96

## 2019-05-03 ENCOUNTER — Non-Acute Institutional Stay (SKILLED_NURSING_FACILITY): Payer: Medicare Other | Admitting: Internal Medicine

## 2019-05-03 ENCOUNTER — Encounter: Payer: Self-pay | Admitting: Internal Medicine

## 2019-05-03 DIAGNOSIS — I471 Supraventricular tachycardia: Secondary | ICD-10-CM | POA: Diagnosis not present

## 2019-05-03 DIAGNOSIS — I2699 Other pulmonary embolism without acute cor pulmonale: Secondary | ICD-10-CM | POA: Diagnosis not present

## 2019-05-03 DIAGNOSIS — F0280 Dementia in other diseases classified elsewhere without behavioral disturbance: Secondary | ICD-10-CM | POA: Diagnosis not present

## 2019-05-03 DIAGNOSIS — I5033 Acute on chronic diastolic (congestive) heart failure: Secondary | ICD-10-CM

## 2019-05-03 DIAGNOSIS — G301 Alzheimer's disease with late onset: Secondary | ICD-10-CM

## 2019-05-03 DIAGNOSIS — E559 Vitamin D deficiency, unspecified: Secondary | ICD-10-CM

## 2019-05-03 NOTE — Progress Notes (Signed)
Location:  [facility] Room Number: 205-D Place of Service:  SNF [phone]) Provider:  Granville Lewis, PA-C  Hennie Duos, MD  Patient Care Team: Hennie Duos, MD as PCP - General (Internal Medicine)  Extended Emergency Contact Information Primary Emergency Contact: McNeely,Richard Address: [address]          [place], [place] [address] [place] Phone: [phone] Relation: Brother  Code Status:  DNR Goals of care: Advanced Directive information Advanced Directives 05/03/2019  Does Patient Have a Medical Advance Directive? Yes  Type of Advance Directive Out of facility DNR (pink MOST or yellow form)  Does patient want to make changes to medical advance directive? No - Patient declined  Would patient like information on creating a medical advance directive? No - Patient declined  Pre-existing out of facility DNR order (yellow form or pink MOST form) Yellow form placed in chart (order not valid for inpatient use)     Chief Complaint  Patient presents with  . Medical Management of Chronic Issues    Routine [facility] visit  Medical management of chronic medical conditions including progressive dementia-chronic kidney disease-supraventricular tachycardia-history of pulmonary embolism-hypertension-vitamin D deficiency-constipation as well as CHF and history of hypernatremia  HPI:  Pt is a 85 y.o. female seen today for medical management of chronic diseases.  As noted above Most recent acute issue was a Covid 08+ test complicated with progressive dementia.  She appears to have stabilized somewhat in this regards although still has a spotty appetite she is on supplements and Remeron for appetite stimulation.  She also was hospitalized recently for hypoxia and was found to have a pulmonary embolism-she has been started on Eliquis 5 mg twice daily and this appears to be stable.  In regards to her other issues she does have  some history of hypernatremia with chronic kidney disease lab done on October 16 shows stability with a creatinine of 0.89 her sodium was 144 this will warrant updating especially with her spotty p.o. intake.  In regards to supraventricular tachycardia she is on Toprol-XL 25 mg a day in addition to the Eliquis for anticoagulation rate appears to be controlled.  She also has a history of CHF she is not on a diuretic she is on the beta-blocker Toprol as noted above this appears stable she does have some chronic right lower extremity edema this is most noticeable on her foot but appears to be fairly baseline today  Patient is a poor historian and does not really follow verbal commands but she appears to be bright alert today she is responsive makes eye contact and is smiling.        Past Medical History:  Diagnosis Date  . Acne rosacea   . Acute pulmonary embolism ([place]) 04/04/2019  . CKD (chronic kidney disease), stage III   . Depression   . Hypertension   . Osteopenia   . Pneumonia due to COVID-19 virus 01/13/2019  . PUD (peptic ulcer disease)   . Restrictive lung disease   . Shingles   . Tinnitus    and vertigo   Past Surgical History:  Procedure Laterality Date  . None      No Known Allergies  Outpatient Encounter Medications as of 05/03/2019  Medication Sig  . apixaban (ELIQUIS) 5 MG TABS tablet Take 1 tablet (5 mg total) by mouth 2 (two) times daily.  . cetaphil (CETAPHIL) lotion Apply 1 application topically at bedtime. Apply  to bilateral lower extremities  . Ensure (ENSURE) Take 237 mLs by mouth 3 (three) times daily.  . metoprolol succinate (TOPROL-XL) 25 MG 24 hr tablet Take 25 mg by mouth daily.   . mirtazapine (REMERON) 15 MG tablet Take 7.5 mg by mouth at bedtime. Take 0.5 tablet to = 7.5 mg  . polyethylene glycol (MIRALAX / GLYCOLAX) packet Take 17 g by mouth every evening.   . potassium chloride (K-DUR,KLOR-CON) 10 MEQ tablet Take 10 mEq by mouth every other day.    . [DISCONTINUED] Ascorbic Acid (VITAMIN C) 1000 MG tablet Take 1,000 mg by mouth every evening.   . [DISCONTINUED] Cholecalciferol (VITAMIN D3) 1.25 MG (50000 UT) CAPS Take 1 capsule by mouth once a week.   No facility-administered encounter medications on file as of 05/03/2019.     Review of Systems   Is unobtainable secondary to dementia please see HPI nursing staff does not report any reports of acute discomfort chest pain shortness of breath or discomfort-she has a spotty appetite per discussion with her nursing tech weight has been relatively stable over the past month  Immunization History  Administered Date(s) Administered  . Influenza, High Dose Seasonal PF 03/31/2017  . Influenza-Unspecified 03/17/2018, 04/01/2019   Pertinent  Health Maintenance Due  Topic Date Due  . PNA vac Low Risk Adult (1 of 2 - PCV13) 12/28/1988  . INFLUENZA VACCINE  Completed  . DEXA SCAN  Discontinued   No flowsheet data found. Functional Status Survey:    Vitals:   05/03/19 1213  BP: 108/60  Pulse: 76  Resp: 18  Temp: (!) 97.3 F (36.3 C)  TempSrc: Oral  Weight: 93 lb 12.8 oz (42.5 kg)  Height: 5\' 4"  (1.626 m)   Body mass index is 16.1 kg/m. Physical Exam   In general this is a somewhat frail elderly female in no distress lying comfortably in bed she is alert makes eye contact smiling.  Her skin is warm and dry she does have some chronic bruising.  Oropharynx was difficult to assess since she did not really follow verbal commands and open her mouth much.  Chest sounds clear to auscultation without labored breathing respiratory effort was poor since she does not really follow verbal commands.  Heart is regular irregular rate and rhythm without murmur gallop or rub she does not really have significant edema except some mild pedal edema which is cool to touch flesh colored on her right foot.  Abdomen is soft nontender with positive bowel sounds.  Musculoskeletal holds her legs in a  contracted position bilaterally-she does have some movement of her upper extremities although is stiff.  Neurologic as noted above her speech is clear but does not really speak much-she repeats "okay" whenever I ask her a question \ Psych as noted above-consistent with progressive dementia.   Labs reviewed: Recent Labs    04/04/19 0629 04/05/19 0433 04/07/19 04/13/19  NA 145 148* 148* 144  K 4.3 3.4* 3.6 4.2  CL 113* 114*  --   --   CO2 23 23  --   --   GLUCOSE 94 77  --   --   BUN 21 19 20 20   CREATININE 0.78 0.79 0.6 0.9  CALCIUM 8.4* 8.6*  --   --    Recent Labs    04/04/19 0629  AST 13*  ALT 7  ALKPHOS 69  BILITOT 1.2  PROT 6.1*  ALBUMIN 2.4*   Recent Labs    04/04/19 0629 04/05/19 0433 04/06/19  [id number] 04/07/19 04/13/19  WBC 8.4 8.1 7.6 6.8 6.1  NEUTROABS 6.0  --   --  5 4  HGB 13.7 12.9 11.5* 11.7* 11.7*  HCT 44.6 41.1 37.5 36 35*  MCV 100.7* 99.0 100.3*  --   --   PLT 407* 370 354 333 376   Lab Results  Component Value Date   TSH 4.553 (H) 01/30/2018   No results found for: HGBA1C Lab Results  Component Value Date   CHOL 167 08/15/2015   HDL 65 08/15/2015   LDLCALC 87 08/15/2015   TRIG 76 08/15/2015   CHOLHDL 2.6 08/15/2015    Significant Diagnostic Results in last 30 days:  Ct Angio Chest Pe W And/or Wo Contrast  Result Date: 04/04/2019 CLINICAL DATA:  Shortness of breath/hypoxia.  Dementia. EXAM: CT ANGIOGRAPHY CHEST WITH CONTRAST TECHNIQUE: Multidetector CT imaging of the chest was performed using the standard protocol during bolus administration of intravenous contrast. Multiplanar CT image reconstructions and MIPs were obtained to evaluate the vascular anatomy. Patient unable to raise arms for exam. CONTRAST:  80mL OMNIPAQUE IOHEXOL 350 MG/ML SOLN COMPARISON:  Chest x-ray 09/29/2017. FINDINGS: Cardiovascular: Mild cardiomegaly. Mild ectasia of the ascending thoracic aorta measuring 3.6 cm. Calcified plaque over the thoracic aorta. Pulmonary  arterial system is well opacified. There is a small focal filling defect over the proximal right lower lobar pulmonary artery which may be an acute or chronic embolus. No other pulmonary emboli identified. Tortuosity of the proximal left common carotid artery and right brachiocephalic artery. Remaining vascular structures are unremarkable. Mediastinum/Nodes: No mediastinal or hilar adenopathy. Remaining mediastinal structures are normal. Lungs/Pleura: Lungs are well inflated without focal lobar consolidation. There is dependent atelectasis over the posterior right base. Subtle reticulonodular density over the right upper lobe which may be acute or chronic and due to atypical infectious or inflammatory disease. Minimal scarring over the right midlung. Mucous plugging over several small airways in the left upper lobe. Couple peripheral subcentimeter dense nodules over the left upper lobe likely benign as the largest measures 2-3 mm. Upper Abdomen: Calcified plaque over the abdominal aorta. No acute findings. Musculoskeletal: Degenerative change of the spine. Mild to moderate compression fracture over the upper thoracic spine age indeterminate but new since April 2019. Review of the MIP images confirms the above findings. IMPRESSION: 1. Single small embolus over the proximal right lower lobar pulmonary artery which may be acute or chronic. 2. Subtle reticulonodular density over the right upper lobe which may be due to an acute or chronic process of of infectious, atypical infectious or inflammatory nature. Mild right basilar atelectasis. Patchy mucous plugging. Consider follow-up CT 4-6 weeks. 3. Mild ectasia of the ascending thoracic aorta measuring 3.6 cm. Recommend annual imaging followup by CTA or MRA. This recommendation follows 2010 ACCF/AHA/AATS/ACR/ASA/SCA/SCAI/SIR/STS/SVM Guidelines for the Diagnosis and Management of Patients with Thoracic Aortic Disease. Circulation.2010; 121: A213-Y865: E266-e369. Aortic aneurysm NOS  (ICD10-I71.9). 4.  Aortic Atherosclerosis (ICD10-I70.0). These results were called by telephone at the time of interpretation on 04/04/2019 at 10:18 am to provider Cathren LaineKevin Steinl, MD , who verbally acknowledged these results. Electronically Signed   By: Elberta Fortisaniel  Boyle M.D.   On: 04/04/2019 10:20   Dg Chest Port 1 View  Result Date: 04/04/2019 CLINICAL DATA:  Hypoxia EXAM: PORTABLE CHEST 1 VIEW COMPARISON:  01/30/2018 FINDINGS: There is mild bilateral chronic interstitial thickening. There is no focal consolidation. There is no pleural effusion or pneumothorax. There is stable cardiomegaly. There is a 2 cm nodular area in the right  hilar region similar in appearance to the prior examination of 09/29/2017 which may reflect an enlarged lymph node. There is no acute osseous abnormality. IMPRESSION: No acute cardiopulmonary disease. 2 cm nodular area in the right hilar region similar in appearance to the prior examination of 09/29/2017 which may reflect an enlarged lymph node. If there is further clinical concern, recommend further characterization with a non emergent CT of the chest. Electronically Signed   By: Elige Ko   On: 04/04/2019 06:58    Assessment/Plan  #1 progressive dementia-with recent diagnosis of COVID-19-she is now out of isolation she had some cognitive decline during Covid-some of this has returned she continues however to have significant dementia at this point continue supportive care-her weight appears to be stabilized over the last month or so this will have to be monitored-- she is on supplements and p.o. intake will have to be encouraged -She is on Remeron for appetite stimulation   #2 history of recent pulmonary embolism she is on Eliquis 5 mg twice daily-at this point appears to be at baseline and stable.  3.  History of supraventricular tachycardia this appears rate controlled on Toprol 25 mg a day she is on Eliquis 5 mg twice daily for anticoagulation.  4.-History of chronic  kidney disease this appears stabilized with a creatinine of 0.89 BUN of 19.6 on recent lab will update this since he does have some spotty p.o. intake.  5.  History of hypernatremia as noted above we will update a BMP-- at times  she has some elevated sodiums it was 144 on most recent lab--at times she has had fairly aggressive fluid supplementation  #6-history of hypertension-she continues on Toprol-XL 25 mg a day recent blood pressures-108/60 132/82-102/67-142/61-appears to be in this range.  7.  History of vitamin D deficiency she is on supplement last vitamin D level appeared to be slowly rising it was 29.96 will monitor periodically.  8.  History of CHF this appears to be compensated her edema is minimal and at baseline-she is on Toprol-XL 25 mg a day she is not on a diuretic.  I do note she is on low-dose potassium with apparently history of mild hypokalemia again will update a metabolic panel  [phone]

## 2019-05-04 ENCOUNTER — Encounter: Payer: Self-pay | Admitting: Internal Medicine

## 2019-05-04 DIAGNOSIS — Z79899 Other long term (current) drug therapy: Secondary | ICD-10-CM | POA: Diagnosis not present

## 2019-05-04 DIAGNOSIS — D649 Anemia, unspecified: Secondary | ICD-10-CM | POA: Diagnosis not present

## 2019-05-04 LAB — BASIC METABOLIC PANEL
BUN: 16 (ref 4–21)
CO2: 27 — AB (ref 13–22)
Chloride: 111 — AB (ref 99–108)
Creatinine: 0.7 (ref 0.5–1.1)
Glucose: 81
Potassium: 3.8 (ref 3.4–5.3)
Sodium: 143 (ref 137–147)

## 2019-05-04 LAB — COMPREHENSIVE METABOLIC PANEL
Calcium: 8.2 — AB (ref 8.7–10.7)
GFR calc Af Amer: 87.53
GFR calc non Af Amer: 75.52

## 2019-05-24 ENCOUNTER — Non-Acute Institutional Stay (SKILLED_NURSING_FACILITY): Payer: Medicare Other | Admitting: Internal Medicine

## 2019-05-24 DIAGNOSIS — J069 Acute upper respiratory infection, unspecified: Secondary | ICD-10-CM | POA: Diagnosis not present

## 2019-05-24 DIAGNOSIS — J984 Other disorders of lung: Secondary | ICD-10-CM

## 2019-05-24 DIAGNOSIS — N183 Chronic kidney disease, stage 3 unspecified: Secondary | ICD-10-CM | POA: Diagnosis not present

## 2019-05-24 DIAGNOSIS — Z298 Encounter for other specified prophylactic measures: Secondary | ICD-10-CM | POA: Diagnosis not present

## 2019-05-24 DIAGNOSIS — Z789 Other specified health status: Secondary | ICD-10-CM | POA: Diagnosis not present

## 2019-05-24 DIAGNOSIS — I129 Hypertensive chronic kidney disease with stage 1 through stage 4 chronic kidney disease, or unspecified chronic kidney disease: Secondary | ICD-10-CM | POA: Diagnosis not present

## 2019-05-25 DIAGNOSIS — E559 Vitamin D deficiency, unspecified: Secondary | ICD-10-CM | POA: Diagnosis not present

## 2019-05-25 DIAGNOSIS — I10 Essential (primary) hypertension: Secondary | ICD-10-CM | POA: Diagnosis not present

## 2019-05-25 LAB — VITAMIN D 25 HYDROXY (VIT D DEFICIENCY, FRACTURES): Vit D, 25-Hydroxy: 34.7

## 2019-05-25 NOTE — Progress Notes (Signed)
Location:  [facility] Room Number: 205-D Place of Service:  SNF (31)  Hennie Duos, MD  Patient Care Team: Hennie Duos, MD as PCP - General (Internal Medicine)  Extended Emergency Contact Information Primary Emergency Contact: McNeely,Richard Address: [address]          [place], [place] [address] [place] Phone: [phone] Relation: Brother    Allergies: Patient has no known allergies.  Chief Complaint  Patient presents with  . Acute Visit    Patient is seen for COVID prophylaxis    HPI: Patient is a 85 y.o. female who who is being seen for viral prophylaxis.  Everyone in the facility has been put on vitamin C, zinc, and vitamin D.  Past Medical History:  Diagnosis Date  . Acne rosacea   . Acute pulmonary embolism ([place]) 04/04/2019  . CKD (chronic kidney disease), stage III   . Depression   . Hypertension   . Osteopenia   . Pneumonia due to COVID-19 virus 01/13/2019  . PUD (peptic ulcer disease)   . Restrictive lung disease   . Shingles   . Tinnitus    and vertigo    Past Surgical History:  Procedure Laterality Date  . None      Allergies as of 05/24/2019   No Known Allergies     Medication List       Accurate as of May 24, 2019 11:59 PM. If you have any questions, ask your nurse or doctor.        apixaban 5 MG Tabs tablet Commonly known as: ELIQUIS Take 1 tablet (5 mg total) by mouth 2 (two) times daily.   cetaphil lotion Apply 1 application topically at bedtime. Apply to bilateral lower extremities   Ensure Take 237 mLs by mouth 3 (three) times daily.   metoprolol succinate 25 MG 24 hr tablet Commonly known as: TOPROL-XL Take 25 mg by mouth daily.   mirtazapine 15 MG tablet Commonly known as: REMERON Take 7.5 mg by mouth at bedtime. Take 0.5 tablet to = 7.5 mg   polyethylene glycol 17 g packet Commonly known as: MIRALAX / GLYCOLAX Take 17 g by mouth every evening.    potassium chloride 10 MEQ tablet Commonly known as: KLOR-CON Take 10 mEq by mouth every other day.       No orders of the defined types were placed in this encounter.   Immunization History  Administered Date(s) Administered  . Influenza, High Dose Seasonal PF 03/31/2017  . Influenza-Unspecified 03/17/2018, 04/01/2019    Social History   Tobacco Use  . Smoking status: Never Smoker  . Smokeless tobacco: Never Used  Substance Use Topics  . Alcohol use: No    Review of Systems  GENERAL:  no fevers, fatigue, appetite changes SKIN: No itching, rash HEENT: No complaint RESPIRATORY: No cough, wheezing, SOB CARDIAC: No chest pain, palpitations, lower extremity edema  GI: No abdominal pain, No N/V/D or constipation, No heartburn or reflux  GU: No dysuria, frequency or urgency, or incontinence  MUSCULOSKELETAL: No unrelieved bone/joint pain NEUROLOGIC: No headache, dizziness  PSYCHIATRIC: No overt anxiety or sadness  Vitals:   05/24/19 1558  BP: 108/60  Pulse: 76  Resp: 18  Temp: (!) 97.3 F (36.3 C)   Body mass index is 15.84 kg/m. Physical Exam  GENERAL APPEARANCE: Alert, conversant, No acute distress  SKIN: No diaphoresis rash HEENT: Unremarkable RESPIRATORY: Breathing is even, unlabored. Lung sounds are clear  CARDIOVASCULAR: Heart RRR no murmurs, rubs or gallops. No peripheral edema  GASTROINTESTINAL: Abdomen is soft, non-tender, not distended w/ normal bowel sounds.  GENITOURINARY: Bladder non tender, not distended  MUSCULOSKELETAL: No abnormal joints or musculature NEUROLOGIC: Cranial nerves 2-12 grossly intact. Moves all extremities PSYCHIATRIC: Dementia, no behavioral issues  Patient Active Problem List   Diagnosis Date Noted  . Pressure injury of skin 04/06/2019  . Acute pulmonary embolism (HCC) 04/04/2019  . Acute hypoxemic respiratory failure (HCC) 04/04/2019  . Idiopathic pulmonary fibrosis (HCC) 04/04/2019  . Vitamin D deficiency 03/19/2019   . Real time reverse transcriptase PCR positive for COVID-19 virus 02/13/2019  . Poor fluid intake 02/13/2019  . Alteration in cognition 02/13/2019  . Urinary tract infection due to Proteus 01/17/2019  . Pneumonia due to COVID-19 virus 01/13/2019  . FTT (failure to thrive) in adult 10/05/2018  . Chronic constipation 10/05/2018  . Cellulitis of left lower extremity 07/07/2018  . Pneumonia of lower lobe of lung 02/09/2018  . Delirium 02/09/2018  . Chronic diastolic (congestive) heart failure (HCC) 02/09/2018  . Hematoma of left lower extremity 02/09/2018  . Acute on chronic diastolic CHF (congestive heart failure) (HCC) 01/30/2018  . Sepsis due to pneumonia (HCC) 01/30/2018  . Agitation 01/30/2018  . Right leg injury, subsequent encounter 01/30/2018  . Benign hypertension with chronic kidney disease, stage III 10/06/2017  . CKD (chronic kidney disease), stage III 10/06/2017  . Restrictive airway disease 10/06/2017  . Fall 09/29/2017  . Acute renal failure superimposed on stage 3 chronic kidney disease (HCC) 09/29/2017  . Bilateral lower extremity edema 08/15/2015  . Paroxysmal supraventricular tachycardia (HCC) 12/06/2013  . Essential hypertension, benign 12/06/2013  . Syncope and collapse 12/06/2013    CMP     Component Value Date/Time   NA 144 04/13/2019   K 4.2 04/13/2019   CL 114 (H) 04/05/2019 0433   CO2 23 04/05/2019 0433   GLUCOSE 77 04/05/2019 0433   BUN 20 04/13/2019   CREATININE 0.9 04/13/2019   CREATININE 0.79 04/05/2019 0433   CREATININE 1.07 (H) 08/15/2015 1422   CALCIUM 8.6 (L) 04/05/2019 0433   PROT 6.1 (L) 04/04/2019 0629   ALBUMIN 2.4 (L) 04/04/2019 0629   AST 13 (L) 04/04/2019 0629   ALT 7 04/04/2019 0629   ALKPHOS 69 04/04/2019 0629   BILITOT 1.2 04/04/2019 0629   GFRNONAA >60 04/05/2019 0433   GFRAA >60 04/05/2019 0433   Recent Labs    04/04/19 0629 04/05/19 0433 04/07/19 04/13/19  NA 145 148* 148* 144  K 4.3 3.4* 3.6 4.2  CL 113* 114*  --   --    CO2 23 23  --   --   GLUCOSE 94 77  --   --   BUN 21 19 20 20   CREATININE 0.78 0.79 0.6 0.9  CALCIUM 8.4* 8.6*  --   --    Recent Labs    04/04/19 0629  AST 13*  ALT 7  ALKPHOS 69  BILITOT 1.2  PROT 6.1*  ALBUMIN 2.4*   Recent Labs    04/04/19 0629 04/05/19 0433 04/06/19 0449 04/07/19 04/13/19  WBC 8.4 8.1 7.6 6.8 6.1  NEUTROABS 6.0  --   --  5 4  HGB 13.7 12.9 11.5* 11.7* 11.7*  HCT 44.6 41.1 37.5 36 35*  MCV 100.7* 99.0 100.3*  --   --   PLT 407* 370 354 333 376   No results for input(s): CHOL, LDLCALC, TRIG in the last 8760 hours.  Invalid input(s):  HCL No results found for: [facility] Lab Results  Component Value Date   TSH 4.553 (H) 01/30/2018   No results found for: HGBA1C Lab Results  Component Value Date   CHOL 167 08/15/2015   HDL 65 08/15/2015   LDLCALC 87 08/15/2015   TRIG 76 08/15/2015   CHOLHDL 2.6 08/15/2015    Significant Diagnostic Results in last 30 days:  No results found.  Assessment and Plan  Hypertension with chronic kidney disease/restrictive airways disease/need for immunologic prophylaxis/does not have immunity to COVID-19-pharmacy was consulted.  Patient has been written for zinc sulfate 220 mg 1 p.o. every other day, vitamin C 500 mg daily and vitamin D 50,000 units monthly patient's vitamin D level on 10/29 was 29.96.  Patient will have vitamin D level drawn today and again in 90 days.  Patient is on Eliquis 5 mg twice daily     Margit HanksAnne D Lorimer Tiberio, MD

## 2019-05-29 ENCOUNTER — Encounter: Payer: Self-pay | Admitting: Internal Medicine

## 2019-06-03 ENCOUNTER — Non-Acute Institutional Stay (SKILLED_NURSING_FACILITY): Payer: Medicare Other | Admitting: Internal Medicine

## 2019-06-03 ENCOUNTER — Encounter: Payer: Self-pay | Admitting: Internal Medicine

## 2019-06-03 DIAGNOSIS — N183 Chronic kidney disease, stage 3 unspecified: Secondary | ICD-10-CM | POA: Diagnosis not present

## 2019-06-03 DIAGNOSIS — Z8659 Personal history of other mental and behavioral disorders: Secondary | ICD-10-CM

## 2019-06-03 DIAGNOSIS — I5032 Chronic diastolic (congestive) heart failure: Secondary | ICD-10-CM | POA: Diagnosis not present

## 2019-06-03 DIAGNOSIS — I2699 Other pulmonary embolism without acute cor pulmonale: Secondary | ICD-10-CM | POA: Diagnosis not present

## 2019-06-03 DIAGNOSIS — I10 Essential (primary) hypertension: Secondary | ICD-10-CM | POA: Diagnosis not present

## 2019-06-03 DIAGNOSIS — I471 Supraventricular tachycardia: Secondary | ICD-10-CM | POA: Diagnosis not present

## 2019-06-03 NOTE — Progress Notes (Signed)
Location:  [facility] Room Number: 205-D Place of Service:  SNF (31) Provider:  Granville Lewis, PA-C  Patient Care Team: Hennie Duos, MD as PCP - General (Internal Medicine)  Extended Emergency Contact Information Primary Emergency Contact: McNeely,Richard Address: [address]          [place], [place] [address] [place] Phone: [phone] Relation: Brother  Code Status:  DNR Goals of care: Advanced Directive information Advanced Directives 05/25/2019  Does Patient Have a Medical Advance Directive? Yes  Type of Advance Directive Out of facility DNR (pink MOST or yellow form)  Does patient want to make changes to medical advance directive? No - Patient declined  Would patient like information on creating a medical advance directive? -  Pre-existing out of facility DNR order (yellow form or pink MOST form) -     Chief Complaint  Patient presents with   Medical Management of Chronic Issues    Routine [facility] visit   Medical management of chronic medical conditions including dementia-chronic kidney disease-restrictive lung disease-supraventricular tachycardia-and recent history of pulmonary embolism.   HPI:  Pt is a 85 y.o. female seen today for medical management of chronic diseases.  As noted above.  She was diagnosed at 1 point with Covid 19-but this appears to have stabilized and she is back out of isolation and has been at baseline.  Previous to that she was in the hospital for hypoxia and was found to have a pulmonary embolism-she continues on Eliquis 5 mg twice daily and appears to be stable on this.  She continues to have significant dementia with poor appetite but weight appears relatively stable at around 92 pounds she is on Remeron for appetite stimulation as well as supplements.  She does have some history of hypernatremia with her chronic kidney disease but this appears relatively stable last lab  done on November 3 shows a creatinine of 0.65 BUN of 16 and sodium of 143.  In regards to her history of supraventricular tachycardia she is on Lopressor 25 mg a day she is also on Eliquis for anticoagulation rate appears to be controlled.  She also has a listed history of CHF not on a diuretic she is on a beta-blocker this appears stable she does not really have significant lower extremity edema except some chronic right lower extremity edema which appears baseline this evening.  Currently she is resting in bed comfortably she does not really follow verbal commands but she is not combative is smiling does make eye contact but has significant dementia.     Past Medical History:  Diagnosis Date   Acne rosacea    Acute pulmonary embolism ([facility]) 04/04/2019   CKD (chronic kidney disease), stage III    Depression    Hypertension    Osteopenia    Pneumonia due to COVID-19 virus 01/13/2019   PUD (peptic ulcer disease)    Restrictive lung disease    Shingles    Tinnitus    and vertigo   Past Surgical History:  Procedure Laterality Date   None      No Known Allergies  Outpatient Encounter Medications as of 06/03/2019  Medication Sig   Amino Acids-Protein Hydrolys (FEEDING SUPPLEMENT, PRO-STAT SUGAR FREE 64,) LIQD Take 30 mLs by mouth 2 (two) times daily.   apixaban (ELIQUIS) 5 MG TABS tablet Take 1 tablet (5 mg total) by mouth 2 (two) times daily.   cetaphil (CETAPHIL) lotion Apply 1 application  topically at bedtime. Apply to bilateral lower extremities   Cholecalciferol (VITAMIN D3) 1.25 MG (50000 UT) CAPS Take 1 capsule by mouth.   Ensure (ENSURE) Take 237 mLs by mouth 3 (three) times daily.   metoprolol succinate (TOPROL-XL) 25 MG 24 hr tablet Take 25 mg by mouth daily.    mirtazapine (REMERON) 15 MG tablet Take 7.5 mg by mouth at bedtime. Take 0.5 tablet to = 7.5 mg   polyethylene glycol (MIRALAX / GLYCOLAX) packet Take 17 g by mouth every evening.    potassium  chloride (K-DUR,KLOR-CON) 10 MEQ tablet Take 10 mEq by mouth every other day.    vitamin C (ASCORBIC ACID) 500 MG tablet Take 500 mg by mouth daily.   zinc sulfate 220 (50 Zn) MG capsule Take 220 mg by mouth daily.   No facility-administered encounter medications on file as of 06/03/2019.     Review of Systems  Is unobtainable secondary to dementia please see HPI nursing does not report any recent acute issues  Immunization History  Administered Date(s) Administered   Influenza, High Dose Seasonal PF 03/31/2017   Influenza-Unspecified 03/17/2018, 04/01/2019   Pertinent  Health Maintenance Due  Topic Date Due   PNA vac Low Risk Adult (1 of 2 - PCV13) 12/28/1988   INFLUENZA VACCINE  Completed   DEXA SCAN  Discontinued   Fall Risk  02/01/2019  Falls in the past year? (No Data)  Comment Emmi Telephone Survey: data to providers prior to load  Number falls in past yr: (No Data)  Comment Emmi Telephone Survey Actual Response =    Functional Status Survey:    Vitals:   06/03/19 1624  BP: 108/60  Pulse: 76  Resp: 18  Temp: (!) 97.3 F (36.3 C)  TempSrc: Oral  SpO2: 98%  Weight: 92 lb 4.8 oz (41.9 kg)  Height: 5\' 4"  (1.626 m)   Body mass index is 15.84 kg/m. Physical Exam   In general this is a frail elderly female in no distress lying comfortably in bed she does make eye contact does not really speak much. She is alert.  Her skin is warm and dry with some chronic bruising which is unchanged it appears from baseline.  She does have covering over her left great toe and dorsal aspect of her right foot with a history of wounds which are followed by wound care --she also has a sacral wound which was not assessed this evening secondary to patient positioning-is followed by wound care  Oropharynx continues to be difficult to assess since patient does not really open her mouth on command.  Chest is clear to auscultation with poor respiratory effort cannot really appreciate any  congestion or any sign of labored breathing.  Heart is regular rate and rhythm with an occasional irregular skipped beats-she does not have significant lower extremity edema except for some baseline mild right foot edema.  Abdomen is soft does not appear to be tender there are positive bowel sounds.  Musculoskeletal does hold her lower extremities in a continued somewhat contracted position at baseline she does move her upper extremities it appears at relative baseline-difficult exam because she does not really follow verbal commands.  Neurologic she is alert she does make eye contact appears to be at baseline cannot really appreciate any true lateralizing findings--she does speak some but relatively unintelligible.  Psych appears to be at baseline with history of significant dementia.  Labs.  May 04, 2019.  Sodium 143 potassium 3.8 BUN 16.2 creatinine 0.65.  Labs reviewed: Recent Labs    04/04/19 0629 04/05/19 0433 04/07/19 04/13/19  NA 145 148* 148* 144  K 4.3 3.4* 3.6 4.2  CL 113* 114*  --   --   CO2 23 23  --   --   GLUCOSE 94 77  --   --   BUN 21 19 20 20   CREATININE 0.78 0.79 0.6 0.9  CALCIUM 8.4* 8.6*  --   --    Recent Labs    04/04/19 0629  AST 13*  ALT 7  ALKPHOS 69  BILITOT 1.2  PROT 6.1*  ALBUMIN 2.4*   Recent Labs    04/04/19 0629 04/05/19 0433 04/06/19 0449 04/07/19 04/13/19  WBC 8.4 8.1 7.6 6.8 6.1  NEUTROABS 6.0  --   --  5 4  HGB 13.7 12.9 11.5* 11.7* 11.7*  HCT 44.6 41.1 37.5 36 35*  MCV 100.7* 99.0 100.3*  --   --   PLT 407* 370 354 333 376   Lab Results  Component Value Date   TSH 4.553 (H) 01/30/2018   No results found for: HGBA1C Lab Results  Component Value Date   CHOL 167 08/15/2015   HDL 65 08/15/2015   LDLCALC 87 08/15/2015   TRIG 76 08/15/2015   CHOLHDL 2.6 08/15/2015    Significant Diagnostic Results in last 30 days:  No results found.  Assessment/Plan #1 history of significant dementia-at this point  emphasis is on supportive care her weight appears relatively stabilized in the low 90s she is on supplements as well as Remeron for appetite stimulation-she appears to be comfortable at this point continue supportive care.  2.  History of pulmonary embolism she continues on Eliquis 5 mg twice daily at this point appears to be stable.  3.  History of supraventricular tachycardia appears controlled on Toprol 25 mg a day as noted above she is on Eliquis for anticoagulation.  4.  History of hypernatremia-chronic kidney disease-this appears relatively stable at times she will have a mildly elevated sodium will have this rechecked last sodium a month ago was 143 creatinine was 0.65  #5 history of hypertension-at this point appears to be stable blood pressure systolically lately appears to be more in the lower 100s-she is on Toprol 25 mg a day.  6.  History of CHF appears to be compensated with minimal edema more so of her right foot which is baseline-she is on Toprol-XL not on a diuretic I suspect with her challenges with dementia there would be concerns for acute kidney injury with aggressive Lasix supplementation-at this point will monitor I do note she is on low dose potassium apparently with some history of hypokalemia will have a lab updated.  7.  History of vitamin D deficiency she is on supplementation vitamin D level appears rising at 34.7.   [id number]

## 2019-06-04 DIAGNOSIS — I10 Essential (primary) hypertension: Secondary | ICD-10-CM | POA: Diagnosis not present

## 2019-06-04 DIAGNOSIS — D649 Anemia, unspecified: Secondary | ICD-10-CM | POA: Diagnosis not present

## 2019-06-04 LAB — BASIC METABOLIC PANEL
BUN: 14 (ref 4–21)
CO2: 23 — AB (ref 13–22)
Chloride: 107 (ref 99–108)
Creatinine: 0.7 (ref 0.5–1.1)
Glucose: 79
Potassium: 4 (ref 3.4–5.3)
Sodium: 143 (ref 137–147)

## 2019-06-04 LAB — COMPREHENSIVE METABOLIC PANEL
Calcium: 8.8 (ref 8.7–10.7)
GFR calc Af Amer: 83.92
GFR calc non Af Amer: 72.41

## 2019-06-07 ENCOUNTER — Non-Acute Institutional Stay (SKILLED_NURSING_FACILITY): Payer: Medicare Other | Admitting: Internal Medicine

## 2019-06-07 DIAGNOSIS — F0281 Dementia in other diseases classified elsewhere with behavioral disturbance: Secondary | ICD-10-CM | POA: Diagnosis not present

## 2019-06-07 DIAGNOSIS — F02818 Dementia in other diseases classified elsewhere, unspecified severity, with other behavioral disturbance: Secondary | ICD-10-CM

## 2019-06-07 DIAGNOSIS — G301 Alzheimer's disease with late onset: Secondary | ICD-10-CM | POA: Diagnosis not present

## 2019-06-07 NOTE — Progress Notes (Signed)
This encounter was created in error - please disregard.

## 2019-06-07 NOTE — Progress Notes (Signed)
Location:  [facility] Room Number: 205-D Place of Service:  SNF (31)  Hennie Duos, MD  Patient Care Team: Hennie Duos, MD as PCP - General (Internal Medicine)  Extended Emergency Contact Information Primary Emergency Contact: McNeely,Richard Address: [address]          [place], [place] [address] [place] Phone: [phone] Relation: Brother    Allergies: Patient has no known allergies.  Chief Complaint  Patient presents with  . Acute Visit    HPI: Patient is a 85 y.o. female who is being seen for agitation.  This morning she could not be calmed and staff is asked me for something that I can use that will help her calm down when she gets so upset.  Patients with dementia and took a huge hit when she had COVID-19 earlier this year and now does get agitated.  Past Medical History:  Diagnosis Date  . Acne rosacea   . Acute pulmonary embolism ([facility]) 04/04/2019  . CKD (chronic kidney disease), stage III   . Depression   . Hypertension   . Osteopenia   . Pneumonia due to COVID-19 virus 01/13/2019  . PUD (peptic ulcer disease)   . Restrictive lung disease   . Shingles   . Tinnitus    and vertigo    Past Surgical History:  Procedure Laterality Date  . None      Allergies as of 06/07/2019   No Known Allergies     Medication List       Accurate as of June 07, 2019 11:59 PM. If you have any questions, ask your nurse or doctor.        apixaban 5 MG Tabs tablet Commonly known as: ELIQUIS Take 1 tablet (5 mg total) by mouth 2 (two) times daily.   cetaphil lotion Apply 1 application topically at bedtime. Apply to bilateral lower extremities   Ensure Take 237 mLs by mouth 3 (three) times daily.   feeding supplement (PRO-STAT SUGAR FREE 64) Liqd Take 30 mLs by mouth 2 (two) times daily.   metoprolol succinate 25 MG 24 hr tablet Commonly known as: TOPROL-XL Take 25 mg by mouth daily.    mirtazapine 15 MG tablet Commonly known as: REMERON Take 7.5 mg by mouth at bedtime. Take 0.5 tablet to = 7.5 mg   polyethylene glycol 17 g packet Commonly known as: MIRALAX / GLYCOLAX Take 17 g by mouth every evening.   potassium chloride 10 MEQ tablet Commonly known as: KLOR-CON Take 10 mEq by mouth every other day.   vitamin C 500 MG tablet Commonly known as: ASCORBIC ACID Take 500 mg by mouth daily.   Vitamin D3 1.25 MG (50000 UT) Caps Take 1 capsule by mouth.   zinc sulfate 220 (50 Zn) MG capsule Take 220 mg by mouth daily.       No orders of the defined types were placed in this encounter.   Immunization History  Administered Date(s) Administered  . Influenza, High Dose Seasonal PF 03/31/2017  . Influenza-Unspecified 03/17/2018, 04/01/2019    Social History   Tobacco Use  . Smoking status: Never Smoker  . Smokeless tobacco: Never Used  Substance Use Topics  . Alcohol use: No    Review of Systems  GENERAL:  no fevers, fatigue, appetite changes SKIN: No itching, rash HEENT: No complaint RESPIRATORY: No cough, wheezing, SOB CARDIAC: No chest pain, palpitations, lower extremity edema  GI: No abdominal pain, No  N/V/D or constipation, No heartburn or reflux  GU: No dysuria, frequency or urgency, or incontinence  MUSCULOSKELETAL: No unrelieved bone/joint pain NEUROLOGIC: No headache, dizziness  PSYCHIATRIC: No overt anxiety or sadness  Vitals:   06/07/19 1634  BP: 108/60  Pulse: 76  Resp: 18  Temp: (!) 97.3 F (36.3 C)   Body mass index is 15.84 kg/m. Physical Exam  GENERAL APPEARANCE: Alert, conversant, No acute distress  SKIN: No diaphoresis rash HEENT: Unremarkable RESPIRATORY: Breathing is even, unlabored. Lung sounds are clear   CARDIOVASCULAR: Heart RRR no murmurs, rubs or gallops. No peripheral edema  GASTROINTESTINAL: Abdomen is soft, non-tender, not distended w/ normal bowel sounds.  GENITOURINARY: Bladder non tender, not distended   MUSCULOSKELETAL: No abnormal joints or musculature NEUROLOGIC: Cranial nerves 2-12 grossly intact. Moves all extremities PSYCHIATRIC: Mood and affect-since Covid patient is often seen fearful but today is easily calmed, no behavioral issues  Patient Active Problem List   Diagnosis Date Noted  . Pressure injury of skin 04/06/2019  . Acute pulmonary embolism (HCC) 04/04/2019  . Acute hypoxemic respiratory failure (HCC) 04/04/2019  . Idiopathic pulmonary fibrosis (HCC) 04/04/2019  . Vitamin D deficiency 03/19/2019  . Real time reverse transcriptase PCR positive for COVID-19 virus 02/13/2019  . Poor fluid intake 02/13/2019  . Alteration in cognition 02/13/2019  . Urinary tract infection due to Proteus 01/17/2019  . Pneumonia due to COVID-19 virus 01/13/2019  . FTT (failure to thrive) in adult 10/05/2018  . Chronic constipation 10/05/2018  . Cellulitis of left lower extremity 07/07/2018  . Pneumonia of lower lobe of lung 02/09/2018  . Delirium 02/09/2018  . Chronic diastolic (congestive) heart failure (HCC) 02/09/2018  . Hematoma of left lower extremity 02/09/2018  . Acute on chronic diastolic CHF (congestive heart failure) (HCC) 01/30/2018  . Sepsis due to pneumonia (HCC) 01/30/2018  . Agitation 01/30/2018  . Right leg injury, subsequent encounter 01/30/2018  . Benign hypertension with chronic kidney disease, stage III 10/06/2017  . CKD (chronic kidney disease), stage III 10/06/2017  . Restrictive airway disease 10/06/2017  . Fall 09/29/2017  . Acute renal failure superimposed on stage 3 chronic kidney disease (HCC) 09/29/2017  . Bilateral lower extremity edema 08/15/2015  . Paroxysmal supraventricular tachycardia (HCC) 12/06/2013  . Essential hypertension, benign 12/06/2013  . Syncope and collapse 12/06/2013    CMP     Component Value Date/Time   NA 144 04/13/2019 0000   K 4.2 04/13/2019 0000   CL 114 (H) 04/05/2019 0433   CO2 23 04/05/2019 0433   GLUCOSE 77 04/05/2019 0433    BUN 20 04/13/2019 0000   CREATININE 0.9 04/13/2019 0000   CREATININE 0.79 04/05/2019 0433   CREATININE 1.07 (H) 08/15/2015 1422   CALCIUM 8.6 (L) 04/05/2019 0433   PROT 6.1 (L) 04/04/2019 0629   ALBUMIN 2.4 (L) 04/04/2019 0629   AST 13 (L) 04/04/2019 0629   ALT 7 04/04/2019 0629   ALKPHOS 69 04/04/2019 0629   BILITOT 1.2 04/04/2019 0629   GFRNONAA >60 04/05/2019 0433   GFRAA >60 04/05/2019 0433   Recent Labs    04/04/19 0629 04/05/19 0433 04/07/19 0000 04/13/19 0000  NA 145 148* 148* 144  K 4.3 3.4* 3.6 4.2  CL 113* 114*  --   --   CO2 23 23  --   --   GLUCOSE 94 77  --   --   BUN 21 19 20 20   CREATININE 0.78 0.79 0.6 0.9  CALCIUM 8.4* 8.6*  --   --  Recent Labs    04/04/19 0629  AST 13*  ALT 7  ALKPHOS 69  BILITOT 1.2  PROT 6.1*  ALBUMIN 2.4*   Recent Labs    04/04/19 0629 04/05/19 0433 04/06/19 0449 04/07/19 0000 04/13/19 0000  WBC 8.4 8.1 7.6 6.8 6.1  NEUTROABS 6.0  --   --  5 4  HGB 13.7 12.9 11.5* 11.7* 11.7*  HCT 44.6 41.1 37.5 36 35*  MCV 100.7* 99.0 100.3*  --   --   PLT 407* 370 354 333 376   No results for input(s): CHOL, LDLCALC, TRIG in the last 8760 hours.  Invalid input(s): HCL No results found for: [facility] Lab Results  Component Value Date   TSH 4.553 (H) 01/30/2018   No results found for: HGBA1C Lab Results  Component Value Date   CHOL 167 08/15/2015   HDL 65 08/15/2015   LDLCALC 87 08/15/2015   TRIG 76 08/15/2015   CHOLHDL 2.6 08/15/2015    Significant Diagnostic Results in last 30 days:  No results found.  Assessment and Plan  Dementia with agitation-I written for Ativan 0.5 mg/mL of gel, 0.5 mg daily as needed.    Margit HanksAnne D Daniell Mancinas, MD

## 2019-06-12 ENCOUNTER — Encounter: Payer: Self-pay | Admitting: Internal Medicine

## 2019-06-14 ENCOUNTER — Non-Acute Institutional Stay (SKILLED_NURSING_FACILITY): Payer: Medicare Other | Admitting: Internal Medicine

## 2019-06-14 ENCOUNTER — Encounter: Payer: Self-pay | Admitting: Internal Medicine

## 2019-06-14 DIAGNOSIS — Z79899 Other long term (current) drug therapy: Secondary | ICD-10-CM | POA: Diagnosis not present

## 2019-06-14 DIAGNOSIS — L89159 Pressure ulcer of sacral region, unspecified stage: Secondary | ICD-10-CM

## 2019-06-14 MED ORDER — HYDROCODONE-ACETAMINOPHEN 5-325 MG PO TABS: 1.0000 | ORAL_TABLET | ORAL | 0 refills | Status: DC

## 2019-06-14 NOTE — Progress Notes (Signed)
Location:  [facility] Room Number: 205-D Place of Service:  SNF ([id number]  Hennie Duos, MD  Patient Care Team: Hennie Duos, MD as PCP - General (Internal Medicine)  Extended Emergency Contact Information Primary Emergency Contact: McNeely,Richard Address: [address]          [place], [place] [address] [place] Phone: [phone] Relation: Brother    Allergies: Patient has no known allergies.  Chief Complaint  Patient presents with  . Acute Visit    HPI: Patient is 85 y.o. female sacral decubitus to the wound care nurse has asked me she can have some sort of pain medication before her dressing changes.  She appears just so very uncomfortable and normally she is very brave.  Past Medical History:  Diagnosis Date  . Acne rosacea   . Acute pulmonary embolism ([facility]) 04/04/2019  . CKD (chronic kidney disease), stage III   . Depression   . Hypertension   . Osteopenia   . Pneumonia due to COVID-19 virus 01/13/2019  . PUD (peptic ulcer disease)   . Restrictive lung disease   . Shingles   . Tinnitus    and vertigo    Past Surgical History:  Procedure Laterality Date  . None      Allergies as of 06/14/2019   No Known Allergies     Medication List       Accurate as of June 14, 2019 11:59 PM. If you have any questions, ask your nurse or doctor.        apixaban 5 MG Tabs tablet Commonly known as: ELIQUIS Take 1 tablet (5 mg total) by mouth 2 (two) times daily.   cetaphil lotion Apply 1 application topically at bedtime. Apply to bilateral lower extremities   Ensure Take 237 mLs by mouth 3 (three) times daily.   feeding supplement (PRO-STAT SUGAR FREE 64) Liqd Take 30 mLs by mouth 2 (two) times daily.   HYDROcodone-acetaminophen 5-325 MG tablet Commonly known as: Norco Take 1 tablet by mouth every morning. Started by: Inocencio Homes, MD   LORazepam 2 MG/ML concentrated  solution Commonly known as: ATIVAN 0.5 mg 2 (two) times daily as needed for anxiety. Apply topically   metoprolol succinate 25 MG 24 hr tablet Commonly known as: TOPROL-XL Take 25 mg by mouth daily.   mirtazapine 15 MG tablet Commonly known as: REMERON Take 7.5 mg by mouth at bedtime. Take 0.5 tablet to = 7.5 mg   polyethylene glycol 17 g packet Commonly known as: MIRALAX / GLYCOLAX Take 17 g by mouth every evening.   potassium chloride 10 MEQ tablet Commonly known as: KLOR-CON Take 10 mEq by mouth every other day.   vitamin C 500 MG tablet Commonly known as: ASCORBIC ACID Take 500 mg by mouth daily.   Vitamin D3 1.25 MG (50000 UT) Caps Take 1 capsule by mouth once a week.   zinc sulfate 220 (50 Zn) MG capsule Take 220 mg by mouth every other day.       Meds ordered this encounter  Medications  . HYDROcodone-acetaminophen (NORCO) 5-325 MG tablet    Sig: Take 1 tablet by mouth every morning.    Dispense:  60 tablet    Refill:  0    Immunization History  Administered Date(s) Administered  . Influenza, High Dose Seasonal PF 03/31/2017  . Influenza-Unspecified 03/17/2018, 04/01/2019    Social History   Tobacco Use  . Smoking status: Never Smoker  .  Smokeless tobacco: Never Used  Substance Use Topics  . Alcohol use: No    Review of Syste unable      Vitals:   06/14/19 1629  BP: 108/60  Pulse: 76  Resp: 18  Temp: (!) 97.3 F (36.3 C)  SpO2: 98%   Body mass index is 15.84 kg/m. Physical Exam  GENERAL APPEARANCE: Alert, conversant, No acute distress  SKIN: Wounds dressed during visit HEENT: Unremarkable RESPIRATORY: Breathing is even, unlabored. Lung sounds are clear   CARDIOVASCULAR: Heart RRR no murmurs, rubs or gallops. No peripheral edema  GASTROINTESTINAL: Abdomen is soft, non-tender, not distended w/ normal bowel sounds.  GENITOURINARY: Bladder non tender, not distended  MUSCULOSKELETAL: No abnormal joints or musculature NEUROLOGIC:  Cranial nerves 2-12 grossly intact. Moves all extremities PSYCHIATRIC: Dementia and mild anxiety, no behavioral issues  Patient Active Problem List   Diagnosis Date Noted  . Pressure injury of skin 04/06/2019  . Acute pulmonary embolism (HCC) 04/04/2019  . Acute hypoxemic respiratory failure (HCC) 04/04/2019  . Idiopathic pulmonary fibrosis (HCC) 04/04/2019  . Vitamin D deficiency 03/19/2019  . Real time reverse transcriptase PCR positive for COVID-19 virus 02/13/2019  . Poor fluid intake 02/13/2019  . Alteration in cognition 02/13/2019  . Urinary tract infection due to Proteus 01/17/2019  . Pneumonia due to COVID-19 virus 01/13/2019  . FTT (failure to thrive) in adult 10/05/2018  . Chronic constipation 10/05/2018  . Cellulitis of left lower extremity 07/07/2018  . Pneumonia of lower lobe of lung 02/09/2018  . Delirium 02/09/2018  . Chronic diastolic (congestive) heart failure (HCC) 02/09/2018  . Hematoma of left lower extremity 02/09/2018  . Acute on chronic diastolic CHF (congestive heart failure) (HCC) 01/30/2018  . Sepsis due to pneumonia (HCC) 01/30/2018  . Agitation 01/30/2018  . Right leg injury, subsequent encounter 01/30/2018  . Benign hypertension with chronic kidney disease, stage III 10/06/2017  . CKD (chronic kidney disease), stage III 10/06/2017  . Restrictive airway disease 10/06/2017  . Fall 09/29/2017  . Acute renal failure superimposed on stage 3 chronic kidney disease (HCC) 09/29/2017  . Bilateral lower extremity edema 08/15/2015  . Paroxysmal supraventricular tachycardia (HCC) 12/06/2013  . Essential hypertension, benign 12/06/2013  . Syncope and collapse 12/06/2013    CMP     Component Value Date/Time   NA 144 04/13/2019 0000   K 4.2 04/13/2019 0000   CL 114 (H) 04/05/2019 0433   CO2 23 04/05/2019 0433   GLUCOSE 77 04/05/2019 0433   BUN 20 04/13/2019 0000   CREATININE 0.9 04/13/2019 0000   CREATININE 0.79 04/05/2019 0433   CREATININE 1.07 (H)  08/15/2015 1422   CALCIUM 8.6 (L) 04/05/2019 0433   PROT 6.1 (L) 04/04/2019 0629   ALBUMIN 2.4 (L) 04/04/2019 0629   AST 13 (L) 04/04/2019 0629   ALT 7 04/04/2019 0629   ALKPHOS 69 04/04/2019 0629   BILITOT 1.2 04/04/2019 0629   GFRNONAA >60 04/05/2019 0433   GFRAA >60 04/05/2019 0433   Recent Labs    04/04/19 0629 04/05/19 0433 04/07/19 0000 04/13/19 0000  NA 145 148* 148* 144  K 4.3 3.4* 3.6 4.2  CL 113* 114*  --   --   CO2 23 23  --   --   GLUCOSE 94 77  --   --   BUN 21 19 20 20   CREATININE 0.78 0.79 0.6 0.9  CALCIUM 8.4* 8.6*  --   --    Recent Labs    04/04/19 0629  AST 13*  ALT  7  ALKPHOS 69  BILITOT 1.2  PROT 6.1*  ALBUMIN 2.4*   Recent Labs    04/04/19 0629 04/05/19 0433 04/06/19 0449 04/07/19 0000 04/13/19 0000  WBC 8.4 8.1 7.6 6.8 6.1  NEUTROABS 6.0  --   --  5 4  HGB 13.7 12.9 11.5* 11.7* 11.7*  HCT 44.6 41.1 37.5 36 35*  MCV 100.7* 99.0 100.3*  --   --   PLT 407* 370 354 333 376   No results for input(s): CHOL, LDLCALC, TRIG in the last 8760 hours.  Invalid input(s): HCL No results found for: [facility] Lab Results  Component Value Date   TSH 4.553 (H) 01/30/2018   No results found for: HGBA1C Lab Results  Component Value Date   CHOL 167 08/15/2015   HDL 65 08/15/2015   LDLCALC 87 08/15/2015   TRIG 76 08/15/2015   CHOLHDL 2.6 08/15/2015    Significant Diagnostic Results in last 30 days:  No results found.  Assessment and Plan  Pressure ulcer causing pain with dressing change-we will start Norco 5 mg 30 minutes before dressing change to alleviate pressure pain    Margit HanksAnne D Lorilynn Lehr, MD

## 2019-06-17 ENCOUNTER — Encounter: Payer: Self-pay | Admitting: Internal Medicine

## 2019-06-21 ENCOUNTER — Encounter: Payer: Self-pay | Admitting: Internal Medicine

## 2019-06-21 ENCOUNTER — Non-Acute Institutional Stay (SKILLED_NURSING_FACILITY): Payer: Medicare Other | Admitting: Internal Medicine

## 2019-06-21 DIAGNOSIS — L89159 Pressure ulcer of sacral region, unspecified stage: Secondary | ICD-10-CM

## 2019-06-21 DIAGNOSIS — S51812D Laceration without foreign body of left forearm, subsequent encounter: Secondary | ICD-10-CM | POA: Diagnosis not present

## 2019-06-21 NOTE — Progress Notes (Signed)
Location:  [facility] Room Number: 205-D Place of Service:  SNF [phone]) Provider:  Edmon Crape, PA-C   Patient Care Team: Margit Hanks, MD as PCP - General (Internal Medicine)  Extended Emergency Contact Information Primary Emergency Contact: McNeely,Richard Address: [address]          [place], [place] [address] [place] Home Phone: [phone] Relation: Brother  Code Status:  DNR Goals of care: Advanced Directive information Advanced Directives 06/21/2019  Does Patient Have a Medical Advance Directive? Yes  Type of Advance Directive Out of facility DNR (pink MOST or yellow form)  Does patient want to make changes to medical advance directive? No - Patient declined  Would patient like information on creating a medical advance directive? -  Pre-existing out of facility DNR order (yellow form or pink MOST form) Yellow form placed in chart (order not valid for inpatient use)     Chief Complaint  Patient presents with  . Acute Visit    Patient is seen for a skin tear of her left forearm    HPI:  Pt is a 85 y.o. female seen today for an acute visit for nursing reports of a skin tear on her right arm-patient is a fragile individual with fragile skin issues and apparently sustained a small tear on her right arm-at 1 point this weekend possible some drainage but I did reassess it today with the wound care nurse and this appears at this point to be quite benign-I do not really see any sign of infection with surrounding erythema or drainage or bleeding-.  Wound care nurse will apply antibiotic ointment protective dressing and monitor.  Patient cannot give any review of systems secondary to significant dementia.     Past Medical History:  Diagnosis Date  . Acne rosacea   . Acute pulmonary embolism (HCC) 04/04/2019  . CKD (chronic kidney disease), stage III   . Depression   . Hypertension   . Osteopenia   . Pneumonia due to COVID-19 virus  01/13/2019  . PUD (peptic ulcer disease)   . Restrictive lung disease   . Shingles   . Tinnitus    and vertigo   Past Surgical History:  Procedure Laterality Date  . None      No Known Allergies  Outpatient Encounter Medications as of 06/21/2019  Medication Sig  . Amino Acids-Protein Hydrolys (FEEDING SUPPLEMENT, PRO-STAT SUGAR FREE 64,) LIQD Take 30 mLs by mouth 2 (two) times daily.  Marland Kitchen apixaban (ELIQUIS) 5 MG TABS tablet Take 1 tablet (5 mg total) by mouth 2 (two) times daily.  . cetaphil (CETAPHIL) lotion Apply 1 application topically at bedtime. Apply to bilateral lower extremities  . Cholecalciferol (VITAMIN D3) 1.25 MG (50000 UT) CAPS Take 1 capsule by mouth once a week.   . Ensure (ENSURE) Take 237 mLs by mouth 3 (three) times daily.  Marland Kitchen HYDROcodone-acetaminophen (NORCO) 5-325 MG tablet Take 1 tablet by mouth every morning.  Marland Kitchen LORazepam (ATIVAN) 2 MG/ML concentrated solution 0.5 mg 2 (two) times daily as needed for anxiety. Apply topically  . metoprolol succinate (TOPROL-XL) 25 MG 24 hr tablet Take 25 mg by mouth daily.   . mirtazapine (REMERON) 15 MG tablet Take 7.5 mg by mouth at bedtime. Take 0.5 tablet to = 7.5 mg  . polyethylene glycol (MIRALAX / GLYCOLAX) packet Take 17 g by mouth every evening.   . potassium chloride (K-DUR,KLOR-CON) 10 MEQ tablet Take 10 mEq by mouth every other day.   Marland Kitchen  vitamin C (ASCORBIC ACID) 500 MG tablet Take 500 mg by mouth daily.  Marland Kitchen zinc sulfate 220 (50 Zn) MG capsule Take 220 mg by mouth every other day.    No facility-administered encounter medications on file as of 06/21/2019.    Review of Systems   Unobtainable secondary to dementia  Immunization History  Administered Date(s) Administered  . Influenza, High Dose Seasonal PF 03/31/2017  . Influenza-Unspecified 03/17/2018, 04/01/2019   Pertinent  Health Maintenance Due  Topic Date Due  . PNA vac Low Risk Adult (1 of 2 - PCV13) 12/28/1988  . INFLUENZA VACCINE  Completed  . DEXA SCAN   Discontinued   Fall Risk  02/01/2019  Falls in the past year? (No Data)  Comment Emmi Telephone Survey: data to providers prior to load  Number falls in past yr: (No Data)  Comment Emmi Telephone Survey Actual Response =    Functional Status Survey:    Vitals:   06/21/19 1423  Weight: 90 lb 1.6 oz (40.9 kg)  Height: 5\' 4"  (1.626 m)  Temperature is 98.0 pulse 76 respirations 19 blood pressure 146/76 oxygen saturation 96% on room air Body mass index is 15.47 kg/m. Physical Exam   In general this is a frail elderly female in no distress she does have significant dementia.  Her skin is warm and dry has some chronic bruising on left forearm there is a small skin tear with an erythematous bed I do not see any drainage bleeding or surrounding erythema or edema that is concerning for cellulitis. She does have a sacral decubitus wound which is followed by wound care as well-this was not assessed secondary to patient positioning and area covered previously by wound care nurse  Chest is clear to auscultation with poor respiratory effort there is no labored breathing.  Heart is regular rate and rhythm without murmur gallop or rub she continues to have a small amount of mild right foot edema.  Abdomen is soft nontender with positive bowel sounds.  Musculoskeletal continues to hold her lower extremities and contracted position this is baseline appears to move her upper extremities at baseline she does not really follow verbal commands.  Neurologic she is alert and does make eye contact and speaks a little bit.  Psych findings consistent with dementia she is not agitated with exam   June 04, 2019.  Sodium 143 potassium 4 BUN 14.3 creatinine 0.71   Labs reviewed: Recent Labs    03/16/19 0000 04/04/19 0629 04/05/19 0433 04/07/19 0000 04/13/19 0000 05/04/19 0000  NA  --  145 148* 148* 144 143  K   < > 4.3 3.4* 3.6 4.2 3.8  CL  --  113* 114*  --   --  111*  CO2  --  23 23  --    --  27*  GLUCOSE  --  94 77  --   --   --   BUN  --  21 19 20 20 16   CREATININE  --  0.78 0.79 0.6 0.9 0.7  CALCIUM  --  8.4* 8.6*  --   --  8.2*   < > = values in this interval not displayed.   Recent Labs    04/04/19 0629  AST 13*  ALT 7  ALKPHOS 69  BILITOT 1.2  PROT 6.1*  ALBUMIN 2.4*   Recent Labs    04/04/19 0629 04/05/19 0433 04/06/19 0449 04/07/19 0000 04/13/19 0000  WBC 8.4 8.1 7.6 6.8 6.1  NEUTROABS 6.0  --   --  5 4  HGB 13.7 12.9 11.5* 11.7* 11.7*  HCT 44.6 41.1 37.5 36 35*  MCV 100.7* 99.0 100.3*  --   --   PLT 407* 370 354 333 376   Lab Results  Component Value Date   TSH 4.553 (H) 01/30/2018   No results found for: HGBA1C Lab Results  Component Value Date   CHOL 167 08/15/2015   HDL 65 08/15/2015   LDLCALC 87 08/15/2015   TRIG 76 08/15/2015   CHOLHDL 2.6 08/15/2015    Significant Diagnostic Results in last 30 days:  No results found.  Assessment/Plan  #1 skin tear to arm-at this point appears fairly benign as noted above wound care nurse will apply topical antibiotic and monitor for any changes at this point no sign of infection.   #2 history of pressure ulcer that was causing pain with her dressing changes of the sacral ulcer-she was recently started on Norco before dressing change and apparently this is helping-nursing does not report any concerns today.  [id number]     Edmon CrapeArlo Evarose Altland, PA-C [phone]

## 2019-06-26 ENCOUNTER — Encounter: Payer: Self-pay | Admitting: Internal Medicine

## 2019-07-05 ENCOUNTER — Other Ambulatory Visit: Payer: Self-pay | Admitting: Internal Medicine

## 2019-07-06 LAB — COMPREHENSIVE METABOLIC PANEL: Albumin: 2.6 — AB (ref 3.5–5.0)

## 2019-07-08 ENCOUNTER — Non-Acute Institutional Stay (SKILLED_NURSING_FACILITY): Payer: Medicare Other | Admitting: Internal Medicine

## 2019-07-08 ENCOUNTER — Encounter: Payer: Self-pay | Admitting: Internal Medicine

## 2019-07-08 DIAGNOSIS — R627 Adult failure to thrive: Secondary | ICD-10-CM

## 2019-07-08 DIAGNOSIS — R4189 Other symptoms and signs involving cognitive functions and awareness: Secondary | ICD-10-CM

## 2019-07-08 DIAGNOSIS — G309 Alzheimer's disease, unspecified: Secondary | ICD-10-CM | POA: Diagnosis not present

## 2019-07-08 DIAGNOSIS — F0280 Dementia in other diseases classified elsewhere without behavioral disturbance: Secondary | ICD-10-CM

## 2019-07-08 MED ORDER — MORPHINE SULFATE (CONCENTRATE) 20 MG/ML PO SOLN: 5.0000 mg | ORAL | 0 refills | Status: DC | PRN

## 2019-07-08 NOTE — Progress Notes (Signed)
Location:  [facility] Room Number: 205-D Place of Service:  SNF [phone]) Provider:  Edmon Crape   Patient Care Team: Margit Hanks, MD as PCP - General (Internal Medicine)  Extended Emergency Contact Information Primary Emergency Contact: McNeely,Richard Address: [address]          [place], [place] [address] [place] Phone: [phone] Relation: Brother  Code Status:  DNR Goals of care: Advanced Directive information Advanced Directives 07/08/2019  Does Patient Have a Medical Advance Directive? Yes  Type of Advance Directive Out of facility DNR (pink MOST or yellow form)  Does patient want to make changes to medical advance directive? No - Patient declined  Would patient like information on creating a medical advance directive? -  Pre-existing out of facility DNR order (yellow form or pink MOST form) Yellow form placed in chart (order not valid for inpatient use)     Chief Complaint  Patient presents with  . Acute Visit    Patient seen for decline in status.     HPI:  Pt is a 86 y.o. female seen today for an acute visit for gradual decline in status with failure to thrive.  Patient is a long-term resident of the facility with a history of progressive dementia as well as chronic kidney disease-tachycardia history of pulmonary embolism on Eliquis as well as hypertension constipation and CHF and history of hypernatremia in the past.  Patient has significant end-stage dementia and apparently has not been eating or drinking or responding much today.  Per nursing staff there has been a gradual decline. But today appears to have accelerated somewhat.  Vital signs reveal a systolic blood pressure of 98-otherwise appear to be stable although it is difficult to get an oxygen saturation on her.-Blood sugar was 182-she is not considered diabetic  She does not appear to be in any distress but does not respond -- there is no sign of labored  breathing or discomfort at this time however.      Past Medical History:  Diagnosis Date  . Acne rosacea   . Acute pulmonary embolism (HCC) 04/04/2019  . CKD (chronic kidney disease), stage III   . Depression   . Hypertension   . Osteopenia   . Pneumonia due to COVID-19 virus 01/13/2019  . PUD (peptic ulcer disease)   . Restrictive lung disease   . Shingles   . Tinnitus    and vertigo   Past Surgical History:  Procedure Laterality Date  . None      No Known Allergies  Outpatient Encounter Medications as of 07/08/2019  Medication Sig  . Amino Acids-Protein Hydrolys (FEEDING SUPPLEMENT, PRO-STAT SUGAR FREE 64,) LIQD Take 30 mLs by mouth 2 (two) times daily.  . cetaphil (CETAPHIL) lotion Apply 1 application topically at bedtime. Apply to bilateral lower extremities  . Cholecalciferol (VITAMIN D3) 1.25 MG (50000 UT) CAPS Take 1 capsule by mouth once a week.   . Ensure (ENSURE) Take 237 mLs by mouth 3 (three) times daily.  Marland Kitchen LORazepam (ATIVAN) 2 MG/ML concentrated solution 0.5 mg daily. Apply topically   . morphine (ROXANOL) 20 MG/ML concentrated solution Take 5 mg by mouth every 4 (four) hours as needed for severe pain. Given 0.25 mL to = 5 mg  . polyethylene glycol (MIRALAX / GLYCOLAX) packet Take 17 g by mouth every evening.   . vitamin C (ASCORBIC ACID) 500 MG tablet Take 500 mg by mouth daily.  Marland Kitchen zinc sulfate 220 (50  Zn) MG capsule Take 220 mg by mouth every other day.   . [DISCONTINUED] apixaban (ELIQUIS) 5 MG TABS tablet Take 1 tablet (5 mg total) by mouth 2 (two) times daily.  . [DISCONTINUED] HYDROcodone-acetaminophen (NORCO) 5-325 MG tablet Take 1 tablet by mouth every morning.  . [DISCONTINUED] metoprolol succinate (TOPROL-XL) 25 MG 24 hr tablet Take 25 mg by mouth daily.   . [DISCONTINUED] mirtazapine (REMERON) 15 MG tablet Take 7.5 mg by mouth at bedtime. Take 0.5 tablet to = 7.5 mg  . [DISCONTINUED] potassium chloride (K-DUR,KLOR-CON) 10 MEQ tablet Take 10 mEq by  mouth every other day.    No facility-administered encounter medications on file as of 07/08/2019.    Review of Systems   This is unobtainable secondary to dementia patient not responding  Immunization History  Administered Date(s) Administered  . Influenza, High Dose Seasonal PF 03/31/2017  . Influenza-Unspecified 03/17/2018, 04/01/2019   Pertinent  Health Maintenance Due  Topic Date Due  . PNA vac Low Risk Adult (1 of 2 - PCV13) 12/28/1988  . INFLUENZA VACCINE  Completed  . DEXA SCAN  Discontinued   Fall Risk  02/01/2019  Falls in the past year? (No Data)  Comment Emmi Telephone Survey: data to providers prior to load  Number falls in past yr: (No Data)  Comment Emmi Telephone Survey Actual Response =    Functional Status Survey:    Vitals:   07/08/19 1521  BP: (!) 101/59  Pulse: 66  Resp: 17  Temp: (!) 97.1 F (36.2 C)  TempSrc: Oral  Weight: 90 lb 1.6 oz (40.9 kg)  Height: 5\' 4"  (1.626 m)  Update blood pressure was 98/50 Body mass index is 15.47 kg/m. Physical Exam   In general this is a frail elderly female she does not appear to be in any distress but does not really respond-.  Her skin is warm and dry with a history of some chronic bruising.  Eyes patient tends to hold her eyes shut quite tightly but they appear to be reactive with clear conjunctivo-.  Oropharynx could not really be assessed since patient would not open her mouth.  Chest she does not follow any verbal commands-breathing does not appear to be labored however and could not appreciate congestion.  Heart distant heart sounds from what I could ascertain was regular rate and rhythm.-Radial pulse was regular in the 60s  She does not have significant edema except some baseline right lower extremity pedal edema.  Abdomen is soft does not appear to be tender there are positive bowel sounds.  Musculoskeletal again does not really follow verbal commands she does have lower extremity weakness and  appears to have muscle tone intact of her upper extremities.  Neurologic as noted above she is not really very responsive.    Labs reviewed: Recent Labs    04/04/19 0629 04/04/19 0629 04/05/19 0433 04/13/19 0000 05/04/19 0000 06/04/19 0000  NA 145   < > 148* 144 143 143  K 4.3  --  3.4* 4.2 3.8 4.0  CL 113*  --  114*  --  111* 107  CO2 23  --  23  --  27* 23*  GLUCOSE 94  --  77  --   --   --   BUN 21   < > 19 20 16 14   CREATININE 0.78   < > 0.79 0.9 0.7 0.7  CALCIUM 8.4*  --  8.6*  --  8.2* 8.8   < > = values in  this interval not displayed.   Recent Labs    04/04/19 0629 07/06/19 0000  AST 13*  --   ALT 7  --   ALKPHOS 69  --   BILITOT 1.2  --   PROT 6.1*  --   ALBUMIN 2.4* 2.6*   Recent Labs    04/04/19 0629 04/05/19 0433 04/06/19 0449 04/07/19 0000 04/13/19 0000  WBC 8.4 8.1 7.6 6.8 6.1  NEUTROABS 6.0  --   --  5 4  HGB 13.7 12.9 11.5* 11.7* 11.7*  HCT 44.6 41.1 37.5 36 35*  MCV 100.7* 99.0 100.3*  --   --   PLT 407* 370 354 333 376   Lab Results  Component Value Date   TSH 4.553 (H) 01/30/2018   No results found for: HGBA1C Lab Results  Component Value Date   CHOL 167 08/15/2015   HDL 65 08/15/2015   LDLCALC 87 08/15/2015   TRIG 76 08/15/2015   CHOLHDL 2.6 08/15/2015    Significant Diagnostic Results in last 30 days:  No results found.  Assessment/Plan  Assessment and plan-patient appears to be having a decline in status-I did discuss this with her brother who is power of attorney.  Per discussion with her brother Delfino Lovett-- will pursue comfort care measures with no hospitalization no aggressive interventions including no antibiotics no IVs no further labs or investigational studies.  Emphasis will be on comfort will write an order for morphine 5 mg every 4 hours as needed for pain.  We will discontinue her oral medications since she does not appear capable of taking these that would include her Eliquis as well as Remeron which she was on  for appetite stimulation low-dose potassium and Lopressor with her history of tachycardia.  She does continue with Ativan gel.  At this point again emphasis will be on comfort per discussion with her brother Delfino Lovett.  [id number] note greater than 35 minutes spent assessing patient-discussing her status with nursing staff-reviewing her chart labs and medications-and discussing her status with brother Richard  via phone.        Granville Lewis, PA-C [phone]

## 2019-07-11 ENCOUNTER — Encounter: Payer: Self-pay | Admitting: Internal Medicine

## 2019-07-13 ENCOUNTER — Other Ambulatory Visit: Payer: Self-pay | Admitting: Internal Medicine

## 2019-07-13 ENCOUNTER — Non-Acute Institutional Stay (SKILLED_NURSING_FACILITY): Payer: Medicare Other | Admitting: Internal Medicine

## 2019-07-13 ENCOUNTER — Encounter: Payer: Self-pay | Admitting: Internal Medicine

## 2019-07-13 DIAGNOSIS — F03C0 Unspecified dementia, severe, without behavioral disturbance, psychotic disturbance, mood disturbance, and anxiety: Secondary | ICD-10-CM

## 2019-07-13 DIAGNOSIS — Z515 Encounter for palliative care: Secondary | ICD-10-CM | POA: Diagnosis not present

## 2019-07-13 DIAGNOSIS — F0390 Unspecified dementia without behavioral disturbance: Secondary | ICD-10-CM

## 2019-07-13 MED ORDER — MORPHINE SULFATE (CONCENTRATE) 20 MG/ML PO SOLN: 5.0000 mg | ORAL | 0 refills | Status: AC

## 2019-07-13 NOTE — Progress Notes (Signed)
Location:  [facility] Room Number: 205-D Place of Service:  SNF (31)  Margit Hanks, MD  Patient Care Team: Margit Hanks, MD as PCP - General (Internal Medicine)  Extended Emergency Contact Information Primary Emergency Contact: McNeely,Richard Address: [address]          [place], [place] [address] [place] Home Phone: [phone] Relation: Brother    Allergies: Patient has no known allergies.  Chief Complaint  Patient presents with  . Acute Visit    Patient seen for end of life care.    HPI: Patient is 86 y.o. female who nursing asked me to see for pain control.  Patient is in the process of dying, and she is on morphine 5 mg every 4 hours scheduled but every time she is touched she yells out and when she is undergoing wound care it is worse.  On examination patient does seem extremely worried and unsettled.  Past Medical History:  Diagnosis Date  . Acne rosacea   . Acute pulmonary embolism (HCC) 04/04/2019  . CKD (chronic kidney disease), stage III   . Depression   . Hypertension   . Osteopenia   . Pneumonia due to COVID-19 virus 01/13/2019  . PUD (peptic ulcer disease)   . Restrictive lung disease   . Shingles   . Tinnitus    and vertigo    Past Surgical History:  Procedure Laterality Date  . None      Allergies as of 07/13/2019   No Known Allergies     Medication List       Accurate as of July 13, 2019  8:48 PM. If you have any questions, ask your nurse or doctor.        STOP taking these medications   LORazepam 2 MG/ML concentrated solution Commonly known as: ATIVAN Stopped by: Merrilee Seashore, MD     TAKE these medications   cetaphil lotion Apply 1 application topically at bedtime. Apply to bilateral lower extremities   Ensure Take 237 mLs by mouth 3 (three) times daily.   feeding supplement (PRO-STAT SUGAR FREE 64) Liqd Take 30 mLs by mouth 2 (two) times daily.   morphine 20  MG/ML concentrated solution Commonly known as: ROXANOL Take 0.25 mLs (5 mg total) by mouth every 4 (four) hours. Given 0.25 mL to = 5 mg What changed:   when to take this  reasons to take this Changed by: Merrilee Seashore, MD   OXYGEN Inhale into the lungs as needed (Comfort).   polyethylene glycol 17 g packet Commonly known as: MIRALAX / GLYCOLAX Take 17 g by mouth every evening.   vitamin C 500 MG tablet Commonly known as: ASCORBIC ACID Take 500 mg by mouth daily.   Vitamin D3 1.25 MG (50000 UT) Caps Take 1 capsule by mouth once a week.   zinc sulfate 220 (50 Zn) MG capsule Take 220 mg by mouth every other day.       No orders of the defined types were placed in this encounter.   Immunization History  Administered Date(s) Administered  . Influenza, High Dose Seasonal PF 03/31/2017  . Influenza-Unspecified 03/17/2018, 04/01/2019    Social History   Tobacco Use  . Smoking status: Never Smoker  . Smokeless tobacco: Never Used  Substance Use Topics  . Alcohol use: No    Review of Systems unable to obtain secondary to dementia; nursing as per history of present illness  Vitals:   07/13/19 1458  BP: (!) 101/59  Pulse: 66  Resp: 17  Temp: (!) 97.1 F (36.2 C)   Body mass index is 15.47 kg/m. Physical Exam  GENERAL APPEARANCE: Alert, minimally conversant, some agitation SKIN: No diaphoresis rash HEENT: Unremarkable RESPIRATORY: Breathing is even, unlabored. Lung sounds are clear   CARDIOVASCULAR: Heart RRR no murmurs, rubs or gallops. No peripheral edema  GASTROINTESTINAL: Abdomen is soft, non-tender, not distended w/ normal bowel sounds.  GENITOURINARY: Bladder non tender, not distended  MUSCULOSKELETAL: No abnormal joints or musculature except very thin NEUROLOGIC: Cranial nerves 2-12 grossly intact. Moves all extremities PSYCHIATRIC: End stage dementia; patient with some agitation and worry  Patient Active Problem List   Diagnosis Date Noted    . Pressure injury of skin 04/06/2019  . Acute pulmonary embolism ([place]) 04/04/2019  . Acute hypoxemic respiratory failure ([place]) 04/04/2019  . Idiopathic pulmonary fibrosis ([place]) 04/04/2019  . Vitamin D deficiency 03/19/2019  . Real time reverse transcriptase PCR positive for COVID-19 virus 02/13/2019  . Poor fluid intake 02/13/2019  . Alteration in cognition 02/13/2019  . Urinary tract infection due to Proteus 01/17/2019  . Pneumonia due to COVID-19 virus 01/13/2019  . FTT (failure to thrive) in adult 10/05/2018  . Chronic constipation 10/05/2018  . Cellulitis of left lower extremity 07/07/2018  . Pneumonia of lower lobe of lung 02/09/2018  . Delirium 02/09/2018  . Chronic diastolic (congestive) heart failure ([place]) 02/09/2018  . Hematoma of left lower extremity 02/09/2018  . Acute on chronic diastolic CHF (congestive heart failure) ([place]) 01/30/2018  . Sepsis due to pneumonia ([place]) 01/30/2018  . Agitation 01/30/2018  . Right leg injury, subsequent encounter 01/30/2018  . Benign hypertension with chronic kidney disease, stage III 10/06/2017  . CKD (chronic kidney disease), stage III 10/06/2017  . Restrictive airway disease 10/06/2017  . Fall 09/29/2017  . Acute renal failure superimposed on stage 3 chronic kidney disease ([place]) 09/29/2017  . Bilateral lower extremity edema 08/15/2015  . Paroxysmal supraventricular tachycardia ([place]) 12/06/2013  . Essential hypertension, benign 12/06/2013  . Syncope and collapse 12/06/2013    CMP     Component Value Date/Time   NA 143 06/04/2019 0000   K 4.0 06/04/2019 0000   CL 107 06/04/2019 0000   CO2 23 (A) 06/04/2019 0000   GLUCOSE 77 04/05/2019 0433   BUN 14 06/04/2019 0000   CREATININE 0.7 06/04/2019 0000   CREATININE 0.79 04/05/2019 0433   CREATININE 1.07 (H) 08/15/2015 1422   CALCIUM 8.8 06/04/2019 0000   PROT 6.1 (L) 04/04/2019 0629   ALBUMIN 2.6 (A) 07/06/2019 0000   AST 13 (L) 04/04/2019 0629   ALT 7 04/04/2019 0629   ALKPHOS  69 04/04/2019 0629   BILITOT 1.2 04/04/2019 0629   GFRNONAA 72.41 06/04/2019 0000   GFRAA 83.92 06/04/2019 0000   Recent Labs    04/04/19 0629 04/04/19 0629 04/05/19 0433 04/13/19 0000 05/04/19 0000 06/04/19 0000  NA 145   < > 148* 144 143 143  K 4.3  --  3.4* 4.2 3.8 4.0  CL 113*  --  114*  --  111* 107  CO2 23  --  23  --  27* 23*  GLUCOSE 94  --  77  --   --   --   BUN 21   < > 19 20 16 14   CREATININE 0.78   < > 0.79 0.9 0.7 0.7  CALCIUM 8.4*  --  8.6*  --  8.2* 8.8   < > =  values in this interval not displayed.   Recent Labs    04/04/19 0629 07/06/19 0000  AST 13*  --   ALT 7  --   ALKPHOS 69  --   BILITOT 1.2  --   PROT 6.1*  --   ALBUMIN 2.4* 2.6*   Recent Labs    04/04/19 0629 04/05/19 0433 04/06/19 0449 04/07/19 0000 04/13/19 0000  WBC 8.4 8.1 7.6 6.8 6.1  NEUTROABS 6.0  --   --  5 4  HGB 13.7 12.9 11.5* 11.7* 11.7*  HCT 44.6 41.1 37.5 36 35*  MCV 100.7* 99.0 100.3*  --   --   PLT 407* 370 354 333 376   No results for input(s): CHOL, LDLCALC, TRIG in the last 8760 hours.  Invalid input(s): HCL No results found for: [facility] Lab Results  Component Value Date   TSH 4.553 (H) 01/30/2018   No results found for: HGBA1C Lab Results  Component Value Date   CHOL 167 08/15/2015   HDL 65 08/15/2015   LDLCALC 87 08/15/2015   TRIG 76 08/15/2015   CHOLHDL 2.6 08/15/2015    Significant Diagnostic Results in last 30 days:  No results found.  Assessment and Plan  End-stage dementia/end-of-life care-we will increase morphine to 5 mg every 2 hours scheduled; continue supportive care    Margit Hanks, MD

## 2019-07-16 ENCOUNTER — Encounter: Payer: Self-pay | Admitting: Internal Medicine

## 2019-07-16 ENCOUNTER — Non-Acute Institutional Stay (SKILLED_NURSING_FACILITY): Payer: Medicare Other | Admitting: Internal Medicine

## 2019-07-16 DIAGNOSIS — I471 Supraventricular tachycardia: Secondary | ICD-10-CM

## 2019-07-16 DIAGNOSIS — I2699 Other pulmonary embolism without acute cor pulmonale: Secondary | ICD-10-CM | POA: Diagnosis not present

## 2019-07-16 DIAGNOSIS — R627 Adult failure to thrive: Secondary | ICD-10-CM

## 2019-07-16 DIAGNOSIS — I10 Essential (primary) hypertension: Secondary | ICD-10-CM | POA: Diagnosis not present

## 2019-07-16 NOTE — Progress Notes (Signed)
Location:    [facility] Room Number: 205/D Place of Service:  SNF (31) Provider:  Leda Min, MD  Patient Care Team: Hennie Duos, MD as PCP - General (Internal Medicine)  Extended Emergency Contact Information Primary Emergency Contact: McNeely,Richard Address: [address]          [place], [place] [address] [place] Phone: [phone] Relation: Brother  Code Status:  DNR Goals of care: Advanced Directive information Advanced Directives 07/16/2019  Does Patient Have a Medical Advance Directive? Yes  Type of Advance Directive Out of facility DNR (pink MOST or yellow form)  Does patient want to make changes to medical advance directive? No - Patient declined  Would patient like information on creating a medical advance directive? -  Pre-existing out of facility DNR order (yellow form or pink MOST form) Yellow form placed in chart (order not valid for inpatient use)    Chief complaint--routine visit to follow-up end-of-life care-with previous history of chronic kidney disease tachycardia pulmonary embolism hypertension CHF and hyponatremia in the past   HPI:  Pt is a 86 y.o. female seen today for medical management of chronic diseases as noted above.  Patient was seen recently for increased lethargy and unresponsiveness-and she is now essentially under comfort care orders with no aggressive interventions no hospitalizations IVs or antibiotics.  She is essentially on morphine for comfort.  She appears to be comfortable today she actually does open her eyes and talk some which was an improvement from when I last saw her.  Her p.o. meds have been discontinued including Eliquis which she was on for history of pulmonary embolism.--Lopressor for history of tachycardia--we have also discontinued her Remeron for appetite stimulation.  At this point appears to be comfortable-vital signs actually appear to  be stable nursing does not report any issues with pain or discomfort.    .     Past Medical History:  Diagnosis Date   Acne rosacea    Acute pulmonary embolism ([place]) 04/04/2019   CKD (chronic kidney disease), stage III    Depression    Hypertension    Osteopenia    Pneumonia due to COVID-19 virus 01/13/2019   PUD (peptic ulcer disease)    Restrictive lung disease    Shingles    Tinnitus    and vertigo   Past Surgical History:  Procedure Laterality Date   None      No Known Allergies  Allergies as of 07/16/2019   No Known Allergies     Medication List       Accurate as of July 16, 2019  9:39 AM. If you have any questions, ask your nurse or doctor.        cetaphil lotion Apply 1 application topically at bedtime. Apply to bilateral lower extremities   Ensure Take 237 mLs by mouth 3 (three) times daily.   feeding supplement (PRO-STAT SUGAR FREE 64) Liqd Take 30 mLs by mouth 2 (two) times daily.   morphine 20 MG/ML concentrated solution Commonly known as: ROXANOL Take 0.25 mLs (5 mg total) by mouth every 4 (four) hours. Given 0.25 mL to = 5 mg   OXYGEN Inhale into the lungs as needed (Comfort).   polyethylene glycol 17 g packet Commonly known as: MIRALAX / GLYCOLAX Take 17 g by mouth every evening.   vitamin C 500 MG tablet Commonly known as: ASCORBIC ACID Take 500 mg by mouth daily.  Vitamin D3 1.25 MG (50000 UT) Caps Take 1 capsule by mouth once a week.   zinc sulfate 220 (50 Zn) MG capsule Take 220 mg by mouth every other day.       Review of Systems  Is unobtainable secondary to severe dementia-please see HPI  Immunization History  Administered Date(s) Administered   Influenza, High Dose Seasonal PF 03/31/2017   Influenza-Unspecified 03/17/2018, 04/01/2019   Pertinent  Health Maintenance Due  Topic Date Due   PNA vac Low Risk Adult (1 of 2 - PCV13) 12/28/1988   INFLUENZA VACCINE  Completed   DEXA SCAN  Discontinued    Fall Risk  02/01/2019  Falls in the past year? (No Data)  Comment Emmi Telephone Survey: data to providers prior to load  Number falls in past yr: (No Data)  Comment Emmi Telephone Survey Actual Response =    Functional Status Survey:    Vitals:   07/16/19 0932  BP: (!) 101/59  Pulse: 66  Resp: 17  Temp: (!) 97.1 F (36.2 C)  TempSrc: Oral  SpO2: 98%  Weight: 90 lb 1.6 oz (40.9 kg)  Height: 5\' 4"  (1.626 m)   Body mass index is 15.47 kg/m. Physical Exam  In general this is a frail elderly female in no distress lying in bed-she is talking some nonsensically but this is more responsive than I saw her last visit.  Her skin is warm and dry she does have a history of a sacral ulcer which is followed by wound care.  Eyes appear to be clear she did not open her eyes much during my exam but she did open them at times.  Oropharynx could not be assessed because she held her mouth closely shot.  Chest shallow air entry could not appreciate any overt congestion she does not follow verbal commands there is no labored breathing.  Heart is regular rate and rhythm I got a pulse in the 90s she has mild right lower extremity pedal edema which is not new.  Abdomen is soft does not appear to be tender there are positive bowel sounds.  Musculoskeletal appears to be at baseline has lower extremity weakness Holter lower extremities in a somewhat contracted position appears to have some movement of her upper extremities although she does not follow verbal commands which makes this difficult.  Neurologic as noted above she is more responsive than she was when I last saw her.  Psych findings consistent with end-stage dementia  Labs reviewed: Recent Labs    04/04/19 0629 04/04/19 0629 04/05/19 0433 04/07/19 0000 04/13/19 0000 05/04/19 0000 06/04/19 0000  NA 145   < > 148*   < > 144 143 143  K 4.3   < > 3.4*   < > 4.2 3.8 4.0  CL 113*   < > 114*  --   --  111* 107  CO2 23   < > 23  --    --  27* 23*  GLUCOSE 94  --  77  --   --   --   --   BUN 21   < > 19   < > 20 16 14   CREATININE 0.78   < > 0.79   < > 0.9 0.7 0.7  CALCIUM 8.4*   < > 8.6*  --   --  8.2* 8.8   < > = values in this interval not displayed.   Recent Labs    04/04/19 0629 07/06/19 0000  AST 13*  --  ALT 7  --   ALKPHOS 69  --   BILITOT 1.2  --   PROT 6.1*  --   ALBUMIN 2.4* 2.6*   Recent Labs    04/04/19 0629 04/04/19 0629 04/05/19 0433 04/05/19 0433 04/06/19 0449 04/07/19 0000 04/13/19 0000  WBC 8.4   < > 8.1   < > 7.6 6.8 6.1  NEUTROABS 6.0  --   --   --   --  5 4  HGB 13.7   < > 12.9   < > 11.5* 11.7* 11.7*  HCT 44.6   < > 41.1   < > 37.5 36 35*  MCV 100.7*  --  99.0  --  100.3*  --   --   PLT 407*   < > 370   < > 354 333 376   < > = values in this interval not displayed.   Lab Results  Component Value Date   TSH 4.553 (H) 01/30/2018   No results found for: HGBA1C Lab Results  Component Value Date   CHOL 167 08/15/2015   HDL 65 08/15/2015   LDLCALC 87 08/15/2015   TRIG 76 08/15/2015   CHOLHDL 2.6 08/15/2015    Significant Diagnostic Results in last 30 days:  No results found.  Assessment/Plan  #1 failure to thrive with end-stage dementia-she continues under comfort care she does have orders for morphine for pain she appears to be comfortable at this point will monitor.  2.  History of pulmonary embolism as noted above her Eliquis has been discontinued secondary to comfort measures.  3.  History of tachycardia likewise her Lopressor has been discontinued pulse was in the 90s on exam today.  4.  Hypertension again her Toprol has been discontinued her blood pressure actually appears to be running on the lower side which is not surprising.  5.  History of hypernatremia again she is under comfort care with no further labs secondary to her continued decline with history of end-stage dementia.  [id number]

## 2019-07-18 ENCOUNTER — Encounter: Payer: Self-pay | Admitting: Internal Medicine

## 2019-07-24 ENCOUNTER — Encounter: Payer: Self-pay | Admitting: Internal Medicine

## 2019-07-24 DIAGNOSIS — F0390 Unspecified dementia without behavioral disturbance: Secondary | ICD-10-CM | POA: Insufficient documentation

## 2019-07-24 DIAGNOSIS — F03C0 Unspecified dementia, severe, without behavioral disturbance, psychotic disturbance, mood disturbance, and anxiety: Secondary | ICD-10-CM | POA: Insufficient documentation

## 2019-07-26 ENCOUNTER — Non-Acute Institutional Stay (SKILLED_NURSING_FACILITY): Payer: Medicare Other | Admitting: Internal Medicine

## 2019-07-26 DIAGNOSIS — Z515 Encounter for palliative care: Secondary | ICD-10-CM | POA: Diagnosis not present

## 2019-08-01 ENCOUNTER — Encounter: Payer: Self-pay | Admitting: Internal Medicine

## 2019-08-01 DIAGNOSIS — Z515 Encounter for palliative care: Secondary | ICD-10-CM | POA: Insufficient documentation

## 2019-08-02 NOTE — Progress Notes (Signed)
Location:   [facility]:   SNF  Sheppard Coil, Madeline Delaine, MD  Patient Care Team: Hennie Duos, MD as PCP - General (Internal Medicine)  Extended Emergency Contact Information Primary Emergency Contact: Madeline,Parrish Address: [address]          [place], [place] [address] [place] Phone: [phone] Relation: Brother    Allergies: Patient has no known allergies.  Chief Complaint  Patient presents with  . Acute Visit    HPI: Patient is 86 y.o. female who is being seen for pain control.  Patient is on morphine 5 mg every 2 hours scheduled but is still exhibiting quite a bit of agitation and also discomfort.  Patient is actively dying.  Past Medical History:  Diagnosis Date  . Acne rosacea   . Acute pulmonary embolism ([place]) 04/04/2019  . CKD (chronic kidney disease), stage III   . Depression   . Hypertension   . Osteopenia   . Pneumonia due to COVID-19 virus 01/13/2019  . PUD (peptic ulcer disease)   . Restrictive lung disease   . Shingles   . Tinnitus    and vertigo    Past Surgical History:  Procedure Laterality Date  . None      Allergies as of 07/26/2019   No Known Allergies     Medication List       Accurate as of July 26, 2019 11:59 PM. If you have any questions, ask your nurse or doctor.        cetaphil lotion Apply 1 application topically at bedtime. Apply to bilateral lower extremities   Ensure Take 237 mLs by mouth 3 (three) times daily.   feeding supplement (PRO-STAT SUGAR FREE 64) Liqd Take 30 mLs by mouth 2 (two) times daily.   morphine 20 MG/ML concentrated solution Commonly known as: ROXANOL Take 0.25 mLs (5 mg total) by mouth every 4 (four) hours. Given 0.25 mL to = 5 mg   OXYGEN Inhale into the lungs as needed (Comfort).   polyethylene glycol 17 g packet Commonly known as: MIRALAX / GLYCOLAX Take 17 g by mouth every evening.   vitamin C 500 MG tablet Commonly known as: ASCORBIC  ACID Take 500 mg by mouth daily.   Vitamin D3 1.25 MG (50000 UT) Caps Take 1 capsule by mouth once a week.   zinc sulfate 220 (50 Zn) MG capsule Take 220 mg by mouth every other day.       No orders of the defined types were placed in this encounter.   Immunization History  Administered Date(s) Administered  . Influenza, High Dose Seasonal PF 03/31/2017  . Influenza-Unspecified 03/17/2018, 04/01/2019    Social History   Tobacco Use  . Smoking status: Never Smoker  . Smokeless tobacco: Never Used  Substance Use Topics  . Alcohol use: No    Review of Systems unable to obtain secondary to dying status; nursing-as per history of present illness   Vitals:   August 23, 2019 1627  BP: 109/69  Pulse: 66  Resp: 17  Temp: (!) 97 F (36.1 C)   There is no height or weight on file to calculate BMI. Physical Exam  GENERAL APPEARANCE: Minimally responsive nonconversant, moderate distress SKIN: No diaphoresis rash HEENT: Unremarkable RESPIRATORY: Breathing is even, mild labored. Lung sounds are clear   CARDIOVASCULAR: Heart RRR no murmurs, rubs or gallops. No peripheral edema  GASTROINTESTINAL: Abdomen is soft, non-tender, not distended w/ normal bowel sounds.  GENITOURINARY:  Bladder non tender, not distended  MUSCULOSKELETAL: No abnormal joints or musculature NEUROLOGIC: Cranial nerves 2-12 grossly intact. PSYCHIATRIC: Not applicable  Patient Active Problem List   Diagnosis Date Noted  . Advanced dementia (HCC) 07/24/2019  . Pressure injury of skin 04/06/2019  . Acute pulmonary embolism (HCC) 04/04/2019  . Acute hypoxemic respiratory failure (HCC) 04/04/2019  . Idiopathic pulmonary fibrosis (HCC) 04/04/2019  . Vitamin D deficiency 03/19/2019  . Real time reverse transcriptase PCR positive for COVID-19 virus 02/13/2019  . Poor fluid intake 02/13/2019  . Alteration in cognition 02/13/2019  . Urinary tract infection due to Proteus 01/17/2019  . Pneumonia due to COVID-19  virus 01/13/2019  . FTT (failure to thrive) in adult 10/05/2018  . Chronic constipation 10/05/2018  . Cellulitis of left lower extremity 07/07/2018  . Pneumonia of lower lobe of lung 02/09/2018  . Delirium 02/09/2018  . Chronic diastolic (congestive) heart failure (HCC) 02/09/2018  . Hematoma of left lower extremity 02/09/2018  . Acute on chronic diastolic CHF (congestive heart failure) (HCC) 01/30/2018  . Sepsis due to pneumonia (HCC) 01/30/2018  . Agitation 01/30/2018  . Right leg injury, subsequent encounter 01/30/2018  . Benign hypertension with chronic kidney disease, stage III 10/06/2017  . CKD (chronic kidney disease), stage III 10/06/2017  . Restrictive airway disease 10/06/2017  . Fall 09/29/2017  . Acute renal failure superimposed on stage 3 chronic kidney disease (HCC) 09/29/2017  . Bilateral lower extremity edema 08/15/2015  . Paroxysmal supraventricular tachycardia (HCC) 12/06/2013  . Essential hypertension, benign 12/06/2013  . Syncope and collapse 12/06/2013    CMP     Component Value Date/Time   NA 143 06/04/2019 0000   K 4.0 06/04/2019 0000   CL 107 06/04/2019 0000   CO2 23 (A) 06/04/2019 0000   GLUCOSE 77 04/05/2019 0433   BUN 14 06/04/2019 0000   CREATININE 0.7 06/04/2019 0000   CREATININE 0.79 04/05/2019 0433   CREATININE 1.07 (H) 08/15/2015 1422   CALCIUM 8.8 06/04/2019 0000   PROT 6.1 (L) 04/04/2019 0629   ALBUMIN 2.6 (A) 07/06/2019 0000   AST 13 (L) 04/04/2019 0629   ALT 7 04/04/2019 0629   ALKPHOS 69 04/04/2019 0629   BILITOT 1.2 04/04/2019 0629   GFRNONAA 72.41 06/04/2019 0000   GFRAA 83.92 06/04/2019 0000   Recent Labs    04/04/19 0629 04/04/19 0629 04/05/19 0433 04/07/19 0000 04/13/19 0000 05/04/19 0000 06/04/19 0000  NA 145   < > 148*   < > 144 143 143  K 4.3   < > 3.4*   < > 4.2 3.8 4.0  CL 113*   < > 114*  --   --  111* 107  CO2 23   < > 23  --   --  27* 23*  GLUCOSE 94  --  77  --   --   --   --   BUN 21   < > 19   < > 20 16 14    CREATININE 0.78   < > 0.79   < > 0.9 0.7 0.7  CALCIUM 8.4*   < > 8.6*  --   --  8.2* 8.8   < > = values in this interval not displayed.   Recent Labs    04/04/19 0629 07/06/19 0000  AST 13*  --   ALT 7  --   ALKPHOS 69  --   BILITOT 1.2  --   PROT 6.1*  --   ALBUMIN 2.4* 2.6*   Recent  Labs    04/04/19 0629 04/04/19 0629 04/05/19 0433 04/05/19 0433 04/06/19 0449 04/07/19 0000 04/13/19 0000  WBC 8.4   < > 8.1   < > 7.6 6.8 6.1  NEUTROABS 6.0  --   --   --   --  5 4  HGB 13.7   < > 12.9   < > 11.5* 11.7* 11.7*  HCT 44.6   < > 41.1   < > 37.5 36 35*  MCV 100.7*  --  99.0  --  100.3*  --   --   PLT 407*   < > 370   < > 354 333 376   < > = values in this interval not displayed.   No results for input(s): CHOL, LDLCALC, TRIG in the last 8760 hours.  Invalid input(s): HCL No results found for: [facility] Lab Results  Component Value Date   TSH 4.553 (H) 01/30/2018   No results found for: HGBA1C Lab Results  Component Value Date   CHOL 167 08/15/2015   HDL 65 08/15/2015   LDLCALC 87 08/15/2015   TRIG 76 08/15/2015   CHOLHDL 2.6 08/15/2015    Significant Diagnostic Results in last 30 days:  No results found.  Assessment and Plan  Acutely dying/comfort care-we will increase morphine solution to 10 mg every 2 hours scheduled; continue to monitor    Margit Hanks, MD

## 2019-08-02 DEATH — deceased

## 2019-08-18 LAB — BLOOD GAS, ARTERIAL
Acid-Base Excess: 2.4 mmol/L — ABNORMAL HIGH (ref 0.0–2.0)
Bicarbonate: 25.4 mmol/L (ref 20.0–28.0)
O2 Saturation: 87.7 %
Patient temperature: 98.6
pCO2 arterial: 35.1 mmHg (ref 32.0–48.0)
pH, Arterial: 7.473 — ABNORMAL HIGH (ref 7.350–7.450)
pO2, Arterial: 54.4 mmHg — ABNORMAL LOW (ref 83.0–108.0)

## 2020-04-10 IMAGING — DX DG TIBIA/FIBULA 2V*L*
1 series · 4 of 4 positions shown · non-contrast
Comparison: None.

CLINICAL DATA: [AGE] female status post fall with left lower
tib fib laceration.

EXAM:
LEFT TIBIA AND FIBULA - 2 VIEW

[Series 1: leg · 0.14mm/px · 4 of 4 slices shown]
[im 1/4]
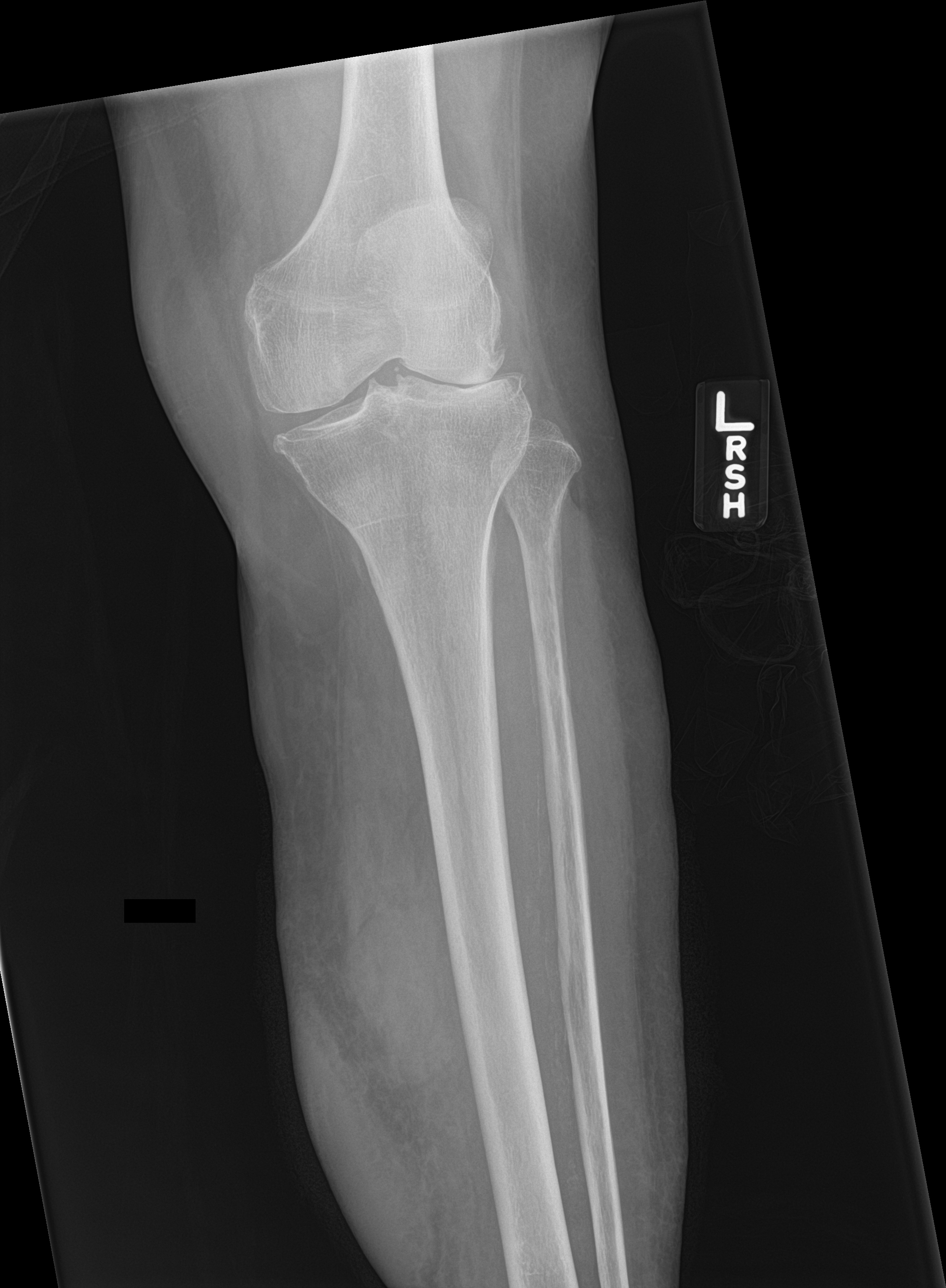
[im 2/4]
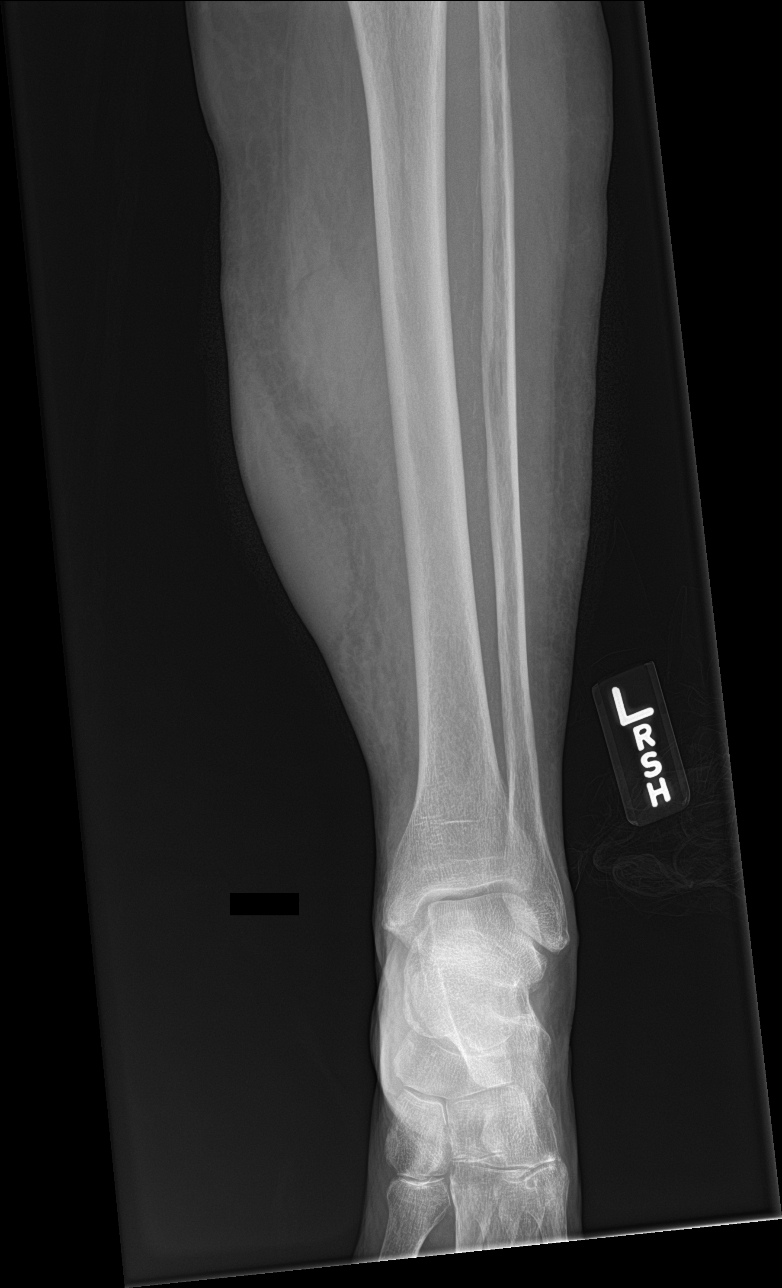
[im 3/4]
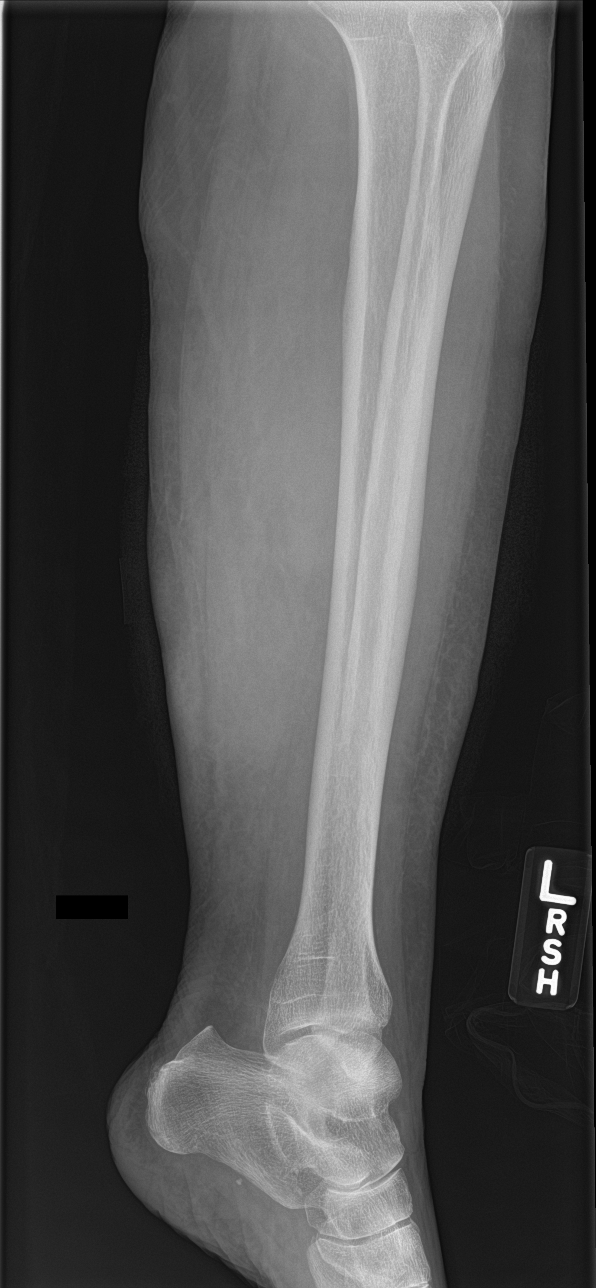
[im 4/4]
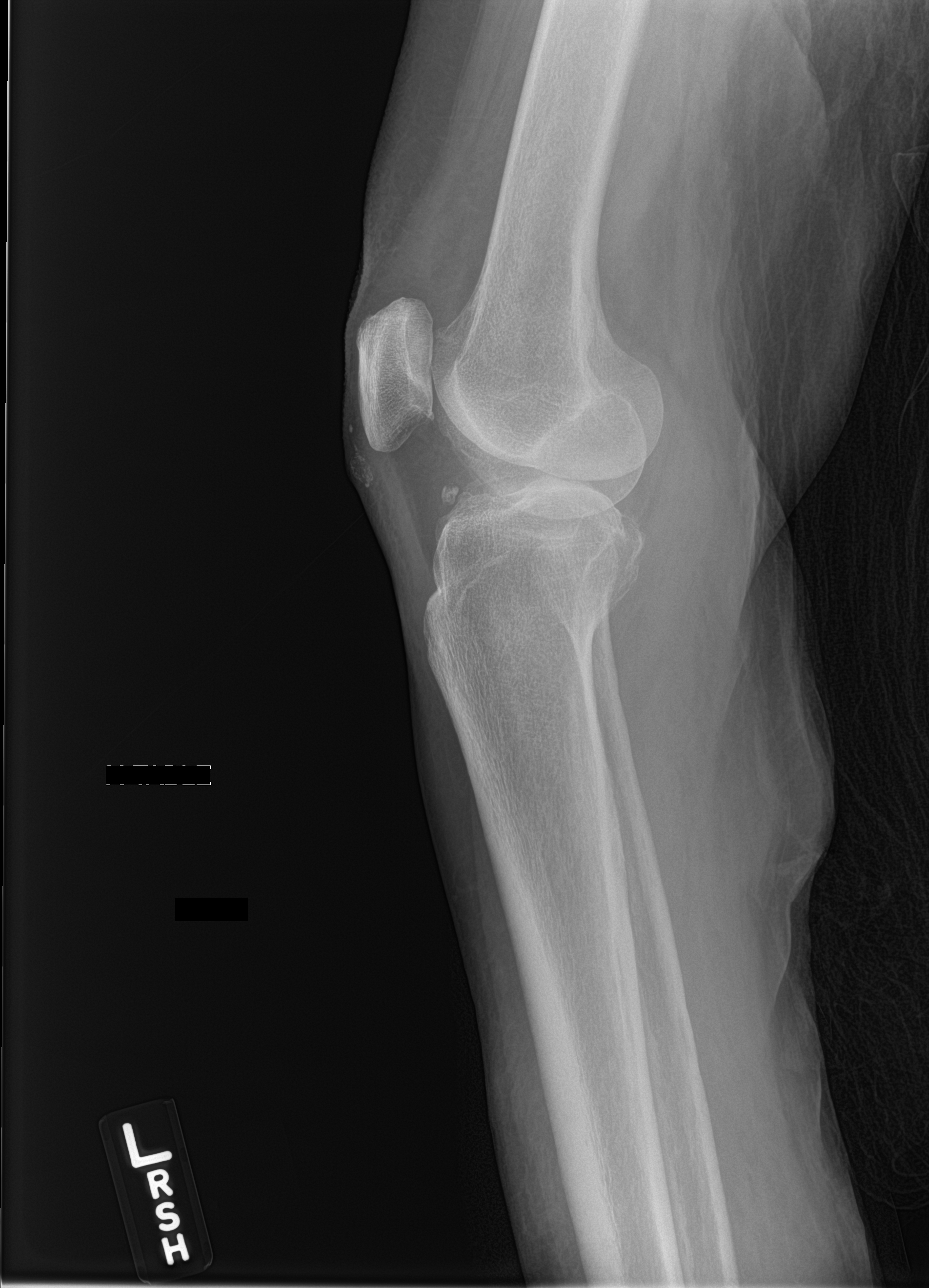

[4 of 4 positions shown; findings below may reference images not displayed]

FINDINGS: Bone mineralization is within normal limits for age. Alignment is
maintained at the left knee and ankle. Moderate to severe lateral
compartment knee joint space loss. Multicompartmental degenerative
spurring at the knee. The left tibia and fibula appear intact. No
acute osseous abnormality identified. Elongated 8-10 centimeter
areas of soft tissue lobulation and increased density along the
medial tib fib suggesting hematoma (image 2). No soft tissue gas.
IMPRESSION: Medial left lower extremity soft tissue hematoma suspected. No acute
osseous abnormality identified.

## 2021-06-14 IMAGING — DX DG CHEST 1V PORT
1 series · 1 of 1 positions shown · non-contrast
Comparison: 01/30/2018

CLINICAL DATA: Hypoxia

EXAM:
PORTABLE CHEST 1 VIEW

[chest ap]
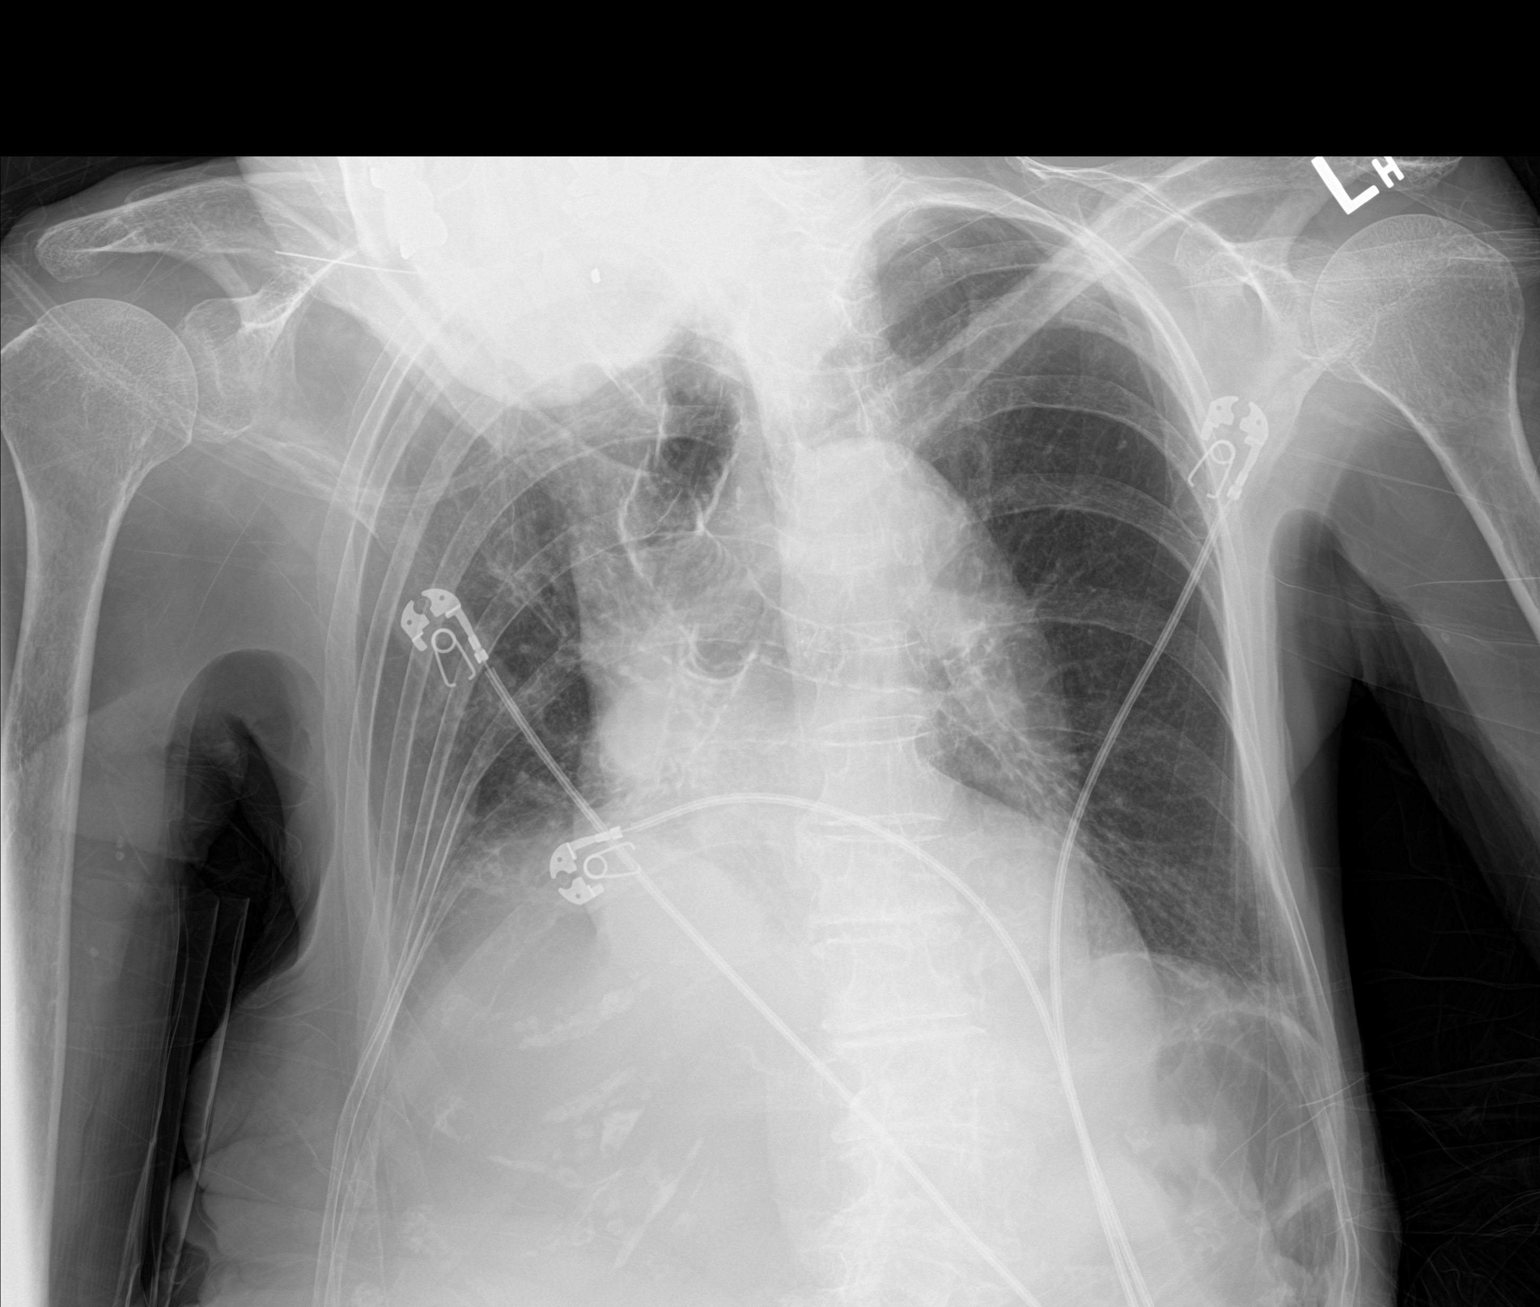

[1 of 1 positions shown; findings below may reference images not displayed]

FINDINGS: There is mild bilateral chronic interstitial thickening. There is no
focal consolidation. There is no pleural effusion or pneumothorax.
There is stable cardiomegaly. There is a 2 cm nodular area in the
right hilar region similar in appearance to the prior examination of
09/29/2017 which may reflect an enlarged lymph node.

There is no acute osseous abnormality.
IMPRESSION: No acute cardiopulmonary disease.

2 cm nodular area in the right hilar region similar in appearance to
the prior examination of 09/29/2017 which may reflect an enlarged
lymph node. If there is further clinical concern, recommend further
characterization with a non emergent CT of the chest.
